# Patient Record
Sex: Female | Born: 1940 | Race: White | Hispanic: No | Marital: Married | Smoking: Never smoker
Health system: Southern US, Community
[De-identification: ages and names within clinical notes are randomized; demographics above are authoritative.]

## PROBLEM LIST (undated history)

## (undated) DIAGNOSIS — D3A098 Benign carcinoid tumors of other sites: Secondary | ICD-10-CM

## (undated) DIAGNOSIS — T7840XA Allergy, unspecified, initial encounter: Secondary | ICD-10-CM

## (undated) DIAGNOSIS — G43909 Migraine, unspecified, not intractable, without status migrainosus: Secondary | ICD-10-CM

## (undated) DIAGNOSIS — E785 Hyperlipidemia, unspecified: Secondary | ICD-10-CM

## (undated) DIAGNOSIS — I10 Essential (primary) hypertension: Secondary | ICD-10-CM

## (undated) DIAGNOSIS — M1990 Unspecified osteoarthritis, unspecified site: Secondary | ICD-10-CM

## (undated) HISTORY — PX: CHOLECYSTECTOMY: SHX55

## (undated) HISTORY — DX: Unspecified osteoarthritis, unspecified site: M19.90

## (undated) HISTORY — DX: Hyperlipidemia, unspecified: E78.5

## (undated) HISTORY — DX: Allergy, unspecified, initial encounter: T78.40XA

## (undated) HISTORY — DX: Migraine, unspecified, not intractable, without status migrainosus: G43.909

## (undated) HISTORY — DX: Essential (primary) hypertension: I10

## (undated) HISTORY — DX: Benign carcinoid tumors of other sites: D3A.098

## (undated) HISTORY — PX: TUBAL LIGATION: SHX77

---

## 1998-10-05 ENCOUNTER — Other Ambulatory Visit: Admission: RE | Admit: 1998-10-05 | Discharge: 1998-10-05 | Payer: Self-pay | Admitting: *Deleted

## 1999-09-14 ENCOUNTER — Other Ambulatory Visit: Admission: RE | Admit: 1999-09-14 | Discharge: 1999-09-14 | Payer: Self-pay | Admitting: Family Medicine

## 2000-04-04 ENCOUNTER — Encounter: Payer: Self-pay | Admitting: Family Medicine

## 2000-04-04 ENCOUNTER — Encounter: Admission: RE | Admit: 2000-04-04 | Discharge: 2000-04-04 | Payer: Self-pay | Admitting: Family Medicine

## 2000-12-24 ENCOUNTER — Other Ambulatory Visit: Admission: RE | Admit: 2000-12-24 | Discharge: 2000-12-24 | Payer: Self-pay | Admitting: Obstetrics and Gynecology

## 2001-08-09 ENCOUNTER — Ambulatory Visit (HOSPITAL_COMMUNITY): Admission: RE | Admit: 2001-08-09 | Discharge: 2001-08-09 | Payer: Self-pay | Admitting: Family Medicine

## 2001-08-09 ENCOUNTER — Encounter: Payer: Self-pay | Admitting: Family Medicine

## 2001-09-06 ENCOUNTER — Encounter: Payer: Self-pay | Admitting: Gastroenterology

## 2001-09-06 ENCOUNTER — Ambulatory Visit (HOSPITAL_COMMUNITY): Admission: RE | Admit: 2001-09-06 | Discharge: 2001-09-06 | Payer: Self-pay | Admitting: Gastroenterology

## 2001-10-21 ENCOUNTER — Ambulatory Visit (HOSPITAL_COMMUNITY): Admission: RE | Admit: 2001-10-21 | Discharge: 2001-10-21 | Payer: Self-pay | Admitting: Gastroenterology

## 2001-10-21 ENCOUNTER — Encounter (INDEPENDENT_AMBULATORY_CARE_PROVIDER_SITE_OTHER): Payer: Self-pay | Admitting: Specialist

## 2001-12-20 ENCOUNTER — Encounter: Payer: Self-pay | Admitting: Surgery

## 2002-01-06 ENCOUNTER — Encounter (INDEPENDENT_AMBULATORY_CARE_PROVIDER_SITE_OTHER): Payer: Self-pay

## 2002-01-06 ENCOUNTER — Observation Stay (HOSPITAL_COMMUNITY): Admission: RE | Admit: 2002-01-06 | Discharge: 2002-01-07 | Payer: Self-pay | Admitting: Surgery

## 2002-01-29 ENCOUNTER — Encounter: Payer: Self-pay | Admitting: Surgery

## 2002-01-29 ENCOUNTER — Ambulatory Visit (HOSPITAL_COMMUNITY): Admission: RE | Admit: 2002-01-29 | Discharge: 2002-01-29 | Payer: Self-pay | Admitting: Surgery

## 2002-02-04 ENCOUNTER — Encounter (INDEPENDENT_AMBULATORY_CARE_PROVIDER_SITE_OTHER): Payer: Self-pay | Admitting: Specialist

## 2002-02-04 ENCOUNTER — Inpatient Hospital Stay (HOSPITAL_COMMUNITY): Admission: RE | Admit: 2002-02-04 | Discharge: 2002-02-08 | Payer: Self-pay | Admitting: Surgery

## 2002-05-30 ENCOUNTER — Other Ambulatory Visit: Admission: RE | Admit: 2002-05-30 | Discharge: 2002-05-30 | Payer: Self-pay | Admitting: Obstetrics & Gynecology

## 2002-07-12 ENCOUNTER — Emergency Department (HOSPITAL_COMMUNITY): Admission: EM | Admit: 2002-07-12 | Discharge: 2002-07-12 | Payer: Self-pay | Admitting: Emergency Medicine

## 2002-09-03 ENCOUNTER — Encounter: Payer: Self-pay | Admitting: Surgery

## 2002-09-03 ENCOUNTER — Encounter: Admission: RE | Admit: 2002-09-03 | Discharge: 2002-09-03 | Payer: Self-pay | Admitting: Surgery

## 2003-10-06 ENCOUNTER — Ambulatory Visit (HOSPITAL_COMMUNITY): Admission: RE | Admit: 2003-10-06 | Discharge: 2003-10-06 | Payer: Self-pay | Admitting: Family Medicine

## 2003-10-12 ENCOUNTER — Ambulatory Visit (HOSPITAL_COMMUNITY): Admission: RE | Admit: 2003-10-12 | Discharge: 2003-10-12 | Payer: Self-pay | Admitting: Surgery

## 2003-10-13 ENCOUNTER — Emergency Department (HOSPITAL_COMMUNITY): Admission: EM | Admit: 2003-10-13 | Discharge: 2003-10-13 | Payer: Self-pay | Admitting: Emergency Medicine

## 2004-09-12 ENCOUNTER — Ambulatory Visit (HOSPITAL_COMMUNITY): Admission: RE | Admit: 2004-09-12 | Discharge: 2004-09-12 | Payer: Self-pay | Admitting: Neurology

## 2004-09-13 ENCOUNTER — Ambulatory Visit (HOSPITAL_COMMUNITY): Admission: RE | Admit: 2004-09-13 | Discharge: 2004-09-13 | Payer: Self-pay | Admitting: Neurology

## 2004-09-13 ENCOUNTER — Encounter (INDEPENDENT_AMBULATORY_CARE_PROVIDER_SITE_OTHER): Payer: Self-pay | Admitting: *Deleted

## 2004-09-17 ENCOUNTER — Emergency Department (HOSPITAL_COMMUNITY): Admission: EM | Admit: 2004-09-17 | Discharge: 2004-09-17 | Payer: Self-pay | Admitting: Emergency Medicine

## 2004-09-20 ENCOUNTER — Emergency Department (HOSPITAL_COMMUNITY): Admission: EM | Admit: 2004-09-20 | Discharge: 2004-09-20 | Payer: Self-pay | Admitting: Emergency Medicine

## 2004-09-21 ENCOUNTER — Ambulatory Visit: Payer: Self-pay | Admitting: Family Medicine

## 2004-09-22 ENCOUNTER — Ambulatory Visit: Payer: Self-pay | Admitting: Family Medicine

## 2004-10-21 ENCOUNTER — Ambulatory Visit: Payer: Self-pay | Admitting: Family Medicine

## 2004-10-26 ENCOUNTER — Ambulatory Visit: Payer: Self-pay | Admitting: Family Medicine

## 2004-11-11 ENCOUNTER — Emergency Department (HOSPITAL_COMMUNITY): Admission: EM | Admit: 2004-11-11 | Discharge: 2004-11-11 | Payer: Self-pay | Admitting: Emergency Medicine

## 2004-11-22 ENCOUNTER — Ambulatory Visit: Payer: Self-pay | Admitting: Family Medicine

## 2004-11-30 ENCOUNTER — Encounter: Admission: RE | Admit: 2004-11-30 | Discharge: 2004-11-30 | Payer: Self-pay | Admitting: Family Medicine

## 2004-12-23 ENCOUNTER — Ambulatory Visit: Payer: Self-pay | Admitting: Family Medicine

## 2005-01-23 ENCOUNTER — Ambulatory Visit: Payer: Self-pay | Admitting: Family Medicine

## 2005-02-21 ENCOUNTER — Ambulatory Visit: Payer: Self-pay | Admitting: Family Medicine

## 2005-03-10 ENCOUNTER — Ambulatory Visit: Payer: Self-pay | Admitting: Family Medicine

## 2005-03-27 ENCOUNTER — Ambulatory Visit: Payer: Self-pay | Admitting: Family Medicine

## 2005-04-28 ENCOUNTER — Ambulatory Visit: Payer: Self-pay | Admitting: Family Medicine

## 2005-05-03 ENCOUNTER — Ambulatory Visit: Payer: Self-pay | Admitting: Family Medicine

## 2005-06-05 ENCOUNTER — Ambulatory Visit: Payer: Self-pay | Admitting: Family Medicine

## 2005-06-19 ENCOUNTER — Ambulatory Visit: Payer: Self-pay | Admitting: Family Medicine

## 2005-07-06 ENCOUNTER — Ambulatory Visit: Payer: Self-pay | Admitting: Family Medicine

## 2005-08-07 ENCOUNTER — Ambulatory Visit: Payer: Self-pay | Admitting: Family Medicine

## 2005-09-08 ENCOUNTER — Ambulatory Visit: Payer: Self-pay | Admitting: Family Medicine

## 2005-10-09 ENCOUNTER — Ambulatory Visit: Payer: Self-pay | Admitting: Family Medicine

## 2005-11-06 LAB — HM COLONOSCOPY

## 2005-11-10 ENCOUNTER — Ambulatory Visit: Payer: Self-pay | Admitting: Family Medicine

## 2005-11-30 ENCOUNTER — Ambulatory Visit: Payer: Self-pay | Admitting: Family Medicine

## 2005-12-01 ENCOUNTER — Encounter: Admission: RE | Admit: 2005-12-01 | Discharge: 2005-12-01 | Payer: Self-pay | Admitting: Family Medicine

## 2005-12-11 ENCOUNTER — Ambulatory Visit: Payer: Self-pay | Admitting: Family Medicine

## 2006-01-04 ENCOUNTER — Ambulatory Visit: Payer: Self-pay | Admitting: Family Medicine

## 2006-01-08 ENCOUNTER — Ambulatory Visit: Payer: Self-pay | Admitting: Family Medicine

## 2006-01-29 ENCOUNTER — Ambulatory Visit: Payer: Self-pay | Admitting: Family Medicine

## 2006-02-12 ENCOUNTER — Ambulatory Visit: Payer: Self-pay | Admitting: Family Medicine

## 2006-03-23 ENCOUNTER — Ambulatory Visit: Payer: Self-pay | Admitting: Family Medicine

## 2006-04-05 ENCOUNTER — Ambulatory Visit: Payer: Self-pay | Admitting: Family Medicine

## 2006-04-23 ENCOUNTER — Ambulatory Visit: Payer: Self-pay | Admitting: Family Medicine

## 2006-05-25 ENCOUNTER — Ambulatory Visit: Payer: Self-pay | Admitting: Family Medicine

## 2006-06-25 ENCOUNTER — Ambulatory Visit: Payer: Self-pay | Admitting: Family Medicine

## 2006-06-28 ENCOUNTER — Ambulatory Visit: Payer: Self-pay | Admitting: Family Medicine

## 2006-07-16 ENCOUNTER — Ambulatory Visit: Payer: Self-pay | Admitting: Family Medicine

## 2006-07-17 ENCOUNTER — Ambulatory Visit (HOSPITAL_COMMUNITY): Admission: RE | Admit: 2006-07-17 | Discharge: 2006-07-17 | Payer: Self-pay | Admitting: Family Medicine

## 2006-07-30 ENCOUNTER — Ambulatory Visit: Payer: Self-pay | Admitting: Family Medicine

## 2006-08-27 ENCOUNTER — Ambulatory Visit: Payer: Self-pay | Admitting: Family Medicine

## 2006-09-03 ENCOUNTER — Ambulatory Visit: Payer: Self-pay | Admitting: Family Medicine

## 2006-10-08 ENCOUNTER — Ambulatory Visit: Payer: Self-pay | Admitting: Family Medicine

## 2006-11-06 LAB — CONVERTED CEMR LAB: Pap Smear: NORMAL

## 2006-11-30 ENCOUNTER — Encounter: Admission: RE | Admit: 2006-11-30 | Discharge: 2006-11-30 | Payer: Self-pay | Admitting: Family Medicine

## 2006-12-17 ENCOUNTER — Ambulatory Visit: Payer: Self-pay | Admitting: Family Medicine

## 2006-12-22 ENCOUNTER — Emergency Department (HOSPITAL_COMMUNITY): Admission: EM | Admit: 2006-12-22 | Discharge: 2006-12-22 | Payer: Self-pay | Admitting: Emergency Medicine

## 2007-01-22 ENCOUNTER — Ambulatory Visit: Payer: Self-pay | Admitting: Family Medicine

## 2007-02-22 ENCOUNTER — Ambulatory Visit: Payer: Self-pay | Admitting: Family Medicine

## 2007-03-25 ENCOUNTER — Ambulatory Visit: Payer: Self-pay | Admitting: Family Medicine

## 2007-03-28 ENCOUNTER — Encounter: Admission: RE | Admit: 2007-03-28 | Discharge: 2007-03-28 | Payer: Self-pay | Admitting: Nephrology

## 2007-04-05 ENCOUNTER — Emergency Department (HOSPITAL_COMMUNITY): Admission: EM | Admit: 2007-04-05 | Discharge: 2007-04-05 | Payer: Self-pay | Admitting: Emergency Medicine

## 2007-04-08 ENCOUNTER — Ambulatory Visit: Payer: Self-pay | Admitting: Physician Assistant

## 2007-04-19 ENCOUNTER — Emergency Department (HOSPITAL_COMMUNITY): Admission: EM | Admit: 2007-04-19 | Discharge: 2007-04-19 | Payer: Self-pay | Admitting: Emergency Medicine

## 2008-01-13 ENCOUNTER — Encounter: Admission: RE | Admit: 2008-01-13 | Discharge: 2008-01-13 | Payer: Self-pay | Admitting: Family Medicine

## 2008-03-31 ENCOUNTER — Emergency Department (HOSPITAL_COMMUNITY): Admission: EM | Admit: 2008-03-31 | Discharge: 2008-03-31 | Payer: Self-pay | Admitting: Emergency Medicine

## 2008-12-11 ENCOUNTER — Observation Stay (HOSPITAL_COMMUNITY): Admission: EM | Admit: 2008-12-11 | Discharge: 2008-12-12 | Payer: Self-pay | Admitting: Emergency Medicine

## 2009-02-02 ENCOUNTER — Encounter: Admission: RE | Admit: 2009-02-02 | Discharge: 2009-02-02 | Payer: Self-pay | Admitting: Family Medicine

## 2009-02-25 ENCOUNTER — Ambulatory Visit: Payer: Self-pay | Admitting: Family Medicine

## 2009-02-25 DIAGNOSIS — E119 Type 2 diabetes mellitus without complications: Secondary | ICD-10-CM | POA: Insufficient documentation

## 2009-02-25 DIAGNOSIS — I10 Essential (primary) hypertension: Secondary | ICD-10-CM | POA: Insufficient documentation

## 2009-02-25 DIAGNOSIS — J309 Allergic rhinitis, unspecified: Secondary | ICD-10-CM | POA: Insufficient documentation

## 2009-03-02 ENCOUNTER — Telehealth: Payer: Self-pay | Admitting: Family Medicine

## 2009-03-08 ENCOUNTER — Ambulatory Visit: Payer: Self-pay | Admitting: Family Medicine

## 2009-03-15 ENCOUNTER — Telehealth: Payer: Self-pay | Admitting: Family Medicine

## 2009-03-17 ENCOUNTER — Encounter: Payer: Self-pay | Admitting: Family Medicine

## 2009-03-19 ENCOUNTER — Telehealth: Payer: Self-pay | Admitting: Family Medicine

## 2009-03-25 ENCOUNTER — Ambulatory Visit: Payer: Self-pay | Admitting: Family Medicine

## 2009-03-25 DIAGNOSIS — Z862 Personal history of diseases of the blood and blood-forming organs and certain disorders involving the immune mechanism: Secondary | ICD-10-CM | POA: Insufficient documentation

## 2009-03-29 LAB — CONVERTED CEMR LAB
Basophils Absolute: 0 10*3/uL (ref 0.0–0.1)
Basophils Relative: 0.4 % (ref 0.0–3.0)
Eosinophils Absolute: 0.4 10*3/uL (ref 0.0–0.7)
Eosinophils Relative: 5.5 % — ABNORMAL HIGH (ref 0.0–5.0)
HCT: 35.5 % — ABNORMAL LOW (ref 36.0–46.0)
Hemoglobin: 12.1 g/dL (ref 12.0–15.0)
Hgb A1c MFr Bld: 7.1 % — ABNORMAL HIGH (ref 4.6–6.5)
Lymphocytes Relative: 39.4 % (ref 12.0–46.0)
Lymphs Abs: 2.8 10*3/uL (ref 0.7–4.0)
MCHC: 34.1 g/dL (ref 30.0–36.0)
MCV: 89.1 fL (ref 78.0–100.0)
Monocytes Absolute: 0.5 10*3/uL (ref 0.1–1.0)
Monocytes Relative: 7.7 % (ref 3.0–12.0)
Neutro Abs: 3.4 10*3/uL (ref 1.4–7.7)
Neutrophils Relative %: 47 % (ref 43.0–77.0)
Platelets: 199 10*3/uL (ref 150.0–400.0)
RBC: 3.98 M/uL (ref 3.87–5.11)
RDW: 13 % (ref 11.5–14.6)
Vitamin B-12: >1500 pg/mL — ABNORMAL HIGH (ref 211–911)
WBC: 7.1 10*3/uL (ref 4.5–10.5)

## 2009-04-01 ENCOUNTER — Telehealth: Payer: Self-pay | Admitting: Family Medicine

## 2009-04-06 ENCOUNTER — Telehealth: Payer: Self-pay | Admitting: Family Medicine

## 2009-04-23 ENCOUNTER — Ambulatory Visit: Payer: Self-pay | Admitting: Family Medicine

## 2009-07-30 ENCOUNTER — Ambulatory Visit: Payer: Self-pay | Admitting: Family Medicine

## 2009-07-30 LAB — CONVERTED CEMR LAB: Hgb A1c MFr Bld: 6.7 % — ABNORMAL HIGH (ref 4.6–6.5)

## 2009-09-27 ENCOUNTER — Encounter (INDEPENDENT_AMBULATORY_CARE_PROVIDER_SITE_OTHER): Payer: Self-pay | Admitting: *Deleted

## 2009-10-27 ENCOUNTER — Ambulatory Visit: Payer: Self-pay | Admitting: Family Medicine

## 2009-10-27 DIAGNOSIS — E785 Hyperlipidemia, unspecified: Secondary | ICD-10-CM | POA: Insufficient documentation

## 2009-10-27 DIAGNOSIS — Z8659 Personal history of other mental and behavioral disorders: Secondary | ICD-10-CM | POA: Insufficient documentation

## 2009-11-12 ENCOUNTER — Telehealth: Payer: Self-pay | Admitting: Internal Medicine

## 2009-11-26 ENCOUNTER — Ambulatory Visit: Payer: Self-pay | Admitting: Family Medicine

## 2010-02-11 ENCOUNTER — Encounter: Admission: RE | Admit: 2010-02-11 | Discharge: 2010-02-11 | Payer: Self-pay | Admitting: Family Medicine

## 2010-02-11 LAB — HM MAMMOGRAPHY

## 2010-03-18 ENCOUNTER — Telehealth: Payer: Self-pay | Admitting: Family Medicine

## 2010-03-25 ENCOUNTER — Ambulatory Visit: Payer: Self-pay | Admitting: Family Medicine

## 2010-03-28 LAB — CONVERTED CEMR LAB
ALT: 21 units/L (ref 0–35)
AST: 20 units/L (ref 0–37)
Albumin: 3.5 g/dL (ref 3.5–5.2)
Alkaline Phosphatase: 79 units/L (ref 39–117)
BUN: 20 mg/dL (ref 6–23)
Bilirubin, Direct: 0 mg/dL (ref 0.0–0.3)
CO2: 30 meq/L (ref 19–32)
Calcium: 8.8 mg/dL (ref 8.4–10.5)
Chloride: 104 meq/L (ref 96–112)
Cholesterol: 282 mg/dL — ABNORMAL HIGH (ref 0–200)
Creatinine, Ser: 0.9 mg/dL (ref 0.4–1.2)
Direct LDL: 197.9 mg/dL
GFR calc non Af Amer: 67.01 mL/min (ref >60)
Glucose, Bld: 176 mg/dL — ABNORMAL HIGH (ref 70–99)
HDL: 38.1 mg/dL — ABNORMAL LOW (ref >39.00)
Hgb A1c MFr Bld: 7.2 % — ABNORMAL HIGH (ref 4.6–6.5)
Potassium: 4.5 meq/L (ref 3.5–5.1)
Sodium: 141 meq/L (ref 135–145)
Total Bilirubin: 0.5 mg/dL (ref 0.3–1.2)
Total CHOL/HDL Ratio: 7
Total Protein: 6.5 g/dL (ref 6.0–8.3)
Triglycerides: 364 mg/dL — ABNORMAL HIGH (ref 0.0–149.0)
VLDL: 72.8 mg/dL — ABNORMAL HIGH (ref 0.0–40.0)
Vitamin B-12: >1500 pg/mL — ABNORMAL HIGH (ref 211–911)

## 2010-04-01 ENCOUNTER — Ambulatory Visit: Payer: Self-pay | Admitting: Family Medicine

## 2010-04-01 LAB — CONVERTED CEMR LAB
Cholesterol, target level: 200 mg/dL
HDL goal, serum: 40 mg/dL
LDL Goal: 100 mg/dL

## 2010-04-04 ENCOUNTER — Emergency Department (HOSPITAL_COMMUNITY): Admission: EM | Admit: 2010-04-04 | Discharge: 2010-04-04 | Payer: Self-pay | Admitting: Emergency Medicine

## 2010-05-06 ENCOUNTER — Ambulatory Visit: Payer: Self-pay | Admitting: Family Medicine

## 2010-05-06 LAB — HM DIABETES EYE EXAM

## 2010-06-06 ENCOUNTER — Ambulatory Visit: Payer: Self-pay | Admitting: Family Medicine

## 2010-06-08 LAB — CONVERTED CEMR LAB
ALT: 19 units/L (ref 0–35)
AST: 20 units/L (ref 0–37)
Albumin: 3.6 g/dL (ref 3.5–5.2)
Alkaline Phosphatase: 74 units/L (ref 39–117)
Bilirubin, Direct: 0.1 mg/dL (ref 0.0–0.3)
Cholesterol: 166 mg/dL (ref 0–200)
Direct LDL: 97.4 mg/dL
HDL: 38 mg/dL — ABNORMAL LOW (ref >39.00)
Hgb A1c MFr Bld: 7.2 % — ABNORMAL HIGH (ref 4.6–6.5)
Total Bilirubin: 0.7 mg/dL (ref 0.3–1.2)
Total CHOL/HDL Ratio: 4
Total Protein: 6.5 g/dL (ref 6.0–8.3)
Triglycerides: 212 mg/dL — ABNORMAL HIGH (ref 0.0–149.0)
VLDL: 42.4 mg/dL — ABNORMAL HIGH (ref 0.0–40.0)

## 2010-06-16 ENCOUNTER — Ambulatory Visit: Payer: Self-pay | Admitting: Family Medicine

## 2010-06-16 DIAGNOSIS — L600 Ingrowing nail: Secondary | ICD-10-CM | POA: Insufficient documentation

## 2010-09-09 ENCOUNTER — Ambulatory Visit: Payer: Self-pay | Admitting: Family Medicine

## 2010-09-09 LAB — HM DIABETES FOOT EXAM

## 2010-10-06 ENCOUNTER — Telehealth: Payer: Self-pay | Admitting: Family Medicine

## 2010-11-21 ENCOUNTER — Telehealth: Payer: Self-pay | Admitting: Family Medicine

## 2010-11-25 ENCOUNTER — Other Ambulatory Visit: Payer: Self-pay | Admitting: Family Medicine

## 2010-11-25 ENCOUNTER — Ambulatory Visit
Admission: RE | Admit: 2010-11-25 | Discharge: 2010-11-25 | Payer: Self-pay | Source: Home / Self Care | Attending: Family Medicine | Admitting: Family Medicine

## 2010-11-25 LAB — HEMOGLOBIN A1C: Hgb A1c MFr Bld: 6.8 % — ABNORMAL HIGH (ref 4.6–6.5)

## 2010-11-26 ENCOUNTER — Encounter: Payer: Self-pay | Admitting: Family Medicine

## 2010-12-08 NOTE — Assessment & Plan Note (Signed)
Summary: excise toenail/dm   Vital Signs:  Patient profile:   70 year old female Menstrual status:  postmenopausal Height:      64 inches Weight:      232 pounds BMI:     39.97 Temp:     98.1 degrees F oral Pulse rate:   78 / minute BP sitting:   150 / 80  (left arm) Cuff size:   regular  Vitals Entered By: Sherron Monday, CMA (AAMA) (June 16, 2010 10:57 AM) CC: excise left big toenail   History of Present Illness: Recurrent ingrown toenail. This was partially excised several months ago and she did well until recently. She has not had any redness or drainage. She's had prior history of ingrown toenail involving same toe several years ago. She would like to have reexcised this time. She has pain with shoes such as tennis shoes.  Type 2 diabetes which has been improved with recent modification of medication.  Preventive Screening-Counseling & Management  Alcohol-Tobacco     Smoking Status: never  Caffeine-Diet-Exercise     Does Patient Exercise: no  Current Medications (verified): 1)  Glipizide 2.5 Mg Xr24h-Tab (Glipizide) .... Once Daily 2)  Lisinopril 40 Mg Tabs (Lisinopril) .... One By Mouth Once Daily 3)  Metformin Hcl 500 Mg Tabs (Metformin Hcl) .... Two By Mouth Q Am and One By Mouth Q Pm 4)  Hydrochlorothiazide 25 Mg Tabs (Hydrochlorothiazide) .... 1/2 Tab Daily 5)  Felodipine 5 Mg Xr24h-Tab (Felodipine) .... Once Daily 6)  Lipitor 20 Mg Tabs (Atorvastatin Calcium) .... One By Mouth Once Daily 7)  Fish Oil 1200 Mg Caps (Omega-3 Fatty Acids) .... Once Daily 8)  Aspirin Adult Low Strength 81 Mg Tbec (Aspirin) .... Once Daily 9)  Multi For Her 50+  Tabs (Multiple Vitamins-Minerals) .... Once Daily 10)  Calcium 600 1500 Mg Tabs (Calcium Carbonate) .... Once Daily 11)  Vitamin B-12 1000 Mcg Subl (Cyanocobalamin) .... Once Daily 12)  Fexofenadine Hcl 180 Mg Tabs (Fexofenadine Hcl) .... As Needed 13)  Fluticasone Propionate 50 Mcg/act Susp (Fluticasone Propionate)  .... 2 Sprays Per Nostril As Needed 14)  Carvedilol 6.25 Mg Tabs (Carvedilol) .... One By Mouth Two Times A Day 15)  Zoloft 50 Mg Tabs (Sertraline Hcl) .... Once Daily 16)  Vitamin E 1000 Unit Caps (Vitamin E) .... Once Daily 17)  Diclofenac Sodium 75 Mg Tbec (Diclofenac Sodium) .... One By Mouth Two Times A Day As Needed Pain  Allergies (verified): 1)  ! 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 Lungs are clear to auscultation, no crackles or wheezes. Heart:  normal rate and regular rhythm.   Extremities:  right great toe reveals along the medial border she has ingrown nail but no granulation tissue, erythema ,or drainage. Mildly tender to palpation   Impression & Recommendations:  Problem # 1:  INGROWN TOENAIL (ICD-703.0) reviewed risks and benefits of partial nail excision following digital block and pt consented.  Digital block with 1%plain xylocaine.  Prepped toe with betadine and loosened nail with straight hemostats and excised with surgical scissors without difficulty.  No signif bleeding.  Topical ab applied.  Dressing applied.  Will rec refer to  podiatrist for consideration of nail matrex ablation if recurs again. Orders: Nail avulsion, partial or complete ([id number])  Complete Medication List: 1)  Glipizide 2.5 Mg Xr24h-tab (Glipizide) .... Once daily 2)  Lisinopril 40 Mg Tabs (Lisinopril) .... One by mouth once daily 3)  Metformin Hcl 500 Mg Tabs (Metformin hcl) .... Two by mouth q am and one by mouth q pm 4)  Hydrochlorothiazide 25 Mg Tabs (Hydrochlorothiazide)  .... 1/2 tab daily 5)  Felodipine 5 Mg Xr24h-tab (Felodipine) .... Once daily 6)  Lipitor 20 Mg Tabs (Atorvastatin calcium) .... One by mouth once daily 7)  Fish Oil 1200 Mg Caps (Omega-3 fatty acids) .... Once daily 8)  Aspirin Adult Low Strength 81 Mg Tbec (Aspirin) .... Once daily 9)  Multi For Her 50+ Tabs (Multiple vitamins-minerals) .... Once daily 10)  Calcium 600 1500 Mg Tabs (Calcium carbonate) .... Once daily 11)  Vitamin B-12 1000 Mcg Subl (Cyanocobalamin) .... Once daily 12)  Fexofenadine Hcl 180 Mg Tabs (Fexofenadine hcl) .... As needed 13)  Fluticasone Propionate 50 Mcg/act Susp (Fluticasone propionate) .... 2 sprays per nostril as needed 14)  Carvedilol 6.25 Mg Tabs (Carvedilol) .... One by mouth two times a day 15)  Zoloft 50 Mg Tabs (Sertraline hcl) .... Once daily 16)  Vitamin E 1000 Unit Caps (Vitamin e) .... Once daily 17)  Diclofenac Sodium 75 Mg Tbec (Diclofenac sodium) .... One by mouth two times a day as needed pain  Patient Instructions: 1)  Keep toe dry for the first 24 hours then clean with soap and water. 2)  Topical antibiotic for 3-4 days. 3)  Follow up promptly for any redness, swelling , or pus-like drainage.

## 2010-12-08 NOTE — Progress Notes (Signed)
Summary: Metformin Refill request  Phone Note From Pharmacy   Caller: [address]* Call For: Charls Custer  Summary of Call: [facility] requesting refill of Metformin 500mg , #180, one tab two times a day Rx sent electronically Initial call taken by: Nira Conn LPN,  May 27, 624THL [id number] AM      Prescriptions: METFORMIN HCL 500 MG TABS (METFORMIN HCL) two times a day  #180 x 3   Entered by:   Nira Conn LPN   Authorized by:   Carolann Littler MD   Signed by:   Nira Conn LPN on [id number]   Method used:   Electronically to        [facility] (retail)       [address]       [place], [place]  [address]       Ph: [phone]       Fax: [phone]   RxID:   [id number]

## 2010-12-08 NOTE — Progress Notes (Signed)
Summary: REFILL REQUEST Carvedilol X 1 year  Phone Note Refill Request Message from:  Patient  Refills Requested: Medication #1:  CARVEDILOL 6.25 MG TABS one by mouth two times a day   Notes: [place].    Initial call taken by: Duanne Moron,  October 06, 2010 9:56 AM    Prescriptions: CARVEDILOL 6.25 MG TABS (CARVEDILOL) one by mouth two times a day  #180 x 3   Entered by:   Nira Conn LPN   Authorized by:   Carolann Littler MD   Signed by:   Nira Conn LPN on [id number]   Method used:   Electronically to        [facility] (retail)       [address]       [place], [place]  [address]       Ph: [phone]       Fax: [phone]   RxID:   [id number]

## 2010-12-08 NOTE — Progress Notes (Signed)
Summary: REQUEST FOR ADDITIONAL LABWORK??  Phone Note Call from Patient   Caller: Patient  [phone] Summary of Call: Pt wants to know if she should have her cholesterol checked... has lab appt this Friday, 1/20 for HGBA1C....Can you advise?  Initial call taken by: Duanne Moron,  November 21, 2010 8:31 AM  Follow-up for Phone Call        just checked this in August.  Happy to add but see no real reason this soon Follow-up by: Carolann Littler MD,  November 21, 2010 1:25 PM  Additional Follow-up for Phone Call Additional follow up Details #1::        Pt informed via v/m.  Additional Follow-up by: Duanne Moron,  November 21, 2010 2:11 PM

## 2010-12-08 NOTE — Assessment & Plan Note (Signed)
Summary: 6 wk rov/njr   Vital Signs:  Patient profile:   70 year old female Menstrual status:  postmenopausal Weight:      232 pounds Temp:     98 degrees F oral BP sitting:   138 / 76  (right arm) Cuff size:   large  Vitals Entered By: Nira Conn LPN (July  1, 624THL [id number] AM) CC: 6 week follow-up BP med increase   History of Present Illness: Here for follow up several issues:  Sacroiliac pain.  Had x-rays at another facility and no fracture seen.  Pain is slowly improving.  Suboptimal diabetes control.  Increased metformin and no side effects.  CBGs not checked regularly at home.  No hypoglycemia.  A1C 7.2%.  Hypertension not controlled.  Increased lisinopril and BPs have improved since then.  No dizziness, orthostasis, palpitations, or chest pain.  Hyperlipidemia poorly controlled.  Changed back to Lipitor and taking with no adverse side effects.  Clinical Review Panels:  Diabetes Management   HgBA1C:  7.2 (03/25/2010)   Creatinine:  0.9 (03/25/2010)   Last Dilated Eye Exam:  normal (05/06/2010)   Last Foot Exam:  yes (05/06/2010)   Last Flu Vaccine:  Historical (08/06/2009)   Last Pneumovax:  Pneumovax (02/25/2009)   Current Medications (verified): 1)  Glipizide 2.5 Mg Xr24h-Tab (Glipizide) .... Once Daily 2)  Lisinopril 40 Mg Tabs (Lisinopril) .... One By Mouth Once Daily 3)  Metformin Hcl 500 Mg Tabs (Metformin Hcl) .... Two By Mouth Q Am and One By Mouth Q Pm 4)  Hydrochlorothiazide 25 Mg Tabs (Hydrochlorothiazide) .... 1/2 Tab Daily 5)  Felodipine 5 Mg Xr24h-Tab (Felodipine) .... Once Daily 6)  Lipitor 20 Mg Tabs (Atorvastatin Calcium) .... One By Mouth Once Daily 7)  Fish Oil 1200 Mg Caps (Omega-3 Fatty Acids) .... Once Daily 8)  Aspirin Adult Low Strength 81 Mg Tbec (Aspirin) .... Once Daily 9)  Multi For Her 50+  Tabs (Multiple Vitamins-Minerals) .... Once Daily 10)  Calcium 600 1500 Mg Tabs (Calcium Carbonate) .... Once Daily 11)  Vitamin B-12 1000 Mcg  Subl (Cyanocobalamin) .... Once Daily 12)  Fexofenadine Hcl 180 Mg Tabs (Fexofenadine Hcl) .... As Needed 13)  Fluticasone Propionate 50 Mcg/act Susp (Fluticasone Propionate) .... 2 Sprays Per Nostril As Needed 14)  Carvedilol 6.25 Mg Tabs (Carvedilol) .... One By Mouth Two Times A Day 15)  Zoloft 50 Mg Tabs (Sertraline Hcl) .... Once Daily 16)  Vitamin E 1000 Unit Caps (Vitamin E) .... Once Daily 17)  Diclofenac Sodium 75 Mg Tbec (Diclofenac Sodium) .... One By Mouth Two Times A Day As Needed Pain  Allergies: 1)  ! 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  Teeth in good repair. Neck:  No deformities, masses, or tenderness noted. Lungs:  Normal respiratory effort, chest expands symmetrically. Lungs are clear to auscultation, no crackles or wheezes. Heart:  normal rate and regular rhythm.   Extremities:  no edema.  Diabetes Management Exam:    Foot Exam (with socks and/or shoes not present):       Sensory-Pinprick/Light touch:          Left medial foot (L-4): normal  Left dorsal foot (L-5): normal          Left lateral foot (S-1): normal          Right medial foot (L-4): normal          Right dorsal foot (L-5): normal          Right lateral foot (S-1): normal       Sensory-Monofilament:          Left foot: normal          Right foot: normal       Inspection:          Left foot: normal          Right foot: normal       Nails:          Left foot: normal          Right foot: normal    Eye Exam:       Eye Exam done here today          Results:  normal   Impression & Recommendations:  Problem # 1:  HYPERTENSION (ICD-401.9) Assessment Improved  Her updated medication list for this problem includes:    Lisinopril 40 Mg Tabs (Lisinopril) ..... One by mouth once daily    Hydrochlorothiazide 25 Mg Tabs (Hydrochlorothiazide) .Marland Kitchen... 1/2 tab daily    Felodipine 5 Mg Xr24h-tab (Felodipine) ..... Once daily    Carvedilol 6.25 Mg Tabs (Carvedilol) ..... One by mouth two times a day  Problem # 2:  HYPERLIPIDEMIA (ICD-272.4) reassess in one month Her updated medication list for this problem includes:    Lipitor 20 Mg Tabs (Atorvastatin calcium) ..... One by mouth once daily  Problem # 3:  SACROILIAC STRAIN (ICD-846.9) Assessment: Improved  Problem # 4:  DIABETES MELLITUS, TYPE II (ICD-250.00) recheck A1C at follow up. Her updated medication list for this problem includes:    Glipizide 2.5 Mg Xr24h-tab (Glipizide) ..... Once daily    Lisinopril 40 Mg Tabs (Lisinopril) ..... One by mouth once daily    Metformin Hcl 500 Mg Tabs (Metformin hcl) .Marland Kitchen..Marland Kitchen Two by mouth q am and one by mouth q pm    Aspirin Adult Low Strength 81 Mg Tbec (Aspirin) ..... Once daily  Complete Medication List: 1)  Glipizide 2.5 Mg Xr24h-tab (Glipizide) .... Once daily 2)  Lisinopril 40 Mg Tabs (Lisinopril) .... One by mouth once daily 3)  Metformin Hcl 500 Mg Tabs (Metformin hcl) .... Two by mouth q am and one by mouth q pm 4)  Hydrochlorothiazide 25 Mg Tabs (Hydrochlorothiazide) .... 1/2 tab daily 5)  Felodipine 5 Mg Xr24h-tab (Felodipine) .... Once daily 6)  Lipitor 20 Mg Tabs (Atorvastatin calcium) .... One by mouth once daily 7)  Fish Oil 1200 Mg Caps (Omega-3 fatty acids) .... Once daily 8)  Aspirin Adult Low Strength 81 Mg Tbec (Aspirin) .... Once daily 9)  Multi For Her 50+ Tabs (Multiple vitamins-minerals) .... Once daily 10)  Calcium 600 1500 Mg Tabs (Calcium carbonate) .... Once daily 11)  Vitamin B-12 1000 Mcg Subl (Cyanocobalamin) .... Once  daily 12)  Fexofenadine Hcl 180 Mg Tabs (Fexofenadine hcl) .... As needed 13)  Fluticasone Propionate 50 Mcg/act Susp (Fluticasone propionate) .... 2 sprays per nostril as needed 14)  Carvedilol 6.25 Mg Tabs (Carvedilol) .... One by mouth two times a day 15)  Zoloft 50 Mg Tabs (Sertraline hcl) .... Once daily 16)  Vitamin E 1000 Unit Caps (Vitamin e) .... Once daily 17)  Diclofenac  Sodium 75 Mg Tbec (Diclofenac sodium) .... One by mouth two times a day as needed pain  Patient Instructions: 1)  Schedule the following labs in 1 month: 2)  lipid and hepatic panel  272.4 3)  A1C 250.02 4)  Please schedule an office follow-up appointment in 4 months .

## 2010-12-08 NOTE — Assessment & Plan Note (Signed)
Summary: 4 month follow up/cjr   Vital Signs:  Patient profile:   70 year old female Menstrual status:  postmenopausal Weight:      225 pounds Temp:     98.0 degrees F oral BP sitting:   150 / 86  (left arm) Cuff size:   large  Vitals Entered By: Nira Conn LPN (November  4, 624THL 10:04 AM)  CC: 4 month follow-up, Hypertension Management, Lipid Management Is Patient Diabetic? Yes Did you bring your meter with you today? No   History of Present Illness: Patient here for medical followup.   Hx hypertension, type 2 diabetes, hyperlipidemia, and depression. She has lost 7 pounds since last visit. Overall feels well. Blood pressure stable at 135/60 at home. Compliant with all medications.  Fasting blood sugars average around 140. No symptoms of hyperglycemia. No hypoglycemia. Eye exams in May.  Ingrown nail from last visit has healed well.  Diabetes Management History:      She has not been enrolled in the "Diabetic Education Program".  She states understanding of dietary principles and is following her diet appropriately.  No sensory loss is reported.  Self foot exams are being performed.  She is checking home blood sugars.  She says that she is not exercising regularly.        Hypoglycemic symptoms are not occurring.  No hyperglycemic symptoms are reported.        There are no symptoms to suggest diabetic complications.    Hypertension History:      She denies headache, chest pain, palpitations, dyspnea with exertion, orthopnea, peripheral edema, visual symptoms, neurologic problems, syncope, and side effects from treatment.        Positive major cardiovascular risk factors include female age 66 years old or older, diabetes, hyperlipidemia, and hypertension.  Negative major cardiovascular risk factors include negative family history for ischemic heart disease and non-tobacco-user status.        Further assessment for target organ damage reveals no history of ASHD, stroke/TIA, or  peripheral vascular disease.    Lipid Management History:      Positive NCEP/ATP III risk factors include female age 78 years old or older, diabetes, HDL cholesterol less than 40, and hypertension.  Negative NCEP/ATP III risk factors include no family history for ischemic heart disease, non-tobacco-user status, no ASHD (atherosclerotic heart disease), no prior stroke/TIA, no peripheral vascular disease, and no history of aortic aneurysm.      Allergies: 1)  ! 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 Hyperlipidemia  Past Surgical History: Last updated: 02/25/2009 Tubal ligation 1968 Cholecystectomy  2005  Family History: Last updated: 02/25/2009 Family History of Arthritis Family History High cholesterol Family History Hypertension Diabetes Stroke  Social History: Last updated: 02/25/2009 Occupation:  [employer], office assistant Married Never Smoked Alcohol use-no  Risk Factors: Exercise: no (06/16/2010)  Risk Factors: Smoking Status: never (06/16/2010) PMH-FH-SH reviewed for relevance  Physical Exam  General:  Well-developed,well-nourished,in no acute distress; alert,appropriate and cooperative throughout examination Mouth:  Oral mucosa and oropharynx without lesions or exudates.  Teeth in good repair. Neck:  No deformities, masses, or tenderness noted. Lungs:  Normal respiratory effort, chest expands symmetrically. Lungs are clear to auscultation, no crackles or wheezes. Heart:  Normal rate and regular rhythm. S1 and S2 normal without gallop, murmur, click, rub or other extra sounds. Extremities:  No clubbing, cyanosis, edema,  or deformity noted with normal full range of motion of all joints.   Neurologic:  alert & oriented X3 and cranial nerves II-XII intact.    Diabetes Management Exam:     Foot Exam (with socks and/or shoes not present):       Sensory-Pinprick/Light touch:          Left medial foot (L-4): normal          Left dorsal foot (L-5): normal          Left lateral foot (S-1): normal          Right medial foot (L-4): normal          Right dorsal foot (L-5): normal          Right lateral foot (S-1): normal       Sensory-Monofilament:          Left foot: normal          Right foot: normal       Inspection:          Left foot: normal          Right foot: normal       Nails:          Left foot: normal          Right foot: normal   Impression & Recommendations:  Problem # 1:  DIABETES MELLITUS, TYPE II (ICD-250.00) cont weight loss efforts. Her updated medication list for this problem includes:    Glipizide 2.5 Mg Xr24h-tab (Glipizide) ..... Once daily    Lisinopril 40 Mg Tabs (Lisinopril) ..... One by mouth once daily    Metformin Hcl 500 Mg Tabs (Metformin hcl) .Marland Kitchen..Marland Kitchen Two by mouth q am and one by mouth q pm    Aspirin Adult Low Strength 81 Mg Tbec (Aspirin) ..... Once daily  Problem # 2:  HYPERTENSION (ICD-401.9)  Her updated medication list for this problem includes:    Lisinopril 40 Mg Tabs (Lisinopril) ..... One by mouth once daily    Hydrochlorothiazide 25 Mg Tabs (Hydrochlorothiazide) .Marland Kitchen... 1/2 tab daily    Felodipine 5 Mg Xr24h-tab (Felodipine) ..... Once daily    Carvedilol 6.25 Mg Tabs (Carvedilol) ..... One by mouth two times a day  Problem # 3:  HYPERLIPIDEMIA (ICD-272.4)  Her updated medication list for this problem includes:    Lipitor 20 Mg Tabs (Atorvastatin calcium) ..... One by mouth once daily  Complete Medication List: 1)  Glipizide 2.5 Mg Xr24h-tab (Glipizide) .... Once daily 2)  Lisinopril 40 Mg Tabs (Lisinopril) .... One by mouth once daily 3)  Metformin Hcl 500 Mg Tabs (Metformin hcl) .... Two by mouth q am and one by mouth q pm 4)  Hydrochlorothiazide 25 Mg Tabs (Hydrochlorothiazide) .... 1/2 tab daily 5)  Felodipine 5 Mg  Xr24h-tab (Felodipine) .... Once daily 6)  Lipitor 20 Mg Tabs (Atorvastatin calcium) .... One by mouth once daily 7)  Fish Oil 1200 Mg Caps (Omega-3 fatty acids) .... Once daily 8)  Aspirin Adult Low Strength 81 Mg Tbec (Aspirin) .... Once daily 9)  Multi For Her 50+ Tabs (Multiple vitamins-minerals) .... Once daily 10)  Calcium 600 1500 Mg Tabs (Calcium carbonate) .... Once daily 11)  Vitamin B-12 1000 Mcg Subl (Cyanocobalamin) .... Once daily 12)  Fexofenadine Hcl 180 Mg Tabs (Fexofenadine hcl) .... As needed 13)  Fluticasone Propionate 50 Mcg/act Susp (Fluticasone propionate) .... 2 sprays per nostril as needed 14)  Carvedilol 6.25 Mg Tabs (Carvedilol) .... One by mouth  two times a day 15)  Zoloft 50 Mg Tabs (Sertraline hcl) .... Once daily 16)  Vitamin E 1000 Unit Caps (Vitamin e) .... Once daily 17)  Diclofenac Sodium 75 Mg Tbec (Diclofenac sodium) .... One by mouth two times a day as needed pain  Diabetes Management Assessment/Plan:      The following lipid goals have been established for the patient: Total cholesterol goal of 200; LDL cholesterol goal of 100; HDL cholesterol goal of 40; Triglyceride goal of 150.  Her blood pressure goal is < 130/80.    Hypertension Assessment/Plan:      The patient's hypertensive risk group is category C: Target organ damage and/or diabetes.  Today's blood pressure is 150/86.  Her blood pressure goal is < 130/80.  Lipid Assessment/Plan:      Based on NCEP/ATP III, the patient's risk factor category is "history of diabetes".  The patient's lipid goals are as follows: Total cholesterol goal is 200; LDL cholesterol goal is 100; HDL cholesterol goal is 40; Triglyceride goal is 150.    Patient Instructions: 1)  HgB A1C within the next 1-2 months  250.02 2)  Bring your BP cuff to next visit to check with ours. Prescriptions: CARVEDILOL 6.25 MG TABS (CARVEDILOL) one by mouth two times a day  #180 x 3   Entered and Authorized by:   Carolann Littler MD    Signed by:   Carolann Littler MD on 09/09/2010   Method used:   Electronically to        [facility] (retail)       [address]       [place], [place]  [address]       Ph: [phone]       Fax: [phone]   RxID:   [id number]    Orders Added: 1)  Est. Patient Level IV [id number]

## 2010-12-08 NOTE — Assessment & Plan Note (Signed)
Summary: follow from labs/ per Dr/cjr   Vital Signs:  Patient profile:   70 year old female Menstrual status:  postmenopausal Weight:      234 pounds Temp:     97.5 degrees F oral BP sitting:   150 / 82  (left arm) Cuff size:   large  Vitals Entered By: Nira Conn LPN (May 27, 624THL [id number] AM) CC: Follow-up on meds, Hypertension Management, Lipid Management   History of Present Illness: Patient here for followup multiple medical problems. Recently dealing with the stress of her sister who passed away on hospice several weeks ago.  Acute issue is left sacroiliac joint pain past 2 weeks. Remote history of coccyx fracture. No recent injury. Pain worse with sitting. Pain is sharp at times. No radiculopathy symptoms.  Took diclofenac from other family member that seemed to help.  Type 2 diabetes. Not monitoring at home recently. Recent A1c 7.2%. Eye exams in November. No symptoms of hyperglycemia. Compliant with medications.  Hyperlipidemia but not recently taking her statin regularly. Lipids very poorly controlled. No history of CAD.  Hypertension treated with multiple medications. She has been compliant with blood pressure medications for the most part.  Diabetes Management History:      She has not been enrolled in the "Diabetic Education Program".  She states understanding of dietary principles but she is not following the appropriate diet.  No sensory loss is reported.  Self foot exams are being performed.  She is checking home blood sugars.  She says that she is not exercising regularly.        Hypoglycemic symptoms are not occurring.  No hyperglycemic symptoms are reported.    Hypertension History:      She denies headache, chest pain, palpitations, dyspnea with exertion, orthopnea, PND, peripheral edema, visual symptoms, neurologic problems, syncope, and side effects from treatment.  She notes no problems with any antihypertensive medication side effects.        Positive major  cardiovascular risk factors include female age 74 years old or older, diabetes, hyperlipidemia, and hypertension.  Negative major cardiovascular risk factors include negative family history for ischemic heart disease and non-tobacco-user status.        Further assessment for target organ damage reveals no history of ASHD, stroke/TIA, or peripheral vascular disease.    Lipid Management History:      Positive NCEP/ATP III risk factors include female age 16 years old or older, diabetes, HDL cholesterol less than 40, and hypertension.  Negative NCEP/ATP III risk factors include no family history for ischemic heart disease, non-tobacco-user status, no ASHD (atherosclerotic heart disease), no prior stroke/TIA, no peripheral vascular disease, and no history of aortic aneurysm.      Preventive Screening-Counseling & Management  Caffeine-Diet-Exercise     Does Patient Exercise: no  Allergies: 1)  ! 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  Past Surgical History: Last updated: 02/25/2009 Tubal ligation 1968 Cholecystectomy  2005  Family History: Last updated: 02/25/2009 Family History of Arthritis Family History High cholesterol Family History Hypertension Diabetes Stroke  Social History: Last updated: 02/25/2009 Occupation:  [employer], office assistant Married Never Smoked Alcohol use-no  Risk Factors: Exercise: no (04/01/2010)  Risk Factors: Smoking Status: never (11/26/2009) PMH-FH-SH reviewed for relevance  Social History: Does Patient Exercise:  no  Review of Systems  The patient denies anorexia, fever, weight loss, chest pain,  syncope, dyspnea on exertion, peripheral edema, prolonged cough, abdominal pain, melena, hematochezia, incontinence, muscle weakness, and difficulty walking.    Physical  Exam  General:  Well-developed,well-nourished,in no acute distress; alert,appropriate and cooperative throughout examination Eyes:  No corneal or conjunctival inflammation noted. EOMI. Perrla. Funduscopic exam benign, without hemorrhages, exudates or papilledema. Vision grossly normal. Ears:  External ear exam shows no significant lesions or deformities.  Otoscopic examination reveals clear canals, tympanic membranes are intact bilaterally without bulging, retraction, inflammation or discharge. Hearing is grossly normal bilaterally. Mouth:  Oral mucosa and oropharynx without lesions or exudates.  Teeth in good repair. Neck:  No deformities, masses, or tenderness noted. Lungs:  Normal respiratory effort, chest expands symmetrically. Lungs are clear to auscultation, no crackles or wheezes. Heart:  normal rate, regular rhythm, and no murmur.   Msk:  Slightly tender L SI joint region. SLRs neg bilaterally. Extremities:  No clubbing, cyanosis, edema, or deformity noted with normal full range of motion of all joints.   Neurologic:  alert & oriented X3 and cranial nerves II-XII intact.   Psych:  normally interactive, good eye contact, and not anxious appearing.    Diabetes Management Exam:    Eye Exam:       Eye Exam done elsewhere          Date: 09/07/2009          Results: normal   Impression & Recommendations:  Problem # 1:  DIABETES MELLITUS, TYPE II (ICD-250.00) Assessment Deteriorated recent increased A1c. Titrate metformin and work on weight loss Her updated medication list for this problem includes:    Glipizide 2.5 Mg Xr24h-tab (Glipizide) ..... Once daily    Lisinopril 40 Mg Tabs (Lisinopril) ..... One by mouth once daily    Metformin Hcl 500 Mg Tabs (Metformin hcl) .Marland Kitchen..Marland Kitchen Two by mouth q am and one by mouth q pm    Aspirin Adult Low Strength 81 Mg Tbec (Aspirin) ..... Once daily  Problem # 2:  HYPERLIPIDEMIA (ICD-272.4) Assessment: Deteriorated  poorly controlled. Discontinue  lovastatin and start Lipitor Her updated medication list for this problem includes:    Lipitor 20 Mg Tabs (Atorvastatin calcium) ..... One by mouth once daily  Orders: Prescription Created Electronically [phone])  Problem # 3:  HYPERTENSION (ICD-401.9) Assessment: Deteriorated poorly controlled. Increase lisinopril to 40 mg daily Her updated medication list for this problem includes:    Lisinopril 40 Mg Tabs (Lisinopril) ..... One by mouth once daily    Hydrochlorothiazide 25 Mg Tabs (Hydrochlorothiazide) .Marland Kitchen... 1/2 tab daily    Felodipine 5 Mg Xr24h-tab (Felodipine) ..... Once daily    Carvedilol 6.25 Mg Tabs (Carvedilol) ..... One by mouth two times a day  Problem # 4:  SACROILIAC STRAIN (ICD-846.9) Assessment: New short-term use of diclofenac  Complete Medication List: 1)  Glipizide 2.5 Mg Xr24h-tab (Glipizide) .... Once daily 2)  Lisinopril 40 Mg Tabs (Lisinopril) .... One by mouth once daily 3)  Metformin Hcl 500 Mg Tabs (Metformin hcl) .... Two by mouth q am and one by mouth q pm 4)  Hydrochlorothiazide 25 Mg Tabs (Hydrochlorothiazide) .... 1/2 tab daily 5)  Felodipine 5 Mg Xr24h-tab (Felodipine) .... Once daily 6)  Lipitor 20 Mg Tabs (Atorvastatin calcium) .... One by mouth once daily 7)  Fish Oil 1200 Mg Caps (Omega-3 fatty acids) .... Once daily 8)  Aspirin Adult Low Strength 81 Mg Tbec (Aspirin) .... Once daily 9)  Multi For Her 50+ Tabs (Multiple vitamins-minerals) .... Once daily 10)  Calcium 600 1500  Mg Tabs (Calcium carbonate) .... Once daily 11)  Vitamin B-12 1000 Mcg Subl (Cyanocobalamin) .... Once daily 12)  Fexofenadine Hcl 180 Mg Tabs (Fexofenadine hcl) .... As needed 13)  Fluticasone Propionate 50 Mcg/act Susp (Fluticasone propionate) .... 2 sprays per nostril as needed 14)  Carvedilol 6.25 Mg Tabs (Carvedilol) .... One by mouth two times a day 15)  Zoloft 50 Mg Tabs (Sertraline hcl) .... Once daily 16)  Vitamin E 1000 Unit Caps (Vitamin e) .... Once daily 17)   Diclofenac Sodium 75 Mg Tbec (Diclofenac sodium) .... One by mouth two times a day as needed pain  Diabetes Management Assessment/Plan:      The following lipid goals have been established for the patient: Total cholesterol goal of 200; LDL cholesterol goal of 100; HDL cholesterol goal of 40; Triglyceride goal of 150.    Hypertension Assessment/Plan:      The patient's hypertensive risk group is category C: Target organ damage and/or diabetes.  Today's blood pressure is 150/82.    Lipid Assessment/Plan:      Based on NCEP/ATP III, the patient's risk factor category is "history of diabetes".  The patient's lipid goals are as follows: Total cholesterol goal is 200; LDL cholesterol goal is 100; HDL cholesterol goal is 40; Triglyceride goal is 150.    Patient Instructions: 1)  schedule followup in 6 weeks 2)  You need to lose weight. Consider a lower calorie diet and regular exercise.  3)  Check your blood sugars regularly. If your readings are usually above:  or below 70 you should contact our office.  4)  It is important that your diabetic A1c level is checked every 3 months.  5)  See your eye doctor yearly to check for diabetic eye damage. 6)  Check your feet each night  for sore areas, calluses or signs of infection.  Prescriptions: LIPITOR 20 MG TABS (ATORVASTATIN CALCIUM) one by mouth once daily  #30 x 6   Entered and Authorized by:   Carolann Littler MD   Signed by:   Carolann Littler MD on 04/01/2010   Method used:   Electronically to        [facility] (retail)       [address]       [place], [place]  [address]       Ph: [phone]       Fax: [phone]   RxID:   [id number] METFORMIN HCL 500 MG TABS (METFORMIN HCL) two by mouth q am and one by mouth q pm  #270 x 3   Entered and Authorized by:   Carolann Littler MD   Signed by:   Carolann Littler MD on 04/01/2010   Method used:   Electronically to        [facility] (retail)       [address]       [place], [place]  [address]       Ph: [phone]       Fax: [phone]   RxID:   [id number] LISINOPRIL 40 MG TABS (LISINOPRIL) one by mouth once daily  #90 x 3   Entered and Authorized by:   Carolann Littler MD   Signed by:   Carolann Littler MD on 04/01/2010   Method used:   Electronically to        [facility] (  retail)       [place], [place]  [address]       Ph: [phone]       Fax: [phone]   RxID:   [id number] DICLOFENAC SODIUM 75 MG TBEC (DICLOFENAC SODIUM) one by mouth two times a day as needed pain  #60 x 1   Entered and Authorized by:   Carolann Littler MD   Signed by:   Carolann Littler MD on 04/01/2010   Method used:   Electronically to        [facility] (retail)       [address]       [place],   [address]       Ph: [phone]       Fax: [phone]   RxID:   [id number]

## 2010-12-08 NOTE — Assessment & Plan Note (Signed)
Summary: TOENAIL EXCISION/NJR/PT RSC/CJR   Vital Signs:  Patient profile:   70 year old female Menstrual status:  postmenopausal Temp:     98.7 degrees F oral BP sitting:   170 / 90  (left arm) Cuff size:   large CC: Rt great toenail excision Is Patient Diabetic? Yes Did you bring your meter with you today? No   History of Present Illness: Ingrown toenail. This has been presentfor several months.  Painful with ambulation. No drainage. No granulation changes.  Would like to have excised as previously discussed. She does have type 2 diabetes.  Preventive Screening-Counseling & Management  Alcohol-Tobacco     Smoking Status: never  Allergies: 1)  ! 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 There is no granulation tissue and no real drainage. No erythema.  moderate tenderness.   Impression & Recommendations:  Problem # 1:  INGROWN NAIL (ICD-703.0) Discussed risk and benefits of digital block and partial excision of nail and patient consented.  Prepped toe with Betadine.digital block with 1% plain Xylocaine. Excised one third of nail without difficulty. Minimal bleeding. Antibiotic and dressing applied. Wound care instruction given. Orders: Nail avulsion, partial or complete ([id number])  Complete Medication List: 1)  Glipizide 2.5 Mg Xr24h-tab (Glipizide) .... Once daily 2)  Lisinopril 20 Mg Tabs (Lisinopril) .... Once daily 3)  Metformin Hcl 500 Mg  Tabs (Metformin hcl) .... Two times a day 4)  Hydrochlorothiazide 25 Mg Tabs (Hydrochlorothiazide) .... 1/2 tab daily 5)  Felodipine 5 Mg Xr24h-tab (Felodipine) .... Once daily 6)  Lovastatin 20 Mg Tabs (Lovastatin) .... Once daily 7)  Fish Oil 1200 Mg Caps (Omega-3 fatty acids) .... Once daily 8)  Aspirin Adult Low Strength 81 Mg Tbec (Aspirin) .... Once daily 9)  Multi For Her 50+ Tabs (Multiple vitamins-minerals) .... Once daily 10)  Calcium 600 1500 Mg Tabs (Calcium carbonate) .... Once daily 11)  Vitamin B-12 1000 Mcg Subl (Cyanocobalamin) .... Once daily 12)  Fexofenadine Hcl 180 Mg Tabs (Fexofenadine hcl) .... As needed 13)  Fluticasone Propionate 50 Mcg/act Susp (Fluticasone propionate) .... 2 sprays per nostril as needed 14)  Carvedilol 6.25 Mg Tabs (Carvedilol) .... One by mouth two times a day 15)  Zoloft 50 Mg Tabs (Sertraline hcl) .... Once daily  Patient Instructions: 1)  Keep foot elevated frequently today. 2)  Keep dry for the first 24 hours. 3)  By tomorrow  clean with soap and water and apply topical antibiotic for the next 3 or 4 days.   Immunization History:  Tetanus/Td Immunization History:    Tetanus/Td:  historical (11/06/2002)  Influenza Immunization History:    Influenza:  historical (08/06/2009)

## 2010-12-08 NOTE — Progress Notes (Signed)
Summary: Pt req to get labs done for b-12 lvl,cholesterol and a1c  Phone Note Call from Patient Call back at [facility] Phone [phone]   Caller: Patient Summary of Call: Pt called and would like to get b-12 lvl checked, A1C, and cholesterol lvl checked. Please advise. Pt wants this done next friday, 03/25/10.  Initial call taken by: Braulio Bosch,  Mar 18, 2010 2:00 PM  Follow-up for Phone Call        Recommend the following labs: A1C   250.00 Lipid and hepatic panel  272.4 B12 level  V 12.3 BMP  401.9  Pt needs to schedule office f/u after labs back.  Follow-up by: Carolann Littler MD,  Mar 18, 2010 5:49 PM  Additional Follow-up for Phone Call Additional follow up Details #1::        I called pts and sch her for 03/25/10 9:45 for the labs noted above and on 04/01/10 for follow up. Additional Follow-up by: Braulio Bosch,  Mar 21, 2010 9:04 AM

## 2010-12-08 NOTE — Assessment & Plan Note (Signed)
Summary: 3 month rov/njr  pt rsc/njr   Vital Signs:  Patient profile:   70 year old female Menstrual status:  postmenopausal Weight:      232 pounds Temp:     98 degrees F oral BP sitting:   158 / 90  (left arm) Cuff size:   large  Vitals Entered By: Nira Conn LPN (September 24, 624THL 8:58 AM)  Serial Vital Signs/Assessments:  Time      Position  BP       Pulse  Resp  Temp     By                     180/84                         Carolann Littler MD  CC: 3 month Follow-up   History of Present Illness: Followup hypertension and diabetes. Blood pressures been recently well controlled home readings. Previous intolerance to higher dose calcium channel blocker secondary to edema. Current meds reviewed she is compliant with all medications. No reported side effects. Added Coreg last visit and she is tolerated without side effect.  Blood sugars been fairly well-controlled. She has been more compliant with diet and walking more frequently. Denies any chest pains or dizziness. No hypoglycemic episodes. Get yearly eye exams.  Allergies: 1)  ! 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    Physical Exam  General:  Well-developed,well-nourished,in no acute distress; alert,appropriate and cooperative throughout examination Neck:  No deformities, masses, or tenderness noted. Lungs:  Normal respiratory effort, chest expands  symmetrically. Lungs are clear to auscultation, no crackles or wheezes. Heart:  Normal rate and regular rhythm. S1 and S2 normal without gallop, murmur, click, rub or other extra sounds. Extremities:  No clubbing, cyanosis, edema, or deformity noted with normal full range of motion of all joints.  no foot lesions no calluses. 2 dorsalis Salas pedis pulses bilaterally. Neurologic:  normal sensory function both feet. Skin:  Intact without suspicious lesions or rashes Cervical Nodes:  No lymphadenopathy noted Psych:  Cognition and judgment appear intact. Alert and cooperative with normal attention span and concentration. No apparent delusions, illusions, hallucinations   Impression & Recommendations:  Problem # 1:  HYPERTENSION (ICD-401.9) Assessment Deteriorated Poorly controlled. Titrate Coreg. Work on weight loss Her updated medication list for this problem includes:    Lisinopril 20 Mg Tabs (Lisinopril) ..... Once daily    Hydrochlorothiazide 25 Mg Tabs (Hydrochlorothiazide) ..... Once daily    Felodipine 5 Mg Xr24h-tab (Felodipine) ..... One by mouth once daily    Carvedilol 6.25 Mg Tabs (Carvedilol) ..... One by mouth two times a day  Problem # 2:  DIABETES MELLITUS, TYPE II (ICD-250.00)  reassess A1c today. Her updated medication list for this problem includes:    Glipizide 2.5 Mg Xr24h-tab (Glipizide) ..... Once daily    Lisinopril 20 Mg Tabs (Lisinopril) ..... Once daily    Metformin Hcl 500 Mg Tabs (Metformin hcl) .Marland Kitchen..Marland Kitchen Two times a day  Aspirin Adult Low Strength 81 Mg Tbec (Aspirin) ..... Once daily  Orders: TLB-A1C / Hgb A1C (Glycohemoglobin) (83036-A1C)  Complete Medication List: 1)  Glipizide 2.5 Mg Xr24h-tab (Glipizide) .... Once daily 2)  Lisinopril 20 Mg Tabs (Lisinopril) .... Once daily 3)  Metformin Hcl 500 Mg Tabs (Metformin hcl) .... Two times a day 4)  Hydrochlorothiazide 25 Mg Tabs (Hydrochlorothiazide) .... Once daily 5)  Felodipine 5 Mg Xr24h-tab  (Felodipine) .... One by mouth once daily 6)  Lovastatin 20 Mg Tabs (Lovastatin) .... Once daily 7)  Fish Oil 1200 Mg Caps (Omega-3 fatty acids) .... Once daily 8)  Aspirin Adult Low Strength 81 Mg Tbec (Aspirin) .... Once daily 9)  Multi For Her 50+ Tabs (Multiple vitamins-minerals) .... Once daily 10)  Calcium 600 1500 Mg Tabs (Calcium carbonate) .... Once daily 11)  Vitamin B-12 1000 Mcg Subl (Cyanocobalamin) .... Once daily 12)  Fexofenadine Hcl 180 Mg Tabs (Fexofenadine hcl) .... One by mouth once daily 13)  Fluticasone Propionate 50 Mcg/act Susp (Fluticasone propionate) .... 2 sprays per nostril as needed 14)  Carvedilol 6.25 Mg Tabs (Carvedilol) .... One by mouth two times a day 15)  Zoloft 50 Mg Tabs (Sertraline hcl) .... Once daily  Patient Instructions: 1)  Please schedule a follow-up appointment in 1 month.  2)  It is important that you exercise reguarly at least 20 minutes 5 times a week. If you develop chest pain, have severe difficulty breathing, or feel very tired, stop exercising immediately and seek medical attention.  3)  You need to lose weight. Consider a lower calorie diet and regular exercise.  4)  Check your blood sugars regularly. If your readings are usually above:  or below 70 you should contact our office.  5)  It is important that your diabetic A1c level is checked every 3 months.  6)  See your eye doctor yearly to check for diabetic eye damage. 7)  Check your feet each night  for sore areas, calluses or signs of infection.  Prescriptions: CARVEDILOL 6.25 MG TABS (CARVEDILOL) one by mouth two times a day  #60 x 11   Entered and Authorized by:   Carolann Littler MD   Signed by:   Carolann Littler MD on 07/30/2009   Method used:   Electronically to        [facility] (retail)       [address]       [place], [place]  [address]       Ph: [phone]       Fax: [phone]   RxID:   [id number]

## 2010-12-08 NOTE — Assessment & Plan Note (Signed)
Summary: recheck and a1c/njr   Vital Signs:  Patient profile:   70 year old female Menstrual status:  postmenopausal Weight:      236 pounds Temp:     98.1 degrees F oral BP sitting:   160 / 88  (left arm) Cuff size:   regular  Vitals Entered By: Nira Conn LPN (May 20, 624THL 624THL AM) CC: Pt fasating, re-check A1-C, Felodipine problems   History of Present Illness: Followup hypertension and type 2 diabetes. Blood pressures been poorly controlled. Intolerance of felodipine at 10 mg daily with edema. She is currently on felodipine 5 mg daily,  hctz 25 mg one half tablet daily and lisinopril 20 mg daily. Blood sugars have been suboptimally controlled.  Due for A1c today. No hypoglycemic symptoms. Last eye exam less than a year ago normal. Denies any recent chest pains, shortness of breath or any other difficulties. Sinus infection symptoms with clear since last visit  Allergies: 1)  ! Penicillin V Potassium (Penicillin V Potassium)  Past History:  Past Medical History:    Allergic rhinitis    Diabetes mellitus, type II    Hypertension    Migraines    polyps in colon    arthritis     (02/25/2009)  Social History:    Occupation:  Psychiatrist, office assistant    Married    Never Smoked    Alcohol use-no     (02/25/2009)  Review of Systems  The patient denies weight loss, weight gain, chest pain, dyspnea on exertion, prolonged cough, headaches, and abdominal pain.    Physical Exam  General:  Well-developed,well-nourished,in no acute distress; alert,appropriate and cooperative throughout examination Mouth:  Oral mucosa and oropharynx without lesions or exudates.  Teeth in good repair. Neck:  No deformities, masses, or tenderness noted. Lungs:  Normal respiratory effort, chest expands symmetrically. Lungs are clear to auscultation, no crackles or wheezes. Heart:  Normal rate and regular rhythm. S1 and S2 normal without gallop, murmur, click, rub or other extra  sounds. Pulses:  R and L carotid,radial,femoral,dorsalis pedis and posterior tibial pulses are full and equal bilaterally Extremities:  no edema Neurologic:  normal symmetric function in the feet   Impression & Recommendations:  Problem # 1:  DIABETES MELLITUS, TYPE II (ICD-250.00)  Reassess A1c today. Titrate metformin if still poorly controlled. Work on weight loss. Her updated medication list for this problem includes:    Glipizide 5 Mg Tabs (Glipizide) ..... Once daily    Lisinopril 20 Mg Tabs (Lisinopril) ..... Once daily    Metformin Hcl 500 Mg Tabs (Metformin hcl) .Marland Kitchen..Marland Kitchen Two times a day    Aspirin Adult Low Strength 81 Mg Tbec (Aspirin) ..... Once daily  Orders: TLB-A1C / Hgb A1C (Glycohemoglobin) (83036-A1C)  Problem # 2:  HYPERTENSION (ICD-401.9) Assessment: Deteriorated Poor control. Intolerance of higher dose felodipine. Add Coreg 3.125 mg b.i.d. and reassess blood pressure in one month.  Work on weight loss. Her updated medication list for this problem includes:    Lisinopril 20 Mg Tabs (Lisinopril) ..... Once daily    Hydrochlorothiazide 25 Mg Tabs (Hydrochlorothiazide) ..... Once daily    Felodipine 5 Mg Xr24h-tab (Felodipine) ..... One by mouth once daily    Coreg 3.125 Mg Tabs (Carvedilol) ..... One by mouth two times a day  Problem # 3:  ANEMIA, HX OF (ICD-V12.3)  History of anemia with B12 deficiency with patient currently on oral replacement. Reassess CBC and B12 levels today.  Orders: TLB-CBC Platelet - w/Differential (85025-CBCD) TLB-B12,  Serum-Total ONLY [phone])  Complete Medication List: 1)  Glipizide 5 Mg Tabs (Glipizide) .... Once daily 2)  Lisinopril 20 Mg Tabs (Lisinopril) .... Once daily 3)  Metformin Hcl 500 Mg Tabs (Metformin hcl) .... Two times a day 4)  Hydrochlorothiazide 25 Mg Tabs (Hydrochlorothiazide) .... Once daily 5)  Felodipine 5 Mg Xr24h-tab (Felodipine) .... One by mouth once daily 6)  Lovastatin 20 Mg Tabs (Lovastatin) .... Once  daily 7)  Fish Oil 1200 Mg Caps (Omega-3 fatty acids) .... Once daily 8)  Aspirin Adult Low Strength 81 Mg Tbec (Aspirin) .... Once daily 9)  Multi For Her 50+ Tabs (Multiple vitamins-minerals) .... Once daily 10)  Calcium 600 1500 Mg Tabs (Calcium carbonate) .... Once daily 11)  Vitamin B-12 1000 Mcg Subl (Cyanocobalamin) .... Once daily 12)  Ceftin 250 Mg Tabs (Cefuroxime axetil) .... One tab bid 13)  Fexofenadine Hcl 180 Mg Tabs (Fexofenadine hcl) .... One by mouth once daily 14)  Fluticasone Propionate 50 Mcg/act Susp (Fluticasone propionate) .... 2 sprays per nostril once daily 15)  Coreg 3.125 Mg Tabs (Carvedilol) .... One by mouth two times a day  Patient Instructions: 1)  Please schedule a follow-up appointment in 1 month.  Prescriptions: FELODIPINE 5 MG XR24H-TAB (FELODIPINE) one by mouth once daily  #30 x 5   Entered and Authorized by:   Carolann Littler MD   Signed by:   Carolann Littler MD on 03/25/2009   Method used:   Electronically to        [facility] (retail)       [address]       [place], [place]  [address]       Ph: [phone]       Fax: [phone]   RxID:   [id number] COREG 3.125 MG TABS (CARVEDILOL) one by mouth two times a day  #60 x 5   Entered and Authorized by:   Carolann Littler MD   Signed by:   Carolann Littler MD on 03/25/2009   Method used:   Electronically to        [facility] (retail)       [address]       [place], [place]  [address]       Ph: [phone]       Fax: [phone]   RxID:   [id number]

## 2010-12-08 NOTE — Progress Notes (Signed)
Summary: sinus  Phone Note Call from Patient   Caller: Patient Call For: Carolann Littler MD Summary of Call: Pt is complaining of URI with post nasal drainage, (yellow).  Productive cough.  No fever.  Is requesting an antibiotic and cough RX. Vladimir Faster [facility] Initial call taken by: Deanna Artis CMA,  November 12, 2009 8:26 AM    New/Updated Medications: HYDROMET 5-1.5 MG/5ML SYRP (HYDROCODONE-HOMATROPINE) one teaspoon q 6 hrs as needed cough Prescriptions: HYDROMET 5-1.5 MG/5ML SYRP (HYDROCODONE-HOMATROPINE) one teaspoon q 6 hrs as needed cough  #6 oz. x 0   Entered by:   Deanna Artis CMA   Authorized by:   Marletta Lor  MD   Signed by:   Deanna Artis CMA on 11/12/2009   Method used:   Telephoned to ...       [facility]  [address] (retail)       [place], [place]  [address]       Ph: [phone]       Fax: [phone]   RxID:   [id number]  Generic Hydromet, 6 ounces  1 teaspoon every 6 hours as needed for cough Per Dr. Raliegh Ip  Pt. notified.

## 2010-12-09 ENCOUNTER — Telehealth: Payer: Self-pay | Admitting: Family Medicine

## 2010-12-09 DIAGNOSIS — F32A Depression, unspecified: Secondary | ICD-10-CM

## 2010-12-09 DIAGNOSIS — F329 Major depressive disorder, single episode, unspecified: Secondary | ICD-10-CM

## 2010-12-09 NOTE — Telephone Encounter (Signed)
Pt went to [facility] on [address] and was told that no refill had been rcvd for pts Sertraline. Pls call or resend asap today and notify pt when this has been done.

## 2010-12-12 MED ORDER — SERTRALINE HCL 50 MG PO TABS: 50.0000 mg | ORAL_TABLET | Freq: Every day | ORAL | Status: DC

## 2011-01-03 ENCOUNTER — Telehealth: Payer: Self-pay | Admitting: Family Medicine

## 2011-01-03 DIAGNOSIS — E785 Hyperlipidemia, unspecified: Secondary | ICD-10-CM

## 2011-01-03 NOTE — Telephone Encounter (Signed)
Please advise 

## 2011-01-03 NOTE — Telephone Encounter (Signed)
Pt called and said that the Atorvastatin 20mg  is causing pt to have joint pain in legs. Pt is req that Lovastatin 20mg  be called in to [facility] in [place].

## 2011-01-04 MED ORDER — LOVASTATIN 20 MG PO TABS: 20.0000 mg | ORAL_TABLET | Freq: Every day | ORAL | Status: DC

## 2011-01-04 NOTE — Telephone Encounter (Signed)
We can try change to Lovastatin 20 mg po qd and will need follow up for lipids and hepatic in 2 months.

## 2011-01-04 NOTE — Telephone Encounter (Signed)
Rx sent, pt informed. 

## 2011-01-16 ENCOUNTER — Telehealth: Payer: Self-pay | Admitting: Family Medicine

## 2011-01-16 NOTE — Telephone Encounter (Signed)
Pt would like a referral to dermatologist for right great ingrown toenail. Pt stated she had this removed twice by doc.

## 2011-01-16 NOTE — Telephone Encounter (Signed)
Can refer but I would rec refer to podiatrist.  May need nail matrix ablation.  Unless she has preference, I would recommend she consider [facility], Dr Ulyess Mort al.

## 2011-01-17 NOTE — Telephone Encounter (Signed)
Detailed message left on pt personally identified VM

## 2011-02-06 ENCOUNTER — Other Ambulatory Visit: Payer: Self-pay | Admitting: Family Medicine

## 2011-02-06 DIAGNOSIS — Z1231 Encounter for screening mammogram for malignant neoplasm of breast: Secondary | ICD-10-CM

## 2011-02-16 ENCOUNTER — Ambulatory Visit: Payer: Self-pay

## 2011-02-21 LAB — CBC
HCT: 33.1 % — ABNORMAL LOW (ref 36.0–46.0)
HCT: 36.8 % (ref 36.0–46.0)
HCT: 37.9 % (ref 36.0–46.0)
Hemoglobin: 11.3 g/dL — ABNORMAL LOW (ref 12.0–15.0)
Hemoglobin: 12.5 g/dL (ref 12.0–15.0)
Hemoglobin: 12.9 g/dL (ref 12.0–15.0)
MCHC: 33.9 g/dL (ref 30.0–36.0)
MCHC: 34 g/dL (ref 30.0–36.0)
MCHC: 34 g/dL (ref 30.0–36.0)
MCV: 88.7 fL (ref 78.0–100.0)
MCV: 88.8 fL (ref 78.0–100.0)
MCV: 89.3 fL (ref 78.0–100.0)
Platelets: 179 10*3/uL (ref 150–400)
Platelets: 192 10*3/uL (ref 150–400)
Platelets: 197 10*3/uL (ref 150–400)
RBC: 3.73 MIL/uL — ABNORMAL LOW (ref 3.87–5.11)
RBC: 4.14 MIL/uL (ref 3.87–5.11)
RBC: 4.25 MIL/uL (ref 3.87–5.11)
RDW: 13.7 % (ref 11.5–15.5)
RDW: 14 % (ref 11.5–15.5)
RDW: 14.1 % (ref 11.5–15.5)
WBC: 13.4 10*3/uL — ABNORMAL HIGH (ref 4.0–10.5)
WBC: 16.5 10*3/uL — ABNORMAL HIGH (ref 4.0–10.5)
WBC: 5.7 10*3/uL (ref 4.0–10.5)

## 2011-02-21 LAB — URINALYSIS, ROUTINE W REFLEX MICROSCOPIC
Bilirubin Urine: NEGATIVE
Glucose, UA: NEGATIVE mg/dL
Hgb urine dipstick: NEGATIVE
Nitrite: NEGATIVE
Protein, ur: NEGATIVE mg/dL
Specific Gravity, Urine: 1.015 (ref 1.005–1.030)
Urobilinogen, UA: 0.2 mg/dL (ref 0.0–1.0)
pH: 6.5 (ref 5.0–8.0)

## 2011-02-21 LAB — GLUCOSE, CAPILLARY
Glucose-Capillary: 119 mg/dL — ABNORMAL HIGH (ref 70–99)
Glucose-Capillary: 144 mg/dL — ABNORMAL HIGH (ref 70–99)
Glucose-Capillary: 148 mg/dL — ABNORMAL HIGH (ref 70–99)
Glucose-Capillary: 160 mg/dL — ABNORMAL HIGH (ref 70–99)
Glucose-Capillary: 256 mg/dL — ABNORMAL HIGH (ref 70–99)
Glucose-Capillary: 80 mg/dL (ref 70–99)

## 2011-02-21 LAB — DIFFERENTIAL
Basophils Absolute: 0 10*3/uL (ref 0.0–0.1)
Basophils Absolute: 0 10*3/uL (ref 0.0–0.1)
Basophils Absolute: 0 10*3/uL (ref 0.0–0.1)
Basophils Relative: 0 % (ref 0–1)
Basophils Relative: 0 % (ref 0–1)
Basophils Relative: 0 % (ref 0–1)
Eosinophils Absolute: 0 10*3/uL (ref 0.0–0.7)
Eosinophils Absolute: 0.2 10*3/uL (ref 0.0–0.7)
Eosinophils Absolute: 0.2 10*3/uL (ref 0.0–0.7)
Eosinophils Relative: 0 % (ref 0–5)
Eosinophils Relative: 1 % (ref 0–5)
Eosinophils Relative: 3 % (ref 0–5)
Lymphocytes Relative: 3 % — ABNORMAL LOW (ref 12–46)
Lymphocytes Relative: 4 % — ABNORMAL LOW (ref 12–46)
Lymphocytes Relative: 41 % (ref 12–46)
Lymphs Abs: 0.4 10*3/uL — ABNORMAL LOW (ref 0.7–4.0)
Lymphs Abs: 0.7 10*3/uL (ref 0.7–4.0)
Lymphs Abs: 2.4 10*3/uL (ref 0.7–4.0)
Monocytes Absolute: 0.5 10*3/uL (ref 0.1–1.0)
Monocytes Absolute: 0.6 10*3/uL (ref 0.1–1.0)
Monocytes Absolute: 0.6 10*3/uL (ref 0.1–1.0)
Monocytes Relative: 10 % (ref 3–12)
Monocytes Relative: 3 % (ref 3–12)
Monocytes Relative: 4 % (ref 3–12)
Neutro Abs: 12.5 10*3/uL — ABNORMAL HIGH (ref 1.7–7.7)
Neutro Abs: 14.9 10*3/uL — ABNORMAL HIGH (ref 1.7–7.7)
Neutro Abs: 2.6 10*3/uL (ref 1.7–7.7)
Neutrophils Relative %: 45 % (ref 43–77)
Neutrophils Relative %: 90 % — ABNORMAL HIGH (ref 43–77)
Neutrophils Relative %: 93 % — ABNORMAL HIGH (ref 43–77)

## 2011-02-21 LAB — CULTURE, BLOOD (ROUTINE X 2)
Culture: NO GROWTH
Culture: NO GROWTH
Report Status: 2102010
Report Status: 2102010

## 2011-02-21 LAB — BASIC METABOLIC PANEL
BUN: 13 mg/dL (ref 6–23)
BUN: 20 mg/dL (ref 6–23)
BUN: 20 mg/dL (ref 6–23)
CO2: 23 mEq/L (ref 19–32)
CO2: 24 mEq/L (ref 19–32)
CO2: 27 mEq/L (ref 19–32)
Calcium: 7.8 mg/dL — ABNORMAL LOW (ref 8.4–10.5)
Calcium: 8.1 mg/dL — ABNORMAL LOW (ref 8.4–10.5)
Calcium: 8.3 mg/dL — ABNORMAL LOW (ref 8.4–10.5)
Chloride: 100 mEq/L (ref 96–112)
Chloride: 103 mEq/L (ref 96–112)
Chloride: 105 mEq/L (ref 96–112)
Creatinine, Ser: 0.83 mg/dL (ref 0.4–1.2)
Creatinine, Ser: 0.9 mg/dL (ref 0.4–1.2)
Creatinine, Ser: 0.91 mg/dL (ref 0.4–1.2)
GFR calc Af Amer: >60 mL/min (ref >60)
GFR calc Af Amer: >60 mL/min (ref >60)
GFR calc Af Amer: >60 mL/min (ref >60)
GFR calc non Af Amer: >60 mL/min (ref >60)
GFR calc non Af Amer: >60 mL/min (ref >60)
GFR calc non Af Amer: >60 mL/min (ref >60)
Glucose, Bld: 159 mg/dL — ABNORMAL HIGH (ref 70–99)
Glucose, Bld: 228 mg/dL — ABNORMAL HIGH (ref 70–99)
Glucose, Bld: 256 mg/dL — ABNORMAL HIGH (ref 70–99)
Potassium: 3.3 mEq/L — ABNORMAL LOW (ref 3.5–5.1)
Potassium: 4 mEq/L (ref 3.5–5.1)
Potassium: 4.2 mEq/L (ref 3.5–5.1)
Sodium: 135 mEq/L (ref 135–145)
Sodium: 135 mEq/L (ref 135–145)
Sodium: 139 mEq/L (ref 135–145)

## 2011-02-21 LAB — URINE CULTURE
Colony Count: NO GROWTH
Culture: NO GROWTH

## 2011-02-21 LAB — STOOL CULTURE

## 2011-02-21 LAB — PHOSPHORUS: Phosphorus: 2.6 mg/dL (ref 2.3–4.6)

## 2011-02-21 LAB — MAGNESIUM: Magnesium: 1.5 mg/dL (ref 1.5–2.5)

## 2011-02-24 ENCOUNTER — Ambulatory Visit: Payer: Self-pay | Admitting: Family Medicine

## 2011-02-28 ENCOUNTER — Ambulatory Visit
Admission: RE | Admit: 2011-02-28 | Discharge: 2011-02-28 | Disposition: A | Discharge status: Home / Self Care | Payer: MEDICARE | Source: Ambulatory Visit | Attending: Family Medicine | Admitting: Family Medicine

## 2011-02-28 DIAGNOSIS — Z1231 Encounter for screening mammogram for malignant neoplasm of breast: Secondary | ICD-10-CM

## 2011-03-01 ENCOUNTER — Encounter: Payer: Self-pay | Admitting: Family Medicine

## 2011-03-02 ENCOUNTER — Encounter: Payer: Self-pay | Admitting: Family Medicine

## 2011-03-02 ENCOUNTER — Ambulatory Visit (INDEPENDENT_AMBULATORY_CARE_PROVIDER_SITE_OTHER): Payer: MEDICARE | Admitting: Family Medicine

## 2011-03-02 DIAGNOSIS — E785 Hyperlipidemia, unspecified: Secondary | ICD-10-CM

## 2011-03-02 DIAGNOSIS — E119 Type 2 diabetes mellitus without complications: Secondary | ICD-10-CM

## 2011-03-02 DIAGNOSIS — I10 Essential (primary) hypertension: Secondary | ICD-10-CM

## 2011-03-02 LAB — HM DIABETES FOOT EXAM: HM Diabetic Foot Exam: NORMAL

## 2011-03-02 LAB — BASIC METABOLIC PANEL
BUN: 20 mg/dL (ref 6–23)
CO2: 31 mEq/L (ref 19–32)
Calcium: 9.2 mg/dL (ref 8.4–10.5)
Chloride: 102 mEq/L (ref 96–112)
Creatinine, Ser: 1 mg/dL (ref 0.4–1.2)
GFR: 58.42 mL/min — ABNORMAL LOW (ref >60.00)
Glucose, Bld: 114 mg/dL — ABNORMAL HIGH (ref 70–99)
Potassium: 4.4 mEq/L (ref 3.5–5.1)
Sodium: 141 mEq/L (ref 135–145)

## 2011-03-02 LAB — HEPATIC FUNCTION PANEL
ALT: 19 U/L (ref 0–35)
AST: 19 U/L (ref 0–37)
Albumin: 3.6 g/dL (ref 3.5–5.2)
Alkaline Phosphatase: 84 U/L (ref 39–117)
Bilirubin, Direct: 0 mg/dL (ref 0.0–0.3)
Total Bilirubin: 0.4 mg/dL (ref 0.3–1.2)
Total Protein: 6.9 g/dL (ref 6.0–8.3)

## 2011-03-02 LAB — LIPID PANEL
Cholesterol: 231 mg/dL — ABNORMAL HIGH (ref 0–200)
HDL: 41.3 mg/dL (ref >39.00)
Total CHOL/HDL Ratio: 6
Triglycerides: 308 mg/dL — ABNORMAL HIGH (ref 0.0–149.0)
VLDL: 61.6 mg/dL — ABNORMAL HIGH (ref 0.0–40.0)

## 2011-03-02 LAB — LDL CHOLESTEROL, DIRECT: Direct LDL: 145.1 mg/dL

## 2011-03-02 LAB — HEMOGLOBIN A1C: Hgb A1c MFr Bld: 7.1 % — ABNORMAL HIGH (ref 4.6–6.5)

## 2011-03-02 NOTE — Progress Notes (Signed)
  Subjective:    Patient ID: Brenda Patterson, female    DOB: February 24, 1941, 70 y.o.   MRN: [id number]  HPI Patient seen for medical followup. She has chronic problems including hypertension, hyperlipidemia, and type 2 diabetes. Recurrent ingrown toenail. Recently saw a podiatrist and had nail ablation and that went very well.  Diabetes well controlled by last A1c. No symptoms of hyper or hypoglycemia. Not monitoring blood sugars regularly at home.  Blood pressure has been somewhat challenging to control. She suspects whitecoat syndrome. Compliant with all medications. No recent dizziness.  Lipids treated with lovastatin 20 mg daily. No myalgias.   Review of Systems  Constitutional: Negative for fever, chills and fatigue.  Respiratory: Negative for cough and shortness of breath.   Cardiovascular: Negative for chest pain, palpitations and leg swelling.  Gastrointestinal: Negative for abdominal pain.  Genitourinary: Negative for dysuria.  Neurological: Negative for weakness.  Psychiatric/Behavioral: Negative for dysphoric mood.       Objective:   Physical Exam  Constitutional: She is oriented to person, place, and time. She appears well-developed and well-nourished. No distress.  HENT:  Right Ear: External ear normal.  Left Ear: External ear normal.  Mouth/Throat: Oropharynx is clear and moist. No oropharyngeal exudate.  Neck: No thyromegaly present.  Cardiovascular: Normal rate, regular rhythm and normal heart sounds.  Exam reveals no gallop.   No murmur heard. Pulmonary/Chest: Effort normal and breath sounds normal. No respiratory distress. She has no wheezes. She has no rales.  Musculoskeletal: She exhibits no edema.       No foot lesions. No calluses  Lymphadenopathy:    She has no cervical adenopathy.  Neurological: She is alert and oriented to person, place, and time. No cranial nerve deficit.       Normal sensory function of both feet  Skin: No rash noted.  Psychiatric: She  has a normal mood and affect. Her behavior is normal.          Assessment & Plan:  #1 type 2 diabetes. Reassess A1c. Work on weight loss. Reminder for yearly eye exam. No check of urine microalbumin since she is on high-dose ACE inhibitor #2 hypertension. Probable white coat syndrome. Patient has new home blood pressure monitor and is encouraged to follow closely. By home blood pressure consistently around 130/80 #3 hyperlipidemia. Recheck lipids and hepatic panel today

## 2011-03-03 NOTE — Progress Notes (Signed)
Quick Note:  Called pt and pt is aware of lab results ______

## 2011-03-21 NOTE — H&P (Signed)
NAME:  Brenda Patterson, Brenda Patterson              ACCOUNT NO.:  [id number]   MEDICAL RECORD NO.:  [id number]          PATIENT TYPE:  OBV   LOCATION:  A326                          FACILITY:  [facility]   PHYSICIAN:  Salem Caster, DO    DATE OF BIRTH:  28-Dec-1940   DATE OF ADMISSION:  12/11/2008  DATE OF DISCHARGE:  LH                              HISTORY & PHYSICAL   PRIORITY ADMISSION HISTORY AND PHYSICAL   PRIMARY CARE PHYSICIAN:  Carolann Littler, MD.   CHIEF COMPLAINT:  Nausea and vomiting.   HISTORY OF PRESENT ILLNESS:  This is a 70 year old Caucasian female, who  presents with nausea and vomiting.  Apparently patient began vomiting  around 5 p.m. tonight on her way home from work.  The patient has had  multiple episodes of vomiting and nausea since that time.  The patient  states that she had eaten a salad with chicken at lunch that had a funny  taste.  The patient had gone home and began to have nausea and vomiting  ever since.  The patient has not had any fever, chills, chest pain,  shortness of breath, but is complaining of some abdominal cramping at  this time.  EMS was called, and the patient was given Phenergan on route  without relief.  The patient denies any previous severe episodes and  states that she was feeling well prior to today's episodes of nausea and  vomiting.   PAST MEDICAL HISTORY:  Includes:  1. Hypertension.  2. Diabetes.  3. Arthritis.   PAST SURGICAL HISTORY:  1. Carpal tunnel release.  2. Cholecystectomy.  3. Knee surgery.  4. Tubal ligation.   SOCIAL HISTORY:  No history of smoking, alcohol, or illicit drug use.   PAST FAMILY HISTORY:  Unremarkable.   ALLERGIES:  PENICILLIN.   HOME MEDICATIONS:  1. HCTZ 25 mg once a day.  2. Glipizide 2.5 mg daily.  3. Metformin 500 mg twice a day.  4. Lisinopril 25 mg daily.  5. Zoloft 50 mg once a day.  6. Felodipine 5 mg daily.  7. Celebrex 200 mg daily.  8. Lovastatin 20 mg daily.   REVIEW OF SYSTEMS:  Unable  to assess as the patient is currently  vomiting.   PHYSICAL EXAMINATION:  VITAL SIGNS:  Blood pressure is 156/69, pulse 99,  respirations 20, saturation 98% on room air.  CONSTITUTIONAL:  She is well-developed, well-nourished, well-hydrated in  no acute distress.  HEENT:  Head is atraumatic and normocephalic.  Eyes are PERRLA, EOMI.  NECK:  Soft, supple, nontender, and nondistended.  CARDIOVASCULAR:  Regular rate and rhythm, no murmurs, rubs, or gallops.  LUNGS:  Clear to auscultation bilaterally, no rales, rhonchi, or  wheezing.  ABDOMEN:  Soft, nontender, and nondistended, hyperactive bowel sounds,  no rigidity or guarding.  EXTREMITIES:  No clubbing, cyanosis, or edema.  NEUROLOGIC:  Cranial nerves II-XII are grossly intact.  The patient is  alert and oriented x3.  SKIN:  Normal and no petechiae or rashes are noted.  PSYCHIATRIC:  No major mood changes are noted.   LABORATORY DATA:  Urinalysis:  Trace  of ketones, no nitrites or  leukocyte-esterase.  Sodium 135, potassium 4.0, chloride is 103, CO2 is  23, glucose 228, BUN 20, creatinine 0.83, calcium is 8.3.  White count  is 16.5, hemoglobin 12.9, hematocrit 37.9, platelet count 192,  neutrophils of 90, lymphocytes of 4.   ASSESSMENT:  This is a 70 year old Caucasian female, who presents with  acute onset of nausea and uncontrolled vomiting.  The patient presents  to the emergency room with the above-complaints, has received multiple  doses of Zofran and Phenergan without relief.   PLAN:  1. Patient admitted to the service of [facility] to a general medical      bed.  2. For her nausea and vomiting, the patient will be given IV      antiemetics.  We will try Zofran, Phenergan, and Reglan if needed      to control and have results.  We will place the patient on IV      hydration and continue to IV hydrate her at this time.  Patient      will be placed on a clear liquid diet and will advance as      tolerated.  3. For her acute  diarrhea, we will get stool cultures at this time.      We will hold on any antidiarrheals unless her diarrhea is      uncontrollable.  Depending on her stool cultures, may need to be      placed on antibiotics.  4. For her leukocytosis, unknown source at this time, we will get      urine and blood cultures as well as stool cultures.  Pending the      results of her next white count, may empirically place the patient      on antibiotics.  5. For her history of diabetes, the patient will be placed on her home      medications.  She will be placed on a      sliding scale with night coverage.  Accu-Cheks will be taken a.c.      and h.s.  6. For her history of hypertension, the patient will be placed on her      all her home medications.  7. Lastly, the patient will be on DVT as well as GI prophylaxis.      Salem Caster, DO  Electronically Signed     SM/MEDQ  D:  12/11/2008  T:  12/11/2008  Job:  [id number]   cc:   Carolann Littler, M.D.

## 2011-03-21 NOTE — Discharge Summary (Signed)
NAME:  Brenda Patterson, Brenda Patterson              ACCOUNT NO.:  [id number]   MEDICAL RECORD NO.:  [id number]          PATIENT TYPE:  OBV   LOCATION:  A326                          FACILITY:  [facility]   PHYSICIAN:  Anselmo Pickler, DO    DATE OF BIRTH:  January 18, 1941   DATE OF ADMISSION:  12/11/2008  DATE OF DISCHARGE:  02/06/2010LH                               DISCHARGE SUMMARY   ADMISSION DIAGNOSES:  1. Uncontrolled nausea and vomiting.  2. Diarrhea.  3. Leukocytosis.  4. History of diabetes.  5. History of hypertension.   DISCHARGE DIAGNOSES:  1. Nausea and vomiting, resolved.  2. Diarrhea, resolving.  3. Leukocytosis, resolved.  4. History of diabetes.  5. History of hypertension.   There were no consults that were made.   HOSPITAL COURSE:  She was admitted.  Her H and P was done by Dr.  Sherryle Lis.  Her primary care physician is Dr. Elease Hashimoto.  The patient is  a 70 year old Caucasian female who presented with the above complaints  of nausea and vomiting.  She apparently had a brought-in meal by a drug  rep earlier that day and then by the time she left work she started to  have nausea and started to vomit and have diarrhea.  She stated that she  had had rice, chicken, and salad.  She was admitted to the service of  [facility].  She was given IV hydration.  Stool cultures were taken.  Within 24 hours she actually improved and stated that she felt like she  could go home.  It was agreed, with the patient stating an understanding  that we would send her home on her regular medications which consist of  HCTZ 25 mg once a day, glipizide 2.5 mg daily, metformin 500 mg twice a  day, lisinopril 25 mg daily, Zoloft 50 mg once a day, felodipine 5 mg  daily, Celebrex 200 mg daily and lovastatin 20 mg daily.  Also, her new  medication would include Phenergan 12.5.   Her discharge instructions are to follow up with Dr. Elease Hashimoto in about  2-3 days.  She is to continue on a bland diet for next 3 days.  Also  to  encourage fluids, sipping very small amounts at any given time and if  she has diarrhea at this point in time, she can take some Imodium if  needed.  She is to return if symptoms worsen.  The patient stated  understanding.  Her condition is stable and her disposition is to home.      Anselmo Pickler, DO  Electronically Signed     CB/MEDQ  D:  12/12/2008  T:  12/12/2008  Job:  [id number]

## 2011-03-24 NOTE — Op Note (Signed)
[facility]  Patient:    Brenda Patterson, Brenda Patterson Visit Number: [id number] MRN: [id number]          Service Type: DSU Location: DAY Attending Physician:  Harl Bowie Dictated by:   Marco Collie Ninfa Linden, M.D. Proc. Date: 01/06/02 Admit Date:  01/06/2002   CC:         Nelwyn Salisbury, M.D.   Operative Report  PREOPERATIVE DIAGNOSIS:  Abdominal pain of uncertain etiology.  POSTOPERATIVE DIAGNOSIS:  Abdominal pain of uncertain etiology.  PROCEDURE:  Diagnostic laparoscopy with laparoscopic cholecystectomy.  SURGEON:  Douglas A. Ninfa Linden, M.D.  ASSISTANT:  Sammuel Hines. Daiva Nakayama, M.D.  ANESTHESIA:  General endotracheal and 0.25% Marcaine plain.  ESTIMATED BLOOD LOSS:  Minimal.  FINDINGS:  The patient was found to have a chronically scarred-appearing gallbladder.  PROCEDURE IN DETAIL:  The patient was brought to the operating room and identified as Brenda Patterson.  She was placed supine on operating room table, and general anesthesia was induced.  Her abdomen was then prepped and draped in the usual sterile fashion.  Using a #15 blade, a small transverse incision was made below the umbilicus.  The incision was carried down to the fascia with a scalpel.  A hemostat was then used to pass into the peritoneal cavity. A 0 Vicryl pursestring suture was then placed around the fascial opening.  The Hasson port was placed into the opening, and insufflation of the abdomen was begun.  Next, an 11 mm port was placed in the patients epigastrium, and two 5 mm ports were placed in the patients right flank all under direct vision. The laparoscope was then used to view the abdomen.  No pathology was identified other than the gallbladder appeared chronically scarred.  The gallbladder was then grasped and retracted above the liver bed.  Dissection was then carried out at the base of the gallbladder.  The cystic duct was dissected out and clipped three times proximally  and once distally and transected with the scissors.  The cystic artery was then dissected out and clipped twice proximally and once distally and transected as well.  The gallbladder was then dissected free from the liver bed with the electrocautery.  Hemostasis appeared to be achieved in the liver bed.  Once the gallbladder was removed, it was placed in an Endosac and removed through the umbilicus.  The 0 Vicryl at the umbilicus was then tied down, closing the fascial defect.  The abdomen was again examined, and no further pathology was identified.  Again, hemostasis appeared to be achieved in the liver bed.  The abdomen was then copiously irrigated with normal saline.  All ports were then removed under direct vision, and the abdomen was deflated.  All skin incisions were then anesthetized with 0.25% Marcaine and then closed with 4-0 Monocryl subcuticular sutures.  Steri-Strips, gauze, and tape were then applied.  The patient tolerated the procedure well.  All sponge, needle, and instrument counts were correct at the end of the procedure.  The patient was then extubated in the operating room and taken in stable condition to the recovery room. Dictated by:   Marco Collie Ninfa Linden, M.D. Attending Physician:  Harl Bowie DD:  01/06/02 TD:  01/06/02 Job: [id number]

## 2011-03-24 NOTE — Procedures (Signed)
[facility]  Patient:    [name], Brenda Patterson Visit Number: [id number] MRN: [id number]          Service Type: END Location: ENDO Attending Physician:  Juanita Craver Dictated by:   Nelwyn Salisbury, M.D. Proc. Date: 10/21/01 Admit Date:  10/21/2001   CC:         Silvestre Moment, M.D.  Margarita Rana, M.D.   Procedure Report  DATE OF BIRTH:  [birth year]  REFERRING PHYSICIAN:  Silvestre Moment, M.D.  PROCEDURE PERFORMED:  Colonoscopy with snare polypectomy x 3.  ENDOSCOPIST:  Nelwyn Salisbury, M.D.  INSTRUMENT USED:  Olympus video colonoscope.  INDICATIONS FOR PROCEDURE:  The patient is a  70 year old white female with a history of rectal bleeding, rule out colonic polyps, masses, hemorrhoids, etc.  PREPROCEDURE PREPARATION:  Informed consent was procured from the patient. The patient was fasted for eight hours prior to the procedure and prepped with a bottle of magnesium citrate and a gallon of NuLytely the night prior to the procedure.  PREPROCEDURE PHYSICAL:  The patient had stable vital signs.  Neck supple. Chest clear to auscultation.  S1, S2 regular.  Abdomen soft with normal abdominal bowel sounds.  DESCRIPTION OF PROCEDURE:  The patient was placed in the left lateral decubitus position and sedated with an additional 10 mg of Demerol and 1 mg of Versed intravenously.  Once the patient was adequately sedated and maintained on low-flow oxygen and continuous cardiac monitoring, the Olympus video colonoscope was advanced from the rectum to the cecum without difficulty. Three small sessile polyps were snared from 50 cm.  There was a very small polyp that was coagulated at this time.  Two larger polyps were retrieved. There were bleeding internal hemorrhoids appreciated on retroflexion in the rectum.  There was no evidence of diverticulosis.  The rest of the colonic mucosa up to the cecum appeared normal.  IMPRESSION: 1. Three colonic polyps  at 50 cm.  Two snared, one ablated. 2. Bleeding internal hemorrhoids. 3. No evidence of large masses or diverticulosis.  RECOMMENDATIONS: 1. Avoid all nonsteroidals for now. 2. Await pathology results. 3. Anusol HC 2.5% suppositories are being called to the patients pharmacy.    #30 have been prescribed with one refill. 5. Sitz baths. 6. Outpatient follow-up in the next two weeks.Dictated by:   Nelwyn Salisbury, M.D. Attending Physician:  Juanita Craver DD:  10/21/01 TD:  10/22/01 Job: [id number]

## 2011-03-24 NOTE — Op Note (Signed)
[place]. [facility]  Patient:    Brenda Patterson, Brenda Patterson Visit Number: [id number] MRN: [id number]          Service Type: SUR Location: 4W [id number] Attending Physician:  Harl Bowie Dictated by:   Coralie Keens, M.D. Proc. Date: 02/04/02 Admit Date:  01/06/2002 Discharge Date: 01/07/2002                             Operative Report  PREOPERATIVE DIAGNOSIS:  Carcinoid tumor the gallbladder.  POSTOPERATIVE DIAGNOSIS:  Carcinoid tumor the gallbladder.  PROCEDURES: 1. Exploratory laparotomy. 2. Resection of cystic duct stump. 3. Paraduodenal lymph node biopsy.  SURGEON:  Coralie Keens, M.D.  ASSISTANT:  Shellia Carwin, M.D.  ANESTHESIA:  General endotracheal anesthesia.  ESTIMATED BLOOD LOSS:  Minimal.  INDICATIONS:  Brenda Patterson is a 70 year old female who underwent a laparoscopic cholecystectomy.  A surprise finding on the pathology indicated a 6 mm carcinoid tumor in the cystic duct that was near the surgical clips, so margins could not be assessed.  Secondary to this and discussion with the hospital cancer conference, the decision has been made to proceed with cystic duct resection.  FINDINGS:  The patient was found on frozen section to have no residual tumor in the cystic duct stump.  DESCRIPTION OF PROCEDURE:  The patient was brought to the operating room and identified as Brenda Patterson.  She was placed on the operating table and general anesthesia was induced.  Her abdomen was then prepped and draped in the usual sterile fashion.  Using a #10 blade, a right subcostal incision was created.  The incision was carried down through the rectus muscle with the electrocautery.  The peritoneum was then opened the entire length of the incision.  Upon entering the abdomen, the patient was found to have adhesions of colon to the undersurface of the liver.  These were taken down with Metzenbaum scissors.  Dissection was then carried out  in the gallbladder fossa.  The common duct was identified.  Extensive scarring from the previous surgery was identified and this was slowly dissected out with the aid of Metzenbaum scissors and right angle clamps.  The cystic stump was then finally identified.  Its base was tied off with a 2-0 silk tie and a 3-0 silk suture ligature and then transected.  The residual cystic duct just distal to this along with surrounding scar tissue was then completely excised and sent to pathology for identification.  The common duct and right and left hepatic ducts were easily identified during the dissection.  Several small bridging vessels were tied off with silk ties and sutures.  Frozen section on the cystic duct specimen revealed no residual tumor.  A large paraduodenal lymph node was identified and dissected out circumferentially with electrocautery and excised and sent to pathology as well.  The base was tied off with several silk suture ligatures as well.  The abdomen was then copiously irrigated with normal saline.  No other pathology appeared to be identified.  A separate skin incision was made and a 79 [place] round Blake drain was placed into the gallbladder fossa near the cystic duct resection.  The drain was tied in with a 3-0 nylon suture.  Next, the posterior fascia was closed with a running 1-0 PDS suture.  The anterior fascia was likewise closed with a running 1-0 PDS suture.  The skin was then irrigate and closed with skin staples.  The  patient tolerated the procedure well.  All sponge, needle, and instrument counts were correct at the end of the procedure.  The patient was then extubated in the operating room and taken in stable condition to the recovery room. Dictated by:   Coralie Keens, M.D. Attending Physician:  Harl Bowie DD:  02/04/02 TD:  02/04/02 Job: [id number]

## 2011-03-24 NOTE — Discharge Summary (Signed)
. [facility]  Patient:    Brenda Patterson, Brenda Patterson Visit Number: [id number] MRN: [id number]          Service Type: SUR Location: [id number] Attending Physician:  Harl Bowie Dictated by:   Coralie Keens, M.D. Admit Date:  02/04/2002 Discharge Date: 02/08/2002                             Discharge Summary  HISTORY OF PRESENT ILLNESS: The patient is a 70 year old female who had undergone a laparoscopic cholecystectomy several weeks prior to this admission. She was found to have surprisingly to have a carcinoid tumor in the cystic duct that margins could not be evaluated. After a long discussion with the tumor board, the decision was made to proceed with exploratory laparotomy and cystic stump resection.  HOSPITAL COURSE: The patient was admitted on February 07, 2002, and taken to the operating room where she underwent exploratory laparotomy with resection of her cystic stump and a paraduodenal lymph node biopsy. She tolerated the procedure well and was taken in stable condition to the floor with a single Blake drain in place. Frozen section revealed no evidence of residual tumor in the cystic stump.  On postoperative day #1 the patient was doing quite well. Her Jackson-Pratt drain was draining serosanguinous fluid. Her white blood cell count at that time was 7.6 with a hemoglobin of 11.5 and a bilirubin of 0.7. She was started on Glucotrol at this point to control her diabetes.  By postoperative day #2 she was doing quite well except for a temperature which was felt to be secondary to atelectasis. Pulmonary toilet was instituted. At this point her Foley catheter was removed and her diet was advanced.  By postoperative day #3 she did complain of some mild bloating and there was some mild increase of her drain output. Her abdomen was otherwise benign. At this point a Dulcolax suppository was given. The patient began moving her bowels  well.  By February 08, 2002, she was tolerating a regular diet and ambulating quite well and her incision was healing well. The decision was made to remove her drain and discharge the patient to home.  DISCHARGE DIAGNOSIS: Carcinoid tumor of the gallbladder, status post exploratory laparotomy with resection of the cystic stump and paraduodenal lymph node biopsy.  DISCHARGE INSTRUCTIONS: Discharge diet regular. Discharge activity, she is to do no heavy lifting. She may shower.  DISCHARGE MEDICATIONS: She is to resume her home medications.  She will take Tylox for pain.  FOLLOW UP: She will follow up in my office in one week post discharge. Dictated by:   Coralie Keens, M.D. Attending Physician:  Harl Bowie DD:  03/21/02 TD:  03/24/02 Job: [id number]

## 2011-03-24 NOTE — Procedures (Signed)
[place]. [facility]  Patient:    Brenda Patterson, Brenda Patterson Visit Number: [id number] MRN: [id number]          Service Type: END Location: ENDO Attending Physician:  Juanita Craver Dictated by:   Nelwyn Salisbury, M.D. Proc. Date: 10/21/01 Admit Date:  10/21/2001   CC:         Silvestre Moment, M.D.  Margarita Rana, M.D.   Procedure Report  DATE OF BIRTH:  [birth year]  REFERRING PHYSICIAN:  Silvestre Moment, M.D.  PROCEDURE PERFORMED:  Esophagogastroduodenoscopy.  ENDOSCOPIST:  Nelwyn Salisbury, M.D.  INSTRUMENT USED:  Olympus video panendoscope.  INDICATIONS FOR PROCEDURE:  The patient is a 70 year old white female with a history of epigastric pain and severe reflux, rule out peptic ulcer disease, esophagitis, gastritis, etc.  PREPROCEDURE PREPARATION:  Informed consent was procured from the patient. The patient was fasted for eight hours prior to the procedure.  PREPROCEDURE PHYSICAL:  The patient had stable vital signs.  Neck supple. Chest clear to auscultation.  S1, S2 regular.  Abdomen soft with normal abdominal bowel sounds.  DESCRIPTION OF PROCEDURE:  The patient was placed in left lateral decubitus position and sedated with 50 mg of Demerol and 5 mg of Versed intravenously. Once the patient was adequately sedated and maintained on low-flow oxygen and continuous cardiac monitoring, the Olympus video panendoscope was advanced through the mouthpiece, over the tongue, into the esophagus under direct vision.  The entire esophagus appeared normal without evidence of ring, stricture, masses, lesions or esophagitis or Barretts mucosa.  The scope was then advanced to the stomach.  There was mild antral erythema consistent with antral gastritis.  The rest of the gastric mucosa appeared healthy and without lesions.  The proximal small bowel appeared normal as well.  IMPRESSION:  Mild antral gastritis, otherwise  normal esophagogastroduodenoscopy.  RECOMMENDATION: 1. Continue proton pump inhibitors and antireflux measures. 2. Avoid all nonsteroidals. 3. Proceed with colonoscopy at this time. Dictated by:   Nelwyn Salisbury, M.D. Attending Physician:  Juanita Craver DD:  10/21/01 TD:  10/22/01 Job: [id number]

## 2011-03-30 ENCOUNTER — Other Ambulatory Visit: Payer: Self-pay | Admitting: *Deleted

## 2011-03-30 DIAGNOSIS — E785 Hyperlipidemia, unspecified: Secondary | ICD-10-CM

## 2011-03-30 MED ORDER — LOVASTATIN 20 MG PO TABS: 20.0000 mg | ORAL_TABLET | Freq: Every day | ORAL | Status: DC

## 2011-04-17 ENCOUNTER — Other Ambulatory Visit: Payer: Self-pay | Admitting: Family Medicine

## 2011-04-19 ENCOUNTER — Telehealth: Payer: Self-pay | Admitting: *Deleted

## 2011-04-19 MED ORDER — MELOXICAM 15 MG PO TABS: 15.0000 mg | ORAL_TABLET | Freq: Every day | ORAL | Status: DC

## 2011-04-19 NOTE — Telephone Encounter (Signed)
Pt aware of this. Rx sent to [facility].

## 2011-04-19 NOTE — Telephone Encounter (Signed)
Pt called saying that arthritis is acting up. She would like something in a generic that is similar to celebrex. Celebrex cost too much for her. [facility] [place].

## 2011-04-19 NOTE — Telephone Encounter (Signed)
There is no generic equivalent to Celebrex.  Closest would be meloxicam 15 mg po qd #30 with 2 refills.

## 2011-05-13 ENCOUNTER — Other Ambulatory Visit: Payer: Self-pay | Admitting: Family Medicine

## 2011-05-19 ENCOUNTER — Other Ambulatory Visit: Payer: Self-pay | Admitting: Family Medicine

## 2011-06-14 ENCOUNTER — Emergency Department (HOSPITAL_COMMUNITY)
Admission: EM | Admit: 2011-06-14 | Discharge: 2011-06-14 | Disposition: A | Discharge status: Home / Self Care | Payer: Medicare Other | Attending: Emergency Medicine | Admitting: Emergency Medicine

## 2011-06-14 ENCOUNTER — Encounter (HOSPITAL_COMMUNITY): Payer: Self-pay

## 2011-06-14 ENCOUNTER — Emergency Department (HOSPITAL_COMMUNITY): Payer: Medicare Other

## 2011-06-14 DIAGNOSIS — W010XXA Fall on same level from slipping, tripping and stumbling without subsequent striking against object, initial encounter: Secondary | ICD-10-CM | POA: Insufficient documentation

## 2011-06-14 DIAGNOSIS — S20219A Contusion of unspecified front wall of thorax, initial encounter: Secondary | ICD-10-CM

## 2011-06-14 DIAGNOSIS — Z7982 Long term (current) use of aspirin: Secondary | ICD-10-CM | POA: Insufficient documentation

## 2011-06-14 DIAGNOSIS — E785 Hyperlipidemia, unspecified: Secondary | ICD-10-CM | POA: Insufficient documentation

## 2011-06-14 DIAGNOSIS — I10 Essential (primary) hypertension: Secondary | ICD-10-CM | POA: Insufficient documentation

## 2011-06-14 DIAGNOSIS — Z79899 Other long term (current) drug therapy: Secondary | ICD-10-CM | POA: Insufficient documentation

## 2011-06-14 DIAGNOSIS — E119 Type 2 diabetes mellitus without complications: Secondary | ICD-10-CM | POA: Insufficient documentation

## 2011-06-14 MED ORDER — HYDROCODONE-ACETAMINOPHEN 5-500 MG PO TABS: 1.0000 | ORAL_TABLET | ORAL | Status: AC | PRN

## 2011-06-14 NOTE — ED Notes (Signed)
Pt complain of pain in ribs. States she fell Saturday

## 2011-06-14 NOTE — ED Provider Notes (Signed)
History     CSN: [id number] Arrival date & time: 06/14/2011  2:29 PM  Chief Complaint  Patient presents with  . Rib Injury   HPI Comments: Patient c/o bilateral rib pain after she tripped fell 4 days ago and landed forward.  States the pain is worse on the right anterior ribs worse than left.  She denies shortness of breath, numbness, weakness or abd pain.  She also denies LOC, neck pain or vomiting.  PAin is worsen with movement, coughing and position change.    Patient is a 70 y.o. female presenting with chest pain. The history is provided by the patient.  Chest Pain The chest pain began 3 - 5 days ago. Chest pain occurs constantly. The chest pain is unchanged. The pain is associated with breathing (movement). The severity of the pain is mild. The quality of the pain is described as aching and sharp. The pain does not radiate. Chest pain is worsened by certain positions and deep breathing. Pertinent negatives for primary symptoms include no fever, no syncope, no shortness of breath, no cough, no wheezing, no palpitations, no abdominal pain, no nausea, no vomiting and no dizziness.  Pertinent negatives for associated symptoms include no lower extremity edema, no numbness, no orthopnea and no weakness. She tried nothing for the symptoms.     Past Medical History  Diagnosis Date  . Allergy   . Diabetes mellitus   . Hypertension   . Hyperlipidemia   . Arthritis   . Migraines     Past Surgical History  Procedure Date  . Tubal ligation   . Cholecystectomy     Family History  Problem Relation Age of Onset  . Arthritis      fhx  . Hyperlipidemia      fhx  . Hypertension      fhx  . Diabetes      fhx  . Stroke      fhx    History  Substance Use Topics  . Smoking status: Never Smoker   . Smokeless tobacco: Not on file  . Alcohol Use: No    OB History    Grav Para Term Preterm Abortions TAB SAB Ect Mult Living                  Review of Systems  Constitutional:  Negative for fever and appetite change.  HENT: Negative for facial swelling, neck pain and neck stiffness.   Respiratory: Negative for apnea, cough, chest tightness, shortness of breath and wheezing.   Cardiovascular: Positive for chest pain. Negative for palpitations, orthopnea, leg swelling and syncope.  Gastrointestinal: Negative.  Negative for nausea, vomiting and abdominal pain.  Genitourinary: Negative for dysuria, hematuria and flank pain.  Musculoskeletal: Negative for back pain and gait problem.  Skin: Negative.   Neurological: Negative for dizziness, weakness, light-headedness, numbness and headaches.  Hematological: Does not bruise/bleed easily.    Physical Exam  BP 175/70  Pulse 77  Temp(Src) 98.1 F (36.7 C) (Oral)  Resp 20  Ht 5\' 5"  (1.651 m)  Wt 227 lb (102.967 kg)  BMI 37.77 kg/m2  SpO2 100%  Physical Exam  Nursing note and vitals reviewed. Constitutional: She is oriented to person, place, and time. She appears well-developed and well-nourished. No distress.  HENT:  Head: Normocephalic and atraumatic.  Right Ear: External ear normal.  Left Ear: External ear normal.  Mouth/Throat: Oropharynx is clear and moist.  Eyes: EOM are normal.  Neck: Normal range of motion. Neck supple.  No thyromegaly present.  Cardiovascular: Normal rate, regular rhythm and normal heart sounds.   Pulmonary/Chest: Effort normal and breath sounds normal. No respiratory distress. She has no rales. Chest wall is not dull to percussion. She exhibits tenderness. She exhibits no laceration, no crepitus, no edema, no deformity, no swelling and no retraction.    Abdominal: Soft. She exhibits no distension and no mass. There is no tenderness. There is no rebound and no guarding.  Musculoskeletal: She exhibits tenderness. She exhibits no edema.  Lymphadenopathy:    She has no cervical adenopathy.  Neurological: She is alert and oriented to person, place, and time. She has normal reflexes. No  cranial nerve deficit. She exhibits normal muscle tone. Coordination normal.  Skin: Skin is warm and dry.    ED Course  Procedures  MDM  1600  I have reviewed the x-ray results with the patient.  ttp of the anterior lower ribs. Right>left. No crepitus or deformity.  Possible occult rib fx.  Abd is soft, NT.  No guarding or rebound, no tachycardia, tachypnea or hypoxia.      Dg Chest 2 View  06/14/2011  *RADIOLOGY REPORT*  Clinical Data: Fall.  Chest pain.  CHEST - 2 VIEW  Comparison: 12/22/2006.  Findings: Cardiopericardial silhouette is within normal limits for projection.  Trachea midline.  Mild bilateral basilar atelectasis. Postoperative changes of right rotator cuff repair.  Left AC joint osteoarthritis.  There is no airspace disease.  No effusion. Thoracolumbar spondylosis.  IMPRESSION: No active cardiopulmonary disease.  Original Report Authenticated By: Dereck Ligas, M.D.       Tammy L. Compo, [place] 06/14/11 1654  I performed a history and physical examination of SULY SCHRICKER and discussed her management with Dr. Randal Buba.  I agree with the history, physical, assessment, and plan of care, with the following exceptions: None  I was present for the following procedures: None Time Spent in Critical Care of the patient: None Time spent in discussions with the patient and family:5 minutes  Toshi Ishii-RASCH,Onedia Vargus K   Sibbie Flammia K Milarose Savich-Rasch, MD 06/14/11 [phone]

## 2011-07-07 ENCOUNTER — Emergency Department (HOSPITAL_COMMUNITY): Payer: Medicare Other

## 2011-07-07 ENCOUNTER — Encounter (HOSPITAL_COMMUNITY): Payer: Self-pay | Admitting: *Deleted

## 2011-07-07 ENCOUNTER — Emergency Department (HOSPITAL_COMMUNITY)
Admission: EM | Admit: 2011-07-07 | Discharge: 2011-07-08 | Disposition: A | Discharge status: Home / Self Care | Payer: Medicare Other | Attending: Emergency Medicine | Admitting: Emergency Medicine

## 2011-07-07 DIAGNOSIS — R109 Unspecified abdominal pain: Secondary | ICD-10-CM | POA: Insufficient documentation

## 2011-07-07 DIAGNOSIS — R112 Nausea with vomiting, unspecified: Secondary | ICD-10-CM | POA: Insufficient documentation

## 2011-07-07 DIAGNOSIS — Z7982 Long term (current) use of aspirin: Secondary | ICD-10-CM | POA: Insufficient documentation

## 2011-07-07 DIAGNOSIS — Z79899 Other long term (current) drug therapy: Secondary | ICD-10-CM | POA: Insufficient documentation

## 2011-07-07 DIAGNOSIS — M129 Arthropathy, unspecified: Secondary | ICD-10-CM | POA: Insufficient documentation

## 2011-07-07 DIAGNOSIS — E1169 Type 2 diabetes mellitus with other specified complication: Secondary | ICD-10-CM | POA: Insufficient documentation

## 2011-07-07 DIAGNOSIS — I10 Essential (primary) hypertension: Secondary | ICD-10-CM | POA: Insufficient documentation

## 2011-07-07 DIAGNOSIS — E785 Hyperlipidemia, unspecified: Secondary | ICD-10-CM | POA: Insufficient documentation

## 2011-07-07 LAB — COMPREHENSIVE METABOLIC PANEL
ALT: 20 U/L (ref 0–35)
AST: 20 U/L (ref 0–37)
Albumin: 3.5 g/dL (ref 3.5–5.2)
Alkaline Phosphatase: 103 U/L (ref 39–117)
BUN: 20 mg/dL (ref 6–23)
CO2: 26 mEq/L (ref 19–32)
Calcium: 8.8 mg/dL (ref 8.4–10.5)
Chloride: 104 mEq/L (ref 96–112)
Creatinine, Ser: 0.86 mg/dL (ref 0.50–1.10)
GFR calc Af Amer: >60 mL/min (ref >60)
GFR calc non Af Amer: >60 mL/min (ref >60)
Glucose, Bld: 172 mg/dL — ABNORMAL HIGH (ref 70–99)
Potassium: 3.9 mEq/L (ref 3.5–5.1)
Sodium: 141 mEq/L (ref 135–145)
Total Bilirubin: 0.3 mg/dL (ref 0.3–1.2)
Total Protein: 6.8 g/dL (ref 6.0–8.3)

## 2011-07-07 LAB — URINALYSIS, ROUTINE W REFLEX MICROSCOPIC
Bilirubin Urine: NEGATIVE
Glucose, UA: NEGATIVE mg/dL
Hgb urine dipstick: NEGATIVE
Ketones, ur: NEGATIVE mg/dL
Leukocytes, UA: NEGATIVE
Nitrite: NEGATIVE
Protein, ur: NEGATIVE mg/dL
Specific Gravity, Urine: 1.02 (ref 1.005–1.030)
Urobilinogen, UA: 0.2 mg/dL (ref 0.0–1.0)
pH: 6 (ref 5.0–8.0)

## 2011-07-07 LAB — GLUCOSE, CAPILLARY: Glucose-Capillary: 206 mg/dL — ABNORMAL HIGH (ref 70–99)

## 2011-07-07 LAB — LACTIC ACID, PLASMA: Lactic Acid, Venous: 1.2 mmol/L (ref 0.5–2.2)

## 2011-07-07 LAB — CBC
HCT: 34.7 % — ABNORMAL LOW (ref 36.0–46.0)
Hemoglobin: 11.6 g/dL — ABNORMAL LOW (ref 12.0–15.0)
MCH: 28.4 pg (ref 26.0–34.0)
MCHC: 33.4 g/dL (ref 30.0–36.0)
MCV: 84.8 fL (ref 78.0–100.0)
Platelets: 200 10*3/uL (ref 150–400)
RBC: 4.09 MIL/uL (ref 3.87–5.11)
RDW: 13.1 % (ref 11.5–15.5)
WBC: 9.1 10*3/uL (ref 4.0–10.5)

## 2011-07-07 LAB — LIPASE, BLOOD: Lipase: 346 U/L — ABNORMAL HIGH (ref 11–59)

## 2011-07-07 MED ORDER — ONDANSETRON HCL 4 MG/2ML IJ SOLN
4.0000 mg | Freq: Once | INTRAMUSCULAR | Status: AC
  Administered 2011-07-07: 4 mg via INTRAVENOUS
  Filled 2011-07-07: qty 2

## 2011-07-07 MED ORDER — ONDANSETRON HCL 4 MG PO TABS: 4.0000 mg | ORAL_TABLET | Freq: Four times a day (QID) | ORAL | Status: AC

## 2011-07-07 MED ORDER — IOHEXOL 300 MG/ML  SOLN
100.0000 mL | Freq: Once | INTRAMUSCULAR | Status: AC | PRN
  Administered 2011-07-07: 100 mL via INTRAVENOUS

## 2011-07-07 MED ORDER — SODIUM CHLORIDE 0.9 % IV SOLN: Freq: Once | INTRAVENOUS | Status: DC

## 2011-07-07 MED ORDER — HYDROCODONE-ACETAMINOPHEN 5-325 MG PO TABS: 2.0000 | ORAL_TABLET | ORAL | Status: AC | PRN

## 2011-07-07 MED ORDER — SODIUM CHLORIDE 0.9 % IV BOLUS (SEPSIS)
1000.0000 mL | Freq: Once | INTRAVENOUS | Status: AC
  Administered 2011-07-07: 1000 mL via INTRAVENOUS

## 2011-07-07 NOTE — ED Provider Notes (Signed)
Scribed for Ezequiel Essex, MD, the patient was seen in room APA11/APA11. This chart was scribed by [employer]. The patient's care started at 20:11  CSN: [id number] Arrival date & time: 07/07/2011  8:08 PM  Chief Complaint  Patient presents with  . Hypoglycemia   HPI SARRIAH LIME is a 70 y.o. female BIBA with a history of diabetes mellitus and HTN, who presents to the Emergency Department complaining of hypoglycemia and nausea onset  5pm 07/07/2011.  Associated symptoms include nausea, vomiting, diarrhea and "feeling clammy". Patient states she tried "eating a lot to get her blood sugar up after it fell to 53. Patient reports sudden abdominal pain after excessive eating. Patient denies chest pain, headaches or visual disturbance. Patient has a surgical history of tubal ligation and cholecystectomy. HPI ELEMENTS:  Onset: 5pm 07/07/2011 Duration: 4 hours  Timing: constant Modifying factors: not alleviated by food intake Context:  as above  Associated symptoms: as above    Past Medical History  Diagnosis Date  . Allergy   . Diabetes mellitus   . Hypertension   . Hyperlipidemia   . Arthritis   . Migraines    MEDICATIONS:  Previous Medications   ASPIRIN 81 MG TABLET    Take 81 mg by mouth daily.     ASPIRIN EC 81 MG TABLET    Take 81 mg by mouth daily.     CALCIUM CARBONATE (CALCIUM 600 PO)    Take 600-900 mg by mouth daily. Take one tablet by mouth every morning and take one and one-half tablet by mouth at bedtime    CALCIUM CARBONATE (CALCIUM 600) 1500 MG TABS    Take by mouth daily.    CARVEDILOL (COREG) 6.25 MG TABLET    Take 6.25 mg by mouth 2 (two) times daily with a meal.     CHOLECALCIFEROL (VITAMIN D) 1000 UNITS TABLET    Take 1,000 Units by mouth daily.     CYANOCOBALAMIN (VITAMIN B-12) 1000 MCG SUBL    Place 1,000 mcg under the tongue daily.     FELODIPINE (PLENDIL) 5 MG 24 HR TABLET    Take 5 mg by mouth daily.     FEXOFENADINE (ALLEGRA) 180 MG TABLET    Take 180  mg by mouth daily.     FISH OIL-OMEGA-3 FATTY ACIDS 1000 MG CAPSULE    Take 1 g by mouth daily.    GLIPIZIDE (GLUCOTROL) 2.5 MG 24 HR TABLET    TAKE ONE TABLET BY MOUTH EVERY DAY   HYDROCHLOROTHIAZIDE 25 MG TABLET    Take 12.5 mg by mouth daily.    LISINOPRIL (PRINIVIL,ZESTRIL) 40 MG TABLET    TAKE ONE TABLET BY MOUTH EVERY DAY   LOVASTATIN (MEVACOR) 20 MG TABLET    Take 20 mg by mouth at bedtime.     MELOXICAM (MOBIC) 15 MG TABLET    Take 15 mg by mouth daily as needed.     METFORMIN (GLUCOPHAGE) 500 MG TABLET    TAKE TWO TABLETS BY MOUTH IN THE MORNING AND ONE IN THE EVENING   MULTIPLE VITAMIN (MULTIVITAMIN) TABLET    Take 1 tablet by mouth daily.     MULTIPLE VITAMINS-IRON (STRESS FORMULA/IRON) TABS    Take 1 tablet by mouth daily.     SERTRALINE (ZOLOFT) 50 MG TABLET    Take 25 mg by mouth daily.     VITAMIN E 1000 UNIT CAPSULE    Take 1,000 Units by mouth daily.       ALLERGIES:  Allergies as of 07/07/2011 - Review Complete 07/07/2011  Allergen Reaction Noted  . Penicillins Rash 02/23/2009      Past Surgical History  Procedure Date  . Tubal ligation   . Cholecystectomy     Family History  Problem Relation Age of Onset  . Arthritis      fhx  . Hyperlipidemia      fhx  . Hypertension      fhx  . Diabetes      fhx  . Stroke      fhx    History  Substance Use Topics  . Smoking status: Never Smoker   . Smokeless tobacco: Not on file  . Alcohol Use: No    OB History    Grav Para Term Preterm Abortions TAB SAB Ect Mult Living                  Review of Systems  Constitutional: Positive for diaphoresis. Negative for activity change and appetite change.  Eyes: Negative for visual disturbance.  Cardiovascular: Negative for chest pain.  Gastrointestinal: Positive for nausea, vomiting and diarrhea.  Neurological: Negative for headaches.  All other systems reviewed and are negative.    Physical Exam  BP 182/76  Pulse 78  Temp(Src) 98.1 F (36.7 C) (Oral)   Resp 18  SpO2 99%  Physical Exam  Nursing note and vitals reviewed. Constitutional: She is oriented to person, place, and time. She appears well-developed and well-nourished. No distress.  HENT:  Head: Normocephalic and atraumatic.  Mouth/Throat: Oropharynx is clear and moist.  Eyes: Conjunctivae are normal. Pupils are equal, round, and reactive to light.  Neck: Normal range of motion. Neck supple. No tracheal deviation present.  Cardiovascular: Normal rate and regular rhythm.   No murmur heard. Pulmonary/Chest: Effort normal and breath sounds normal. No respiratory distress. She has no wheezes.  Abdominal: Soft. Bowel sounds are normal. There is tenderness (mild lower abdominal). There is no rebound and no guarding.  Musculoskeletal: Normal range of motion.  Neurological: She is alert and oriented to person, place, and time. No cranial nerve deficit.  Skin: Skin is warm and dry. No rash noted. No erythema. No pallor.  Psychiatric: She has a normal mood and affect. Her behavior is normal.    ED Course  Procedures  OTHER DATA REVIEWED: Nursing notes, vital signs, and past medical records reviewed.    DIAGNOSTIC STUDIES: Oxygen Saturation is 99% on room air, normal by my interpretation.    LABS / RADIOLOGY: Results for orders placed during the hospital encounter of 07/07/11  CBC      Component Value Range   WBC 9.1  4.0 - 10.5 (K/uL)   RBC 4.09  3.87 - 5.11 (MIL/uL)   Hemoglobin 11.6 (*) 12.0 - 15.0 (g/dL)   HCT 34.7 (*) 36.0 - 46.0 (%)   MCV 84.8  78.0 - 100.0 (fL)   MCH 28.4  26.0 - 34.0 (pg)   MCHC 33.4  30.0 - 36.0 (g/dL)   RDW 13.1  11.5 - 15.5 (%)   Platelets 200  150 - 400 (K/uL)  LACTIC ACID, PLASMA      Component Value Range   Lactic Acid, Venous 1.2  0.5 - 2.2 (mmol/L)  URINALYSIS, ROUTINE W REFLEX MICROSCOPIC      Component Value Range   Color, Urine YELLOW  YELLOW    Appearance CLEAR  CLEAR    Specific Gravity, Urine 1.020  1.005 - 1.030    pH 6.0  5.0 -  8.0    Glucose, UA NEGATIVE  NEGATIVE (mg/dL)   Hgb urine dipstick NEGATIVE  NEGATIVE    Bilirubin Urine NEGATIVE  NEGATIVE    Ketones, ur NEGATIVE  NEGATIVE (mg/dL)   Protein, ur NEGATIVE  NEGATIVE (mg/dL)   Urobilinogen, UA 0.2  0.0 - 1.0 (mg/dL)   Nitrite NEGATIVE  NEGATIVE    Leukocytes, UA NEGATIVE  NEGATIVE   GLUCOSE, CAPILLARY      Component Value Range   Glucose-Capillary 206 (*) 70 - 99 (mg/dL)    Ct Abdomen Pelvis W Contrast  07/07/2011  *RADIOLOGY REPORT*  Clinical Data: Abdominal pain, nausea, and vomiting.  CT ABDOMEN AND PELVIS WITH CONTRAST  Technique:  Multidetector CT imaging of the abdomen and pelvis was performed following the standard protocol during bolus administration of intravenous contrast.  Contrast: 100 ml Omnipaque 300  Comparison: 10/12/2003  Findings: Lung bases are clear.  The liver, spleen, pancreas, bile ducts, adrenal glands, stomach, and small bowel are unremarkable. The gallbladder is not visualized and may be contracted or surgically absent.  Parenchymal cysts in both kidneys measuring less than 2 cm.  No hydronephrosis or solid masses demonstrated. Calcification of nonaneurysmal abdominal aorta.  No retroperitoneal lymphadenopathy.  No free air or free fluid in the abdomen.  Pelvis:  No free or loculated pelvic fluid collections.  The bladder wall is not thickened.  Uterus and adnexal structures are not enlarged.  The appendix is not visualized but no inflammatory changes are present in the right lower quadrant.  No inflammatory changes relevant to the colon.  Degenerative changes in the lumbar spine and hips.  IMPRESSION: No acute process demonstrated in the abdomen or pelvis.  Original Report Authenticated By: Neale Burly, M.D.   ED COURSE / COORDINATION OF CARE: 20:20 - EDMD examined patient and ordered the following Orders Placed This Encounter  Procedures  . CBC  . Comprehensive metabolic panel  . Lipase, blood  . Lactic acid, plasma  .  Urinalysis with microscopic  . Insert peripheral IV     MDM: lower abdominal pain, nausea, vomiting, feels of hypoglycemia, now improved.   No hypoglycemia here.  Minimal lower abdominal pain with nausea.  Elevated lipase noted.  No previous values, no history of pancreatitis.  CT neg for acute process. Ranson's score of 1. Patient tolerating by mouth emergency department, her pain is improved. She has not had no further nausea or vomiting. I discussed her laboratory results including her elevated lipase which she has not had in the past. I don't have a previous comparison but all of her pain is in her lower abdomen and improved from admission.  She states she feels well to go home and follow with her doctor next week. She occurs return if worsening symptoms including inability to tolerate by mouth, worsening pain, fever, or any other concerning symptoms.   MEDICATIONS GIVEN IN THE E.D.  Medications  sodium chloride 0.9 % bolus 1,000 mL (not administered)  ondansetron (ZOFRAN) injection 4 mg (not administered)  0.9 %  sodium chloride infusion (not administered)   SCRIBE ATTESTATION: I personally performed the services described in this documentation, which was scribed in my presence.  The recorded information has been reviewed and considered.   Ezequiel Essex, MD 07/08/11 [phone]

## 2011-07-07 NOTE — ED Notes (Signed)
Pt received via [facility]  Secondary to hypoglycemia, n/v. Chills. Pt states has not felt good all day.

## 2011-07-08 NOTE — ED Notes (Signed)
Pt exited prior to signing DC

## 2011-08-17 ENCOUNTER — Other Ambulatory Visit: Payer: Self-pay | Admitting: Family Medicine

## 2011-08-23 ENCOUNTER — Other Ambulatory Visit: Payer: Self-pay | Admitting: Family Medicine

## 2011-08-24 LAB — I-STAT 8, (EC8 V) (CONVERTED LAB)
Acid-Base Excess: 3 — ABNORMAL HIGH
BUN: 16
Bicarbonate: 26.2 — ABNORMAL HIGH
Chloride: 104
Glucose, Bld: 188 — ABNORMAL HIGH
HCT: 38
Hemoglobin: 12.9
Operator id: 288331
Potassium: 3.4 — ABNORMAL LOW
Sodium: 137
TCO2: 27
pCO2, Ven: 34.1 — ABNORMAL LOW
pH, Ven: 7.494 — ABNORMAL HIGH

## 2011-08-24 LAB — POCT I-STAT CREATININE
Creatinine, Ser: 1.2
Operator id: 288331

## 2011-09-01 ENCOUNTER — Encounter: Payer: Self-pay | Admitting: Family Medicine

## 2011-09-01 ENCOUNTER — Ambulatory Visit (INDEPENDENT_AMBULATORY_CARE_PROVIDER_SITE_OTHER): Payer: Medicare Other | Admitting: Family Medicine

## 2011-09-01 DIAGNOSIS — E119 Type 2 diabetes mellitus without complications: Secondary | ICD-10-CM

## 2011-09-01 DIAGNOSIS — E785 Hyperlipidemia, unspecified: Secondary | ICD-10-CM

## 2011-09-01 DIAGNOSIS — I10 Essential (primary) hypertension: Secondary | ICD-10-CM

## 2011-09-01 DIAGNOSIS — M1990 Unspecified osteoarthritis, unspecified site: Secondary | ICD-10-CM

## 2011-09-01 LAB — BASIC METABOLIC PANEL
BUN: 23 mg/dL (ref 6–23)
CO2: 28 mEq/L (ref 19–32)
Calcium: 9 mg/dL (ref 8.4–10.5)
Chloride: 105 mEq/L (ref 96–112)
Creatinine, Ser: 1.3 mg/dL — ABNORMAL HIGH (ref 0.4–1.2)
GFR: 44.27 mL/min — ABNORMAL LOW (ref >60.00)
Glucose, Bld: 144 mg/dL — ABNORMAL HIGH (ref 70–99)
Potassium: 4.1 mEq/L (ref 3.5–5.1)
Sodium: 141 mEq/L (ref 135–145)

## 2011-09-01 LAB — HEPATIC FUNCTION PANEL
ALT: 19 U/L (ref 0–35)
AST: 21 U/L (ref 0–37)
Albumin: 3.7 g/dL (ref 3.5–5.2)
Alkaline Phosphatase: 77 U/L (ref 39–117)
Bilirubin, Direct: 0 mg/dL (ref 0.0–0.3)
Total Bilirubin: 0.5 mg/dL (ref 0.3–1.2)
Total Protein: 7 g/dL (ref 6.0–8.3)

## 2011-09-01 LAB — LIPID PANEL
Cholesterol: 210 mg/dL — ABNORMAL HIGH (ref 0–200)
HDL: 38.7 mg/dL — ABNORMAL LOW (ref >39.00)
Total CHOL/HDL Ratio: 5
Triglycerides: 282 mg/dL — ABNORMAL HIGH (ref 0.0–149.0)
VLDL: 56.4 mg/dL — ABNORMAL HIGH (ref 0.0–40.0)

## 2011-09-01 LAB — HEMOGLOBIN A1C: Hgb A1c MFr Bld: 7.3 % — ABNORMAL HIGH (ref 4.6–6.5)

## 2011-09-01 LAB — LDL CHOLESTEROL, DIRECT: Direct LDL: 138.7 mg/dL

## 2011-09-01 MED ORDER — MELOXICAM 15 MG PO TABS: 15.0000 mg | ORAL_TABLET | Freq: Every day | ORAL | Status: DC | PRN

## 2011-09-01 NOTE — Progress Notes (Signed)
  Subjective:    Patient ID: Brenda Patterson, female    DOB: Aug 31, 1941, 70 y.o.   MRN: [id number]  HPI  Medical followup. Patient has history of type 2 diabetes, hyperlipidemia, and hypertension. Medications reviewed. Compliant with all. She has lost 2 more pounds since last visit. Blood sugars relatively stable. One episode of hypoglycemia several months ago which prompted emergency room visit. None since then. Last A1c 7.1%. Receiving yearly eye exams.  Lipids were back up last visit but she was noncompliant with statin medication. She has been compliant since then. No myalgias. No cardiac history. Denies any recent chest pains.  Hypertension meds reviewed. No side effects. No dizziness. Blood pressures around [id number] systolic by home readings   Review of Systems  Constitutional: Negative for fatigue.  Eyes: Negative for visual disturbance.  Respiratory: Negative for cough, chest tightness, shortness of breath and wheezing.   Cardiovascular: Negative for chest pain, palpitations and leg swelling.  Neurological: Negative for dizziness, seizures, syncope, weakness, light-headedness and headaches.       Objective:   Physical Exam  Constitutional: She is oriented to person, place, and time. She appears well-developed and well-nourished.  HENT:  Mouth/Throat: Oropharynx is clear and moist.  Neck: Neck supple.  Cardiovascular: Normal rate, regular rhythm and normal heart sounds.   Pulmonary/Chest: Effort normal and breath sounds normal. No respiratory distress. She has no wheezes. She has no rales.  Musculoskeletal: She exhibits no edema.       Feet reveal no skin lesions. Good distal foot pulses. Good capillary refill. No calluses. Normal sensation with monofilament testing   Lymphadenopathy:    She has no cervical adenopathy.  Neurological: She is alert and oriented to person, place, and time.          Assessment & Plan:  #1 type 2 diabetes. Reassess A1c. Continue current  medications #2 hypertension. Stable by home readings. Continue current medications. Check basic metabolic panel #3 hyperlipidemia. Check lipid and hepatic panel. Patient to receive flu vaccine through her employer

## 2011-09-05 ENCOUNTER — Other Ambulatory Visit: Payer: Self-pay | Admitting: Family Medicine

## 2011-09-05 MED ORDER — LOVASTATIN 20 MG PO TABS: ORAL_TABLET | ORAL | Status: DC

## 2011-09-05 NOTE — Progress Notes (Signed)
Quick Note:  Pt informed on home VM ______ 

## 2011-10-09 ENCOUNTER — Other Ambulatory Visit: Payer: Self-pay | Admitting: Family Medicine

## 2011-10-15 ENCOUNTER — Other Ambulatory Visit: Payer: Self-pay | Admitting: Family Medicine

## 2011-10-16 ENCOUNTER — Other Ambulatory Visit: Payer: Self-pay | Admitting: Family Medicine

## 2011-10-16 MED ORDER — FELODIPINE ER 5 MG PO TB24: 5.0000 mg | ORAL_TABLET | Freq: Every day | ORAL | Status: DC

## 2011-11-02 ENCOUNTER — Telehealth: Payer: Self-pay

## 2011-11-02 NOTE — Telephone Encounter (Signed)
2pm Thursday, tried to call pt back, again VM has not been set up

## 2011-11-02 NOTE — Telephone Encounter (Signed)
Triage VM; Pt states she is sick and vomiting. Pt would like an rx sent to pharmacy.  Called pt to get more information. Pt's vm had not been set up.  Will attempt to call at a later time.

## 2011-11-03 NOTE — Telephone Encounter (Signed)
I spoke with, she is feeling some better today.  She has stopped vomiting and now just feels awful.  Denies fever, pt did get the flu shot.  We reviewed what eat, stay with clear liquids until she is able to keep foods in.  Pt voiced her understanding

## 2011-11-13 ENCOUNTER — Telehealth: Payer: Self-pay | Admitting: Family Medicine

## 2011-11-13 NOTE — Telephone Encounter (Signed)
Lm with pt husband

## 2011-11-13 NOTE — Telephone Encounter (Signed)
Pt stated her chole was high in oct 2012. Should she repeat chole test?

## 2011-11-13 NOTE — Telephone Encounter (Signed)
Yes.  Needs follow up.  HAve her schedule office follow up within one month and labs then.

## 2011-11-15 NOTE — Telephone Encounter (Signed)
Pt is sch for 12-05-11 815am

## 2011-11-22 ENCOUNTER — Other Ambulatory Visit: Payer: Self-pay | Admitting: Family Medicine

## 2011-12-05 ENCOUNTER — Encounter: Payer: Self-pay | Admitting: Family Medicine

## 2011-12-05 ENCOUNTER — Ambulatory Visit (INDEPENDENT_AMBULATORY_CARE_PROVIDER_SITE_OTHER): Payer: Medicare Other | Admitting: Family Medicine

## 2011-12-05 DIAGNOSIS — I10 Essential (primary) hypertension: Secondary | ICD-10-CM

## 2011-12-05 DIAGNOSIS — E785 Hyperlipidemia, unspecified: Secondary | ICD-10-CM

## 2011-12-05 DIAGNOSIS — E119 Type 2 diabetes mellitus without complications: Secondary | ICD-10-CM

## 2011-12-05 LAB — HEPATIC FUNCTION PANEL
ALT: 20 U/L (ref 0–35)
AST: 21 U/L (ref 0–37)
Albumin: 3.6 g/dL (ref 3.5–5.2)
Alkaline Phosphatase: 80 U/L (ref 39–117)
Bilirubin, Direct: 0 mg/dL (ref 0.0–0.3)
Total Bilirubin: 0.2 mg/dL — ABNORMAL LOW (ref 0.3–1.2)
Total Protein: 6.9 g/dL (ref 6.0–8.3)

## 2011-12-05 LAB — LDL CHOLESTEROL, DIRECT: Direct LDL: 110.4 mg/dL

## 2011-12-05 LAB — HEMOGLOBIN A1C: Hgb A1c MFr Bld: 7 % — ABNORMAL HIGH (ref 4.6–6.5)

## 2011-12-05 LAB — LIPID PANEL
Cholesterol: 200 mg/dL (ref 0–200)
HDL: 37.2 mg/dL — ABNORMAL LOW (ref >39.00)
Total CHOL/HDL Ratio: 5
Triglycerides: 325 mg/dL — ABNORMAL HIGH (ref 0.0–149.0)
VLDL: 65 mg/dL — ABNORMAL HIGH (ref 0.0–40.0)

## 2011-12-05 MED ORDER — FELODIPINE ER 5 MG PO TB24: 5.0000 mg | ORAL_TABLET | Freq: Every day | ORAL | Status: DC

## 2011-12-05 NOTE — Progress Notes (Signed)
  Subjective:    Patient ID: Brenda Patterson, female    DOB: Aug 07, 1941, 71 y.o.   MRN: [id number]  HPI  Medical followup. Patient has history of hypertension, type 2 diabetes, and hyperlipidemia. Blood pressures have been well controlled. No orthostasis. Needs refills of Plendil.  Type 2 diabetes. Recent A1c 7.3%. Recent fasting blood sugar stable. Has lost one pound since last visit.  No hypoglycemia.  Still inconsistent exercise.  Hyperlipidemia. Recent LDL not at goal with 138. We increased her lovastatin 40 mg daily. She's had previous intolerance to Lipitor. No recent myalgias. No chest pain.  Past Medical History  Diagnosis Date  . Allergy   . Diabetes mellitus   . Hypertension   . Hyperlipidemia   . Arthritis   . Migraines    Past Surgical History  Procedure Date  . Tubal ligation   . Cholecystectomy     reports that she has never smoked. She does not have any smokeless tobacco history on file. She reports that she does not drink alcohol or use illicit drugs. family history includes Arthritis in an unspecified family member; Diabetes in an unspecified family member; Hyperlipidemia in an unspecified family member; Hypertension in an unspecified family member; and Stroke in an unspecified family member. Allergies  Allergen Reactions  . Penicillins Rash      Review of Systems  Constitutional: Negative for fatigue.  Eyes: Negative for visual disturbance.  Respiratory: Negative for cough, chest tightness, shortness of breath and wheezing.   Cardiovascular: Negative for chest pain, palpitations and leg swelling.  Neurological: Negative for dizziness, seizures, syncope, weakness, light-headedness and headaches.       Objective:   Physical Exam  Constitutional: She appears well-developed and well-nourished.  Cardiovascular: Normal rate and regular rhythm.   Pulmonary/Chest: Effort normal and breath sounds normal. No respiratory distress. She has no wheezes. She has no  rales.  Musculoskeletal:       Feet reveal no skin lesions. Good distal foot pulses. Good capillary refill. No calluses. Normal sensation with monofilament testing           Assessment & Plan:  #1 type 2 diabetes. History of marginal control. Recheck A1c. Continue weight loss efforts #2 hypertension stable by repeat reading. Refill Plendil for one year. Continue current medications #3 hyperlipidemia. Recently not to goal. Recent titration of low dose statin. Recheck lipid and hepatic panel. If not to goal consider change to Crestor

## 2011-12-14 ENCOUNTER — Telehealth: Payer: Self-pay | Admitting: Family Medicine

## 2011-12-14 NOTE — Telephone Encounter (Signed)
Pt informed labs were mailed yesterday

## 2011-12-14 NOTE — Telephone Encounter (Signed)
Pt would like blood work results °

## 2011-12-20 ENCOUNTER — Ambulatory Visit (INDEPENDENT_AMBULATORY_CARE_PROVIDER_SITE_OTHER): Payer: Medicare Other | Admitting: Family Medicine

## 2011-12-20 ENCOUNTER — Encounter: Payer: Self-pay | Admitting: Family Medicine

## 2011-12-20 VITALS — BP 162/82 | Temp 98.0°F | Wt 227.0 lb

## 2011-12-20 DIAGNOSIS — M25562 Pain in left knee: Secondary | ICD-10-CM

## 2011-12-20 DIAGNOSIS — M25569 Pain in unspecified knee: Secondary | ICD-10-CM

## 2011-12-20 MED ORDER — TRAMADOL HCL 50 MG PO TABS: ORAL_TABLET | ORAL | Status: DC

## 2011-12-20 NOTE — Progress Notes (Signed)
  Subjective:    Patient ID: Brenda Patterson, female    DOB: 1941-02-25, 71 y.o.   MRN: [id number]  HPI  Acute visit. Left knee pain. Onset last weekend. No specific injury that started after getting in and out of tall truck several times. Pain is poorly localized. She has known osteoarthritis. Arthroscopic surgery left knee 25 years ago by Dr. Theda Sers. She has appointment to see orthopedist one week from today.  No locking or giving way. No effusion. No ecchymosis. No erythema or warmth. Pain with flexion and extension. Started meloxicam couple days ago with minimal improvement and significant night pain.  Past Medical History  Diagnosis Date  . Allergy   . Diabetes mellitus   . Hypertension   . Hyperlipidemia   . Arthritis   . Migraines    Past Surgical History  Procedure Date  . Tubal ligation   . Cholecystectomy     reports that she has never smoked. She does not have any smokeless tobacco history on file. She reports that she does not drink alcohol or use illicit drugs. family history includes Arthritis in an unspecified family member; Diabetes in an unspecified family member; Hyperlipidemia in an unspecified family member; Hypertension in an unspecified family member; and Stroke in an unspecified family member. Allergies  Allergen Reactions  . Penicillins Rash      Review of Systems  Constitutional: Negative for fever and chills.  Hematological: Negative for adenopathy.       Objective:   Physical Exam  Constitutional: She appears well-developed and well-nourished.  Cardiovascular: Normal rate and regular rhythm.   Pulmonary/Chest: Effort normal and breath sounds normal. No respiratory distress. She has no rales.  Musculoskeletal: She exhibits no edema.       Left knee reveals crepitus with flexion and extension. No effusion. No warmth or erythema. No ecchymosis. Generalized tenderness medial and lateral joint line. Ligament testing is normal            Assessment & Plan:  Acute left knee pain in patient with known osteoarthritis. Referral back to orthopedist. Continue meloxicam 15 mg daily. She'll try icing. Also prescription for tramadol 50 mg one or 2 every 6 hours when necessary pain

## 2011-12-20 NOTE — Patient Instructions (Signed)
Consider icing as needed

## 2012-01-10 ENCOUNTER — Other Ambulatory Visit: Payer: Self-pay | Admitting: Family Medicine

## 2012-01-10 DIAGNOSIS — Z1231 Encounter for screening mammogram for malignant neoplasm of breast: Secondary | ICD-10-CM

## 2012-01-25 ENCOUNTER — Other Ambulatory Visit: Payer: Self-pay | Admitting: Family Medicine

## 2012-02-09 ENCOUNTER — Emergency Department (HOSPITAL_COMMUNITY)
Admission: EM | Admit: 2012-02-09 | Discharge: 2012-02-09 | Disposition: A | Discharge status: Home / Self Care | Payer: Medicare Other | Attending: Emergency Medicine | Admitting: Emergency Medicine

## 2012-02-09 ENCOUNTER — Encounter (HOSPITAL_COMMUNITY): Payer: Self-pay | Admitting: Emergency Medicine

## 2012-02-09 DIAGNOSIS — K529 Noninfective gastroenteritis and colitis, unspecified: Secondary | ICD-10-CM

## 2012-02-09 DIAGNOSIS — R109 Unspecified abdominal pain: Secondary | ICD-10-CM | POA: Insufficient documentation

## 2012-02-09 DIAGNOSIS — Z7982 Long term (current) use of aspirin: Secondary | ICD-10-CM | POA: Insufficient documentation

## 2012-02-09 DIAGNOSIS — R10817 Generalized abdominal tenderness: Secondary | ICD-10-CM | POA: Insufficient documentation

## 2012-02-09 DIAGNOSIS — K5289 Other specified noninfective gastroenteritis and colitis: Secondary | ICD-10-CM | POA: Insufficient documentation

## 2012-02-09 DIAGNOSIS — R1110 Vomiting, unspecified: Secondary | ICD-10-CM | POA: Insufficient documentation

## 2012-02-09 DIAGNOSIS — E785 Hyperlipidemia, unspecified: Secondary | ICD-10-CM | POA: Insufficient documentation

## 2012-02-09 DIAGNOSIS — E119 Type 2 diabetes mellitus without complications: Secondary | ICD-10-CM | POA: Insufficient documentation

## 2012-02-09 DIAGNOSIS — R197 Diarrhea, unspecified: Secondary | ICD-10-CM | POA: Insufficient documentation

## 2012-02-09 DIAGNOSIS — Z79899 Other long term (current) drug therapy: Secondary | ICD-10-CM | POA: Insufficient documentation

## 2012-02-09 DIAGNOSIS — I10 Essential (primary) hypertension: Secondary | ICD-10-CM | POA: Insufficient documentation

## 2012-02-09 DIAGNOSIS — M129 Arthropathy, unspecified: Secondary | ICD-10-CM | POA: Insufficient documentation

## 2012-02-09 DIAGNOSIS — R112 Nausea with vomiting, unspecified: Secondary | ICD-10-CM | POA: Insufficient documentation

## 2012-02-09 LAB — COMPREHENSIVE METABOLIC PANEL
ALT: 21 U/L (ref 0–35)
AST: 28 U/L (ref 0–37)
Albumin: 3.5 g/dL (ref 3.5–5.2)
Alkaline Phosphatase: 67 U/L (ref 39–117)
BUN: 27 mg/dL — ABNORMAL HIGH (ref 6–23)
CO2: 23 mEq/L (ref 19–32)
Calcium: 9.3 mg/dL (ref 8.4–10.5)
Chloride: 101 mEq/L (ref 96–112)
Creatinine, Ser: 1.13 mg/dL — ABNORMAL HIGH (ref 0.50–1.10)
GFR calc Af Amer: 56 mL/min — ABNORMAL LOW (ref >90)
GFR calc non Af Amer: 48 mL/min — ABNORMAL LOW (ref >90)
Glucose, Bld: 191 mg/dL — ABNORMAL HIGH (ref 70–99)
Potassium: 4 mEq/L (ref 3.5–5.1)
Sodium: 135 mEq/L (ref 135–145)
Total Bilirubin: 0.4 mg/dL (ref 0.3–1.2)
Total Protein: 7.5 g/dL (ref 6.0–8.3)

## 2012-02-09 LAB — URINALYSIS, ROUTINE W REFLEX MICROSCOPIC
Bilirubin Urine: NEGATIVE
Glucose, UA: NEGATIVE mg/dL
Ketones, ur: NEGATIVE mg/dL
Leukocytes, UA: NEGATIVE
Nitrite: NEGATIVE
Protein, ur: 100 mg/dL — AB
Specific Gravity, Urine: >1.03 — ABNORMAL HIGH (ref 1.005–1.030)
Urobilinogen, UA: 0.2 mg/dL (ref 0.0–1.0)
pH: 5 (ref 5.0–8.0)

## 2012-02-09 LAB — DIFFERENTIAL
Basophils Absolute: 0 10*3/uL (ref 0.0–0.1)
Basophils Relative: 0 % (ref 0–1)
Eosinophils Absolute: 0.1 10*3/uL (ref 0.0–0.7)
Eosinophils Relative: 1 % (ref 0–5)
Lymphocytes Relative: 22 % (ref 12–46)
Lymphs Abs: 1.4 10*3/uL (ref 0.7–4.0)
Monocytes Absolute: 0.6 10*3/uL (ref 0.1–1.0)
Monocytes Relative: 9 % (ref 3–12)
Neutro Abs: 4.4 10*3/uL (ref 1.7–7.7)
Neutrophils Relative %: 68 % (ref 43–77)

## 2012-02-09 LAB — CBC
HCT: 40.6 % (ref 36.0–46.0)
Hemoglobin: 13.4 g/dL (ref 12.0–15.0)
MCH: 27.9 pg (ref 26.0–34.0)
MCHC: 33 g/dL (ref 30.0–36.0)
MCV: 84.4 fL (ref 78.0–100.0)
Platelets: 174 10*3/uL (ref 150–400)
RBC: 4.81 MIL/uL (ref 3.87–5.11)
RDW: 13.9 % (ref 11.5–15.5)
WBC: 6.5 10*3/uL (ref 4.0–10.5)

## 2012-02-09 LAB — URINE MICROSCOPIC-ADD ON

## 2012-02-09 LAB — LIPASE, BLOOD: Lipase: 36 U/L (ref 11–59)

## 2012-02-09 MED ORDER — SODIUM CHLORIDE 0.9 % IV BOLUS (SEPSIS)
1000.0000 mL | Freq: Once | INTRAVENOUS | Status: AC
  Administered 2012-02-09: 1000 mL via INTRAVENOUS

## 2012-02-09 MED ORDER — ONDANSETRON HCL 4 MG/2ML IJ SOLN
4.0000 mg | Freq: Once | INTRAMUSCULAR | Status: AC
  Administered 2012-02-09: 4 mg via INTRAVENOUS
  Filled 2012-02-09: qty 2

## 2012-02-09 MED ORDER — ONDANSETRON 4 MG PO TBDP: 4.0000 mg | ORAL_TABLET | Freq: Three times a day (TID) | ORAL | Status: AC | PRN

## 2012-02-09 MED ORDER — HYDROMORPHONE HCL PF 1 MG/ML IJ SOLN
0.5000 mg | Freq: Once | INTRAMUSCULAR | Status: AC
  Administered 2012-02-09: 0.5 mg via INTRAVENOUS
  Filled 2012-02-09: qty 1

## 2012-02-09 MED ORDER — GI COCKTAIL ~~LOC~~
30.0000 mL | Freq: Once | ORAL | Status: AC
  Administered 2012-02-09: 30 mL via ORAL
  Filled 2012-02-09: qty 30

## 2012-02-09 NOTE — Discharge Instructions (Signed)
It is very important that you follow up with your primary care physician today to ensure appropriate ongoing care.  Your evaluation tonight has resulted in a diagnosis of acute gastroenteritis.  Do to the complexity of her medical history, it is very important that you followup with your physician and discuss additional measures you may take to speed resolution of your symptoms.  If you develop any new or concerning changes in your condition, please return to the emergency department immediately.

## 2012-02-09 NOTE — ED Provider Notes (Signed)
History     CSN: [id number]  Arrival date & time 02/09/12  0134   First MD Initiated Contact with Patient 02/09/12 0215      Chief Complaint  Patient presents with  . Abdominal Pain  . Emesis     HPI The patient presents with 3 days of abdominal pain, nausea, vomiting, diarrhea.  She notes that her symptoms began gradually.  Since onset symptoms have been persistent in spite of ibuprofen and other OTC medication.  Patient denies chest pain or dyspnea.  She does endorse fevers and chills.  No clear exacerbating factor.  The patient doesn't complain of anorexia.  Notably, the patient has multiple family members who have "the same" constellation of symptoms. Past Medical History  Diagnosis Date  . Allergy   . Diabetes mellitus   . Hypertension   . Hyperlipidemia   . Arthritis   . Migraines     Past Surgical History  Procedure Date  . Tubal ligation   . Cholecystectomy     Family History  Problem Relation Age of Onset  . Arthritis      fhx  . Hyperlipidemia      fhx  . Hypertension      fhx  . Diabetes      fhx  . Stroke      fhx    History  Substance Use Topics  . Smoking status: Never Smoker   . Smokeless tobacco: Not on file  . Alcohol Use: No    OB History    Grav Para Term Preterm Abortions TAB SAB Ect Mult Living                  Review of Systems  Constitutional:       HPI  HENT:       HPI otherwise negative  Eyes: Negative.   Respiratory:       HPI, otherwise negative  Cardiovascular:       HPI, otherwise nmegative  Gastrointestinal: Positive for nausea, vomiting, abdominal pain and diarrhea. Negative for blood in stool and abdominal distention.  Genitourinary:       HPI, otherwise negative  Musculoskeletal:       HPI, otherwise negative  Skin: Negative.   Neurological: Negative for syncope.    Allergies  Penicillins  Home Medications   Current Outpatient Rx  Name Route Sig Dispense Refill  . ASPIRIN EC 81 MG PO TBEC Oral Take  81 mg by mouth daily.      Marland Kitchen CALCIUM 600 PO Oral Take 600-900 mg by mouth daily. Take one tablet by mouth every morning and take one and one-half tablet by mouth at bedtime     . CARVEDILOL 6.25 MG PO TABS  TAKE ONE TABLET BY MOUTH TWICE DAILY 180 tablet 3  . VITAMIN D 1000 UNITS PO TABS Oral Take 1,000 Units by mouth daily.      Marland Kitchen VITAMIN B-12 1000 MCG SL SUBL Sublingual Place 1,000 mcg under the tongue daily.      Marland Kitchen FELODIPINE ER 5 MG PO TB24 Oral Take 1 tablet (5 mg total) by mouth daily. 90 tablet 3  . OMEGA-3 FATTY ACIDS 1000 MG PO CAPS Oral Take 1 g by mouth daily.     Marland Kitchen GLIPIZIDE ER 2.5 MG PO TB24  TAKE ONE TABLET BY MOUTH EVERY DAY 90 tablet 3  . HYDROCHLOROTHIAZIDE 25 MG PO TABS  1/2 tab daily     . LISINOPRIL 40 MG PO TABS  TAKE  ONE TABLET BY MOUTH EVERY DAY 90 tablet 2  . LOVASTATIN 20 MG PO TABS  Take 2 tabs at bedtime 60 tablet 3  . MELOXICAM 15 MG PO TABS Oral Take 1 tablet (15 mg total) by mouth daily as needed. 30 tablet 3  . METFORMIN HCL 500 MG PO TABS  TAKE TWO TABLETS BY MOUTH IN THE MORNING AND ONE IN THE EVENING 270 tablet 3  . ONE-DAILY MULTI VITAMINS PO TABS Oral Take 1 tablet by mouth daily.      . SERTRALINE HCL 50 MG PO TABS Oral Take by mouth daily.     . SERTRALINE HCL 50 MG PO TABS Oral Take 50 mg by mouth daily.     . TRAMADOL HCL 50 MG PO TABS  1-2 po q 6 hours prn pain 60 tablet 0    BP 132/62  Pulse 88  Temp(Src) 98.1 F (36.7 C) (Oral)  Resp 20  Ht 5\' 5"  (1.651 m)  Wt 224 lb (101.606 kg)  BMI 37.28 kg/m2  SpO2 96%  Physical Exam  Nursing note and vitals reviewed. Constitutional: She is oriented to person, place, and time. She appears well-developed and well-nourished. No distress.  HENT:  Head: Normocephalic and atraumatic.  Eyes: Conjunctivae and EOM are normal.  Cardiovascular: Normal rate and regular rhythm.   Pulmonary/Chest: Effort normal and breath sounds normal. No stridor. No respiratory distress.  Abdominal: Soft. Normal appearance.  She exhibits no distension. There is no hepatosplenomegaly. There is generalized tenderness. There is no rigidity, no rebound, no guarding and no CVA tenderness.  Musculoskeletal: She exhibits no edema.  Neurological: She is alert and oriented to person, place, and time. No cranial nerve deficit.  Skin: Skin is warm and dry.  Psychiatric: She has a normal mood and affect.    ED Course  Procedures (including critical care time)   Labs Reviewed  COMPREHENSIVE METABOLIC PANEL  CBC  DIFFERENTIAL  LIPASE, BLOOD  URINALYSIS, ROUTINE W REFLEX MICROSCOPIC   No results found.   No diagnosis found.    MDM  This elderly female with multiple medical problems, including diabetes and hypertension now presents with several days of abdominal pain, nausea, vomiting, diarrhea.  On exam the patient is in no distress with essentially unremarkable vital signs.  Patient does have minimal tenderness diffusely in her abdomen.  She's not peritoneal and there are bowel sounds, decreasing suspicion for acute obstruction.  The patient's acknowledgment that multiple family members have the same symptoms as her is suggestive of viral gastroenteritis.  Absence of leukocytosis on the patient's labs his reassuring.  The patient's lab abnormalities are consistent with long standing hypertension and diabetes.  The patient and her daughters were made aware of all findings and the need for continued evaluation and management as an outpatient.  Following ED interventions the patient noted that she felt substantially better.  Given this change, the patient's aforementioned evaluation, she was discharged in stable condition.        Carmin Muskrat, MD 02/09/12 [id number]

## 2012-02-09 NOTE — ED Notes (Signed)
Patient c/o abdominal pain with emesis x 2 days.  Patient states her abdomen burns.

## 2012-02-15 ENCOUNTER — Ambulatory Visit (INDEPENDENT_AMBULATORY_CARE_PROVIDER_SITE_OTHER): Payer: Medicare Other | Admitting: Family Medicine

## 2012-02-15 ENCOUNTER — Encounter: Payer: Self-pay | Admitting: Family Medicine

## 2012-02-15 VITALS — BP 118/80 | Temp 98.1°F | Wt 225.0 lb

## 2012-02-15 DIAGNOSIS — R319 Hematuria, unspecified: Secondary | ICD-10-CM

## 2012-02-15 DIAGNOSIS — A09 Infectious gastroenteritis and colitis, unspecified: Secondary | ICD-10-CM

## 2012-02-15 DIAGNOSIS — A084 Viral intestinal infection, unspecified: Secondary | ICD-10-CM

## 2012-02-15 LAB — POCT URINALYSIS DIPSTICK
Bilirubin, UA: NEGATIVE
Blood, UA: NEGATIVE
Glucose, UA: NEGATIVE
Ketones, UA: NEGATIVE
Leukocytes, UA: NEGATIVE
Nitrite, UA: NEGATIVE
Spec Grav, UA: <=1.005
Urobilinogen, UA: 0.2
pH, UA: 7

## 2012-02-15 NOTE — Patient Instructions (Signed)
Viral Gastroenteritis Viral gastroenteritis is also known as stomach flu. This condition affects the stomach and intestinal tract. It can cause sudden diarrhea and vomiting. The illness typically lasts 3 to 8 days. Most people develop an immune response that eventually gets rid of the virus. While this natural response develops, the virus can make you quite ill. CAUSES  Many different viruses can cause gastroenteritis, such as rotavirus or noroviruses. You can catch one of these viruses by consuming contaminated food or water. You may also catch a virus by sharing utensils or other personal items with an infected person or by touching a contaminated surface. SYMPTOMS  The most common symptoms are diarrhea and vomiting. These problems can cause a severe loss of body fluids (dehydration) and a body salt (electrolyte) imbalance. Other symptoms may include:  Fever.   Headache.   Fatigue.   Abdominal pain.  DIAGNOSIS  Your caregiver can usually diagnose viral gastroenteritis based on your symptoms and a physical exam. A stool sample may also be taken to test for the presence of viruses or other infections. TREATMENT  This illness typically goes away on its own. Treatments are aimed at rehydration. The most serious cases of viral gastroenteritis involve vomiting so severely that you are not able to keep fluids down. In these cases, fluids must be given through an intravenous line (IV). HOME CARE INSTRUCTIONS   Drink enough fluids to keep your urine clear or pale yellow. Drink small amounts of fluids frequently and increase the amounts as tolerated.   Ask your caregiver for specific rehydration instructions.   Avoid:   Foods high in sugar.   Alcohol.   Carbonated drinks.   Tobacco.   Juice.   Caffeine drinks.   Extremely hot or cold fluids.   Fatty, greasy foods.   Too much intake of anything at one time.   Dairy products until 24 to 48 hours after diarrhea stops.   You may  consume probiotics. Probiotics are active cultures of beneficial bacteria. They may lessen the amount and number of diarrheal stools in adults. Probiotics can be found in yogurt with active cultures and in supplements.   Wash your hands well to avoid spreading the virus.   Only take over-the-counter or prescription medicines for pain, discomfort, or fever as directed by your caregiver. Do not give aspirin to children. Antidiarrheal medicines are not recommended.   Ask your caregiver if you should continue to take your regular prescribed and over-the-counter medicines.   Keep all follow-up appointments as directed by your caregiver.  SEEK IMMEDIATE MEDICAL CARE IF:   You are unable to keep fluids down.   You do not urinate at least once every 6 to 8 hours.   You develop shortness of breath.   You notice blood in your stool or vomit. This may look like coffee grounds.   You have abdominal pain that increases or is concentrated in one small area (localized).   You have persistent vomiting or diarrhea.   You have a fever.   The patient is a child younger than 3 months, and he or she has a fever.   The patient is a child older than 3 months, and he or she has a fever and persistent symptoms.   The patient is a child older than 3 months, and he or she has a fever and symptoms suddenly get worse.   The patient is a baby, and he or she has no tears when crying.  MAKE SURE YOU:     Understand these instructions.   Will watch your condition.   Will get help right away if you are not doing well or get worse.  Document Released: 10/23/2005 Document Revised: 10/12/2011 Document Reviewed: 08/09/2011 [facility] Patient Information 2012 [place].Foods Rich in Potassium Food / Potassium (mg)  Apricots, dried,  cup / 378 mg   Apricots, raw, 1 cup halves / 401 mg   Avocado,  / 487 mg   Banana, 1 large / 487 mg   Beef, lean, round, 3 oz / 202 mg   Cantaloupe, 1 cup cubes / 427  mg   Dates, medjool, 5 whole / 835 mg   Ham, cured, 3 oz / 212 mg   Lentils, dried,  cup / 458 mg   Lima beans, frozen,  cup / 258 mg   Orange, 1 large / 333 mg   Orange juice, 1 cup / 443 mg   Peaches, dried,  cup / 398 mg   Peas, split, cooked,  cup / 355 mg   Potato, boiled, 1 medium / 515 mg   Prunes, dried, uncooked,  cup / 318 mg   Raisins,  cup / 309 mg   Salmon, pink, raw, 3 oz / 275 mg   Sardines, canned , 3 oz / 338 mg   Tomato, raw, 1 medium / 292 mg   Tomato juice, 6 oz / 417 mg   [place], 3 oz / 349 mg  Document Released: 10/23/2005 Document Revised: 07/05/2011 Document Reviewed: 03/08/2009 [facility] Patient Information 2012 [place], [place].

## 2012-02-15 NOTE — Progress Notes (Signed)
  Subjective:    Patient ID: Brenda Patterson, female    DOB: 30-Dec-1940, 71 y.o.   MRN: [id number]  HPI  ER followup. Presented there 6 days ago with some vomiting and diarrhea. Lab work was unremarkable with exception of blood in urine (dipstick). She had evidence for some mild dehydration. She was given Zofran and had no further nausea or vomiting for several days now. She still has loose stools about 5 per day up to today but this is improving. No bloody stools. Denies fever or chills. Several family members had similar illness. Her white count was normal. Her appetite is slowly improving.  Patient denies any dysuria. No gross hematuria. Nonsmoker.   Review of Systems  Constitutional: Positive for appetite change. Negative for fever and chills.  Respiratory: Negative for cough.   Cardiovascular: Negative for chest pain.  Gastrointestinal: Negative for abdominal pain.  Genitourinary: Negative for dysuria and frequency.       Objective:   Physical Exam  Constitutional: She appears well-developed and well-nourished.  HENT:  Mouth/Throat: Oropharynx is clear and moist.  Neck: Neck supple.  Cardiovascular: Normal rate and regular rhythm.   Pulmonary/Chest: Effort normal and breath sounds normal. No respiratory distress. She has no wheezes. She has no rales.  Abdominal: Soft. There is no tenderness.  Musculoskeletal: She exhibits no edema.          Assessment & Plan:  #1 resolving viral gastroenteritis. Continue potassium replacement. Plenty of fluids. #2 hematuria noted recently on urine dipstick in emergency department. No gross hematuria. Repeat urinalysis today. She has no symptoms of UTI No hematuria on repeat.

## 2012-02-16 ENCOUNTER — Ambulatory Visit: Payer: Medicare Other | Admitting: Family Medicine

## 2012-02-29 ENCOUNTER — Ambulatory Visit
Admission: RE | Admit: 2012-02-29 | Discharge: 2012-02-29 | Disposition: A | Discharge status: Home / Self Care | Payer: Medicare Other | Source: Ambulatory Visit | Attending: Family Medicine | Admitting: Family Medicine

## 2012-02-29 DIAGNOSIS — Z1231 Encounter for screening mammogram for malignant neoplasm of breast: Secondary | ICD-10-CM

## 2012-03-01 ENCOUNTER — Ambulatory Visit (INDEPENDENT_AMBULATORY_CARE_PROVIDER_SITE_OTHER): Payer: Medicare Other | Admitting: Family Medicine

## 2012-03-01 ENCOUNTER — Encounter: Payer: Self-pay | Admitting: Family Medicine

## 2012-03-01 VITALS — BP 150/80 | Temp 98.2°F | Wt 226.0 lb

## 2012-03-01 DIAGNOSIS — F329 Major depressive disorder, single episode, unspecified: Secondary | ICD-10-CM

## 2012-03-01 DIAGNOSIS — E119 Type 2 diabetes mellitus without complications: Secondary | ICD-10-CM

## 2012-03-01 DIAGNOSIS — I10 Essential (primary) hypertension: Secondary | ICD-10-CM

## 2012-03-01 DIAGNOSIS — E785 Hyperlipidemia, unspecified: Secondary | ICD-10-CM

## 2012-03-01 DIAGNOSIS — F3289 Other specified depressive episodes: Secondary | ICD-10-CM

## 2012-03-01 LAB — HM DIABETES FOOT EXAM: HM Diabetic Foot Exam: NORMAL

## 2012-03-01 NOTE — Progress Notes (Signed)
  Subjective:    Patient ID: Brenda Patterson, female    DOB: 12/22/1940, 71 y.o.   MRN: [id number]  HPI  Medical followup. Patient had recent gastroenteritis and symptoms have fully resolved. Blood sugars by home readings stable. Last A1c 7.0%. No symptoms of hyperglycemia or hypoglycemia. She has eye exam scheduled. No neuropathy symptoms. She remains on metformin and glipizide.  Hypertension treated with 4 drug regimen. Blood pressures consistently around 130/80 or less by home monitoring. No headaches or dizziness. No chest pains. No dyspnea.  History of depression treated with sertraline. Depression is stable.  No anxiety symptoms.  Past Medical History  Diagnosis Date  . Allergy   . Diabetes mellitus   . Hypertension   . Hyperlipidemia   . Arthritis   . Migraines    Past Surgical History  Procedure Date  . Tubal ligation   . Cholecystectomy     reports that she has never smoked. She does not have any smokeless tobacco history on file. She reports that she does not drink alcohol or use illicit drugs. family history includes Arthritis in an unspecified family member; Diabetes in an unspecified family member; Hyperlipidemia in an unspecified family member; Hypertension in an unspecified family member; and Stroke in an unspecified family member. Allergies  Allergen Reactions  . Penicillins Rash      Review of Systems  Constitutional: Negative for appetite change, fatigue and unexpected weight change.  Eyes: Negative for visual disturbance.  Respiratory: Negative for cough, chest tightness, shortness of breath and wheezing.   Cardiovascular: Negative for chest pain, palpitations and leg swelling.  Genitourinary: Negative for dysuria.  Neurological: Negative for dizziness, seizures, syncope, weakness, light-headedness and headaches.  Psychiatric/Behavioral: Negative for dysphoric mood. The patient is not nervous/anxious.        Objective:   Physical Exam  Constitutional:  She is oriented to person, place, and time. She appears well-developed and well-nourished. No distress.  HENT:  Mouth/Throat: Oropharynx is clear and moist.  Neck: Neck supple. No thyromegaly present.  Cardiovascular: Normal rate and regular rhythm.   Pulmonary/Chest: Effort normal and breath sounds normal. No respiratory distress. She has no wheezes. She has no rales.  Musculoskeletal: She exhibits no edema.  Neurological: She is alert and oriented to person, place, and time.          Assessment & Plan:  #1 type 2 diabetes. Stable. Recheck A1c and follow up in 3 months. Continue yearly eye exams  #2 hyperlipidemia. Recent LDL 110. Work on weight loss. Recheck lipids at followup. We recently increased her lovastatin dosage #3 hypertension. White coat syndrome. Control by home readings. Continue current medications  #4 depression stable continue sertraline 50 mg daily

## 2012-03-21 ENCOUNTER — Other Ambulatory Visit: Payer: Self-pay | Admitting: Family Medicine

## 2012-03-27 ENCOUNTER — Other Ambulatory Visit: Payer: Self-pay

## 2012-03-27 MED ORDER — LOVASTATIN 20 MG PO TABS: ORAL_TABLET | ORAL | Status: DC

## 2012-04-18 ENCOUNTER — Telehealth: Payer: Self-pay | Admitting: Family Medicine

## 2012-04-18 NOTE — Telephone Encounter (Signed)
Discontinue meloxicam. Start Celebrex 200 mg once daily

## 2012-04-18 NOTE — Telephone Encounter (Signed)
Pt called and said that meloxicam (MOBIC) 15 MG tablet is not working as well.  Pt req script for Celebrex. [facility] in [place].

## 2012-04-19 MED ORDER — CELECOXIB 200 MG PO CAPS: 200.0000 mg | ORAL_CAPSULE | Freq: Every day | ORAL | Status: AC

## 2012-04-19 NOTE — Telephone Encounter (Signed)
Pt informed, Celebrex sent

## 2012-04-26 ENCOUNTER — Other Ambulatory Visit: Payer: Self-pay | Admitting: Family Medicine

## 2012-05-23 ENCOUNTER — Ambulatory Visit: Payer: Self-pay | Admitting: Family Medicine

## 2012-05-27 ENCOUNTER — Other Ambulatory Visit: Payer: Self-pay | Admitting: Family Medicine

## 2012-05-30 ENCOUNTER — Encounter: Payer: Self-pay | Admitting: Family Medicine

## 2012-05-30 ENCOUNTER — Ambulatory Visit (INDEPENDENT_AMBULATORY_CARE_PROVIDER_SITE_OTHER): Payer: Medicare Other | Admitting: Family Medicine

## 2012-05-30 VITALS — BP 144/78 | Temp 98.4°F | Wt 224.0 lb

## 2012-05-30 DIAGNOSIS — E119 Type 2 diabetes mellitus without complications: Secondary | ICD-10-CM

## 2012-05-30 DIAGNOSIS — E785 Hyperlipidemia, unspecified: Secondary | ICD-10-CM

## 2012-05-30 DIAGNOSIS — I10 Essential (primary) hypertension: Secondary | ICD-10-CM

## 2012-05-30 LAB — HEPATIC FUNCTION PANEL
ALT: 19 U/L (ref 0–35)
AST: 20 U/L (ref 0–37)
Albumin: 3.7 g/dL (ref 3.5–5.2)
Alkaline Phosphatase: 87 U/L (ref 39–117)
Bilirubin, Direct: 0 mg/dL (ref 0.0–0.3)
Total Bilirubin: 0.6 mg/dL (ref 0.3–1.2)
Total Protein: 7 g/dL (ref 6.0–8.3)

## 2012-05-30 LAB — LIPID PANEL
Cholesterol: 179 mg/dL (ref 0–200)
HDL: 39.5 mg/dL (ref >39.00)
Total CHOL/HDL Ratio: 5
Triglycerides: 259 mg/dL — ABNORMAL HIGH (ref 0.0–149.0)
VLDL: 51.8 mg/dL — ABNORMAL HIGH (ref 0.0–40.0)

## 2012-05-30 LAB — HEMOGLOBIN A1C: Hgb A1c MFr Bld: 7.2 % — ABNORMAL HIGH (ref 4.6–6.5)

## 2012-05-30 LAB — LDL CHOLESTEROL, DIRECT: Direct LDL: 103.1 mg/dL

## 2012-05-30 NOTE — Progress Notes (Signed)
  Subjective:    Patient ID: Brenda Patterson, female    DOB: 1941-02-08, 71 y.o.   MRN: [id number]  HPI  Medical followup. Type 2 diabetes, obesity, hypertension, hyperlipidemia and depression. Depression stable on Zoloft. Last A1c 7.0%. Blood sugars been stable. No symptoms of hyperglycemia. Continues with yearly eye exams.  Hypertension well controlled by home readings. No orthostasis. Consistently around 120/70 by home readings. Suspected white coat syndrome.  Compliant with all medications.  Past Medical History  Diagnosis Date  . Allergy   . Diabetes mellitus   . Hypertension   . Hyperlipidemia   . Arthritis   . Migraines    Past Surgical History  Procedure Date  . Tubal ligation   . Cholecystectomy     reports that she has never smoked. She does not have any smokeless tobacco history on file. She reports that she does not drink alcohol or use illicit drugs. family history includes Arthritis in an unspecified family member; Diabetes in an unspecified family member; Hyperlipidemia in an unspecified family member; Hypertension in an unspecified family member; and Stroke in an unspecified family member. Allergies  Allergen Reactions  . Penicillins Rash     Review of Systems  Constitutional: Negative for fatigue and unexpected weight change.  Eyes: Negative for visual disturbance.  Respiratory: Negative for cough, chest tightness, shortness of breath and wheezing.   Cardiovascular: Negative for chest pain, palpitations and leg swelling.  Neurological: Negative for dizziness, seizures, syncope, weakness, light-headedness and headaches.       Objective:   Physical Exam  Constitutional: She appears well-developed and well-nourished.  Neck: Neck supple. No thyromegaly present.  Cardiovascular: Normal rate and regular rhythm.  Exam reveals no gallop.   Pulmonary/Chest: Effort normal and breath sounds normal. No respiratory distress. She has no wheezes. She has no rales.    Musculoskeletal: She exhibits no edema.  Lymphadenopathy:    She has no cervical adenopathy.          Assessment & Plan:  #1 hypertension. Probable white coat syndrome. Continues to have good control by home readings. Continue efforts to lose weight. Continue current medications #2 type 2 diabetes. Recheck A1c. History of good control.  #3 hyperlipidemia. Past LDL 110. Repeat lipid and hepatic panel

## 2012-06-03 NOTE — Progress Notes (Signed)
Quick Note:  Attempt to call hm# - Vm- LMTCB if questions - labs stable with slight improvement to lipids , tried cell# - "mailbox not set up" ______

## 2012-08-23 ENCOUNTER — Other Ambulatory Visit: Payer: Self-pay | Admitting: Family Medicine

## 2012-10-12 ENCOUNTER — Other Ambulatory Visit: Payer: Self-pay | Admitting: Family Medicine

## 2012-10-14 ENCOUNTER — Other Ambulatory Visit: Payer: Self-pay | Admitting: *Deleted

## 2012-10-14 MED ORDER — LOVASTATIN 20 MG PO TABS: ORAL_TABLET | ORAL | Status: DC

## 2012-10-19 ENCOUNTER — Other Ambulatory Visit: Payer: Self-pay | Admitting: Family Medicine

## 2012-10-25 ENCOUNTER — Telehealth: Payer: Self-pay | Admitting: Family Medicine

## 2012-10-25 NOTE — Telephone Encounter (Signed)
Patient Information:  Caller Name: Brenda Patterson  Phone: [phone]  Patient: Brenda Patterson, Brenda Patterson  Gender: Female  DOB: Nov 19, 1940  Age: 71 Years  PCP: Carolann Littler [facility])  Office Follow Up:  Does the office need to follow up with this patient?: No  Instructions For The Office: N/A  RN Note:  No new medications/antibiotics; has mild discharge.  Denies burning on urination, fever, or emergent symptoms per vulvar symptoms protocol; advised appt in office.  No appts available in system within dispositioned time frame; patient is working 10/26/12 and cannot come to [place] office in AM.  Per standing orders, Rx for Diflucan 150mg  po x 1, #1, NR called in to [facility] [phone].  krs/can  Symptoms  Reason For Call & Symptoms: Patient calling about vaginal irritation/itching.  Reviewed Health History In EMR: Yes  Reviewed Medications In EMR: Yes  Reviewed Allergies In EMR: Yes  Reviewed Surgeries / Procedures: Yes  Date of Onset of Symptoms: 10/23/2012  Guideline(s) Used:  Vulvar Symptoms  Disposition Per Guideline:   See Today or Tomorrow in Office  Reason For Disposition Reached:   Moderate-Severe itching (i.e., interferes with school, work, or sleep)  Advice Given:  N/A

## 2012-11-18 ENCOUNTER — Encounter: Payer: Self-pay | Admitting: Family

## 2012-11-18 ENCOUNTER — Ambulatory Visit (INDEPENDENT_AMBULATORY_CARE_PROVIDER_SITE_OTHER): Payer: Medicare Other | Admitting: Family

## 2012-11-18 VITALS — BP 192/90 | HR 76 | Temp 97.9°F | Wt 218.0 lb

## 2012-11-18 DIAGNOSIS — R110 Nausea: Secondary | ICD-10-CM

## 2012-11-18 DIAGNOSIS — R0982 Postnasal drip: Secondary | ICD-10-CM

## 2012-11-18 DIAGNOSIS — I10 Essential (primary) hypertension: Secondary | ICD-10-CM

## 2012-11-18 DIAGNOSIS — J019 Acute sinusitis, unspecified: Secondary | ICD-10-CM

## 2012-11-18 MED ORDER — METHYLPREDNISOLONE 4 MG PO KIT: PACK | ORAL | Status: DC

## 2012-11-18 MED ORDER — AZITHROMYCIN 250 MG PO TABS: ORAL_TABLET | ORAL | Status: DC

## 2012-11-18 NOTE — Progress Notes (Signed)
Subjective:    Patient ID: Brenda Patterson, female    DOB: Jun 11, 1941, 72 y.o.   MRN: [id number]  HPI 72 year old white female, nonsmoker, patient of Dr. Elease Hashimoto is in today with complaints of intense sinus pressure, cough, fever, congestion, postnasal drip since 10/25/2012. She's been taken over-the-counter medication with no relief. Patient reports an increase in nausea due to the production of phlegm and has not been taking her blood pressure medication because she has not been eating well. Reports taking less than 50% of her doses over the last 3 weeks.   Review of Systems  Constitutional: Positive for appetite change and fatigue. Negative for fever.  HENT: Positive for congestion, sore throat, sneezing and postnasal drip.   Respiratory: Positive for cough.   Cardiovascular: Negative.   Skin: Negative.   Hematological: Negative.   Psychiatric/Behavioral: Negative.    Past Medical History  Diagnosis Date  . Allergy   . Diabetes mellitus   . Hypertension   . Hyperlipidemia   . Arthritis   . Migraines     History   Social History  . Marital Status: Married    Spouse Name: N/A    Number of Children: N/A  . Years of Education: N/A   Occupational History  . Not on file.   Social History Main Topics  . Smoking status: Never Smoker   . Smokeless tobacco: Not on file  . Alcohol Use: No  . Drug Use: No  . Sexually Active: Yes   Other Topics Concern  . Not on file   Social History Narrative  . No narrative on file    Past Surgical History  Procedure Date  . Tubal ligation   . Cholecystectomy     Family History  Problem Relation Age of Onset  . Arthritis      fhx  . Hyperlipidemia      fhx  . Hypertension      fhx  . Diabetes      fhx  . Stroke      fhx    Allergies  Allergen Reactions  . Penicillins Rash    Current Outpatient Prescriptions on File Prior to Visit  Medication Sig Dispense Refill  . aspirin EC 81 MG tablet Take 81 mg by mouth  daily.        . Calcium Carbonate (CALCIUM 600 PO) Take 600-900 mg by mouth daily. Take one tablet by mouth every morning and take one and one-half tablet by mouth at bedtime       . carvedilol (COREG) 6.25 MG tablet TAKE ONE TABLET BY MOUTH TWICE DAILY  180 tablet  2  . cholecalciferol (VITAMIN D) 1000 UNITS tablet Take 1,000 Units by mouth daily.        . Cyanocobalamin (VITAMIN B-12) 1000 MCG SUBL Place 1,000 mcg under the tongue daily.        . felodipine (PLENDIL) 5 MG 24 hr tablet Take 1 tablet (5 mg total) by mouth daily.  90 tablet  3  . fish oil-omega-3 fatty acids 1000 MG capsule Take 1 g by mouth daily.       Marland Kitchen glipiZIDE (GLUCOTROL XL) 2.5 MG 24 hr tablet TAKE ONE TABLET BY MOUTH EVERY DAY  90 tablet  3  . hydrochlorothiazide (HYDRODIURIL) 25 MG tablet 1/2 tab daily       . hydrochlorothiazide (HYDRODIURIL) 25 MG tablet TAKE ONE TABLET BY MOUTH EVERY DAY  90 tablet  0  . lisinopril (PRINIVIL,ZESTRIL) 40 MG tablet TAKE ONE  TABLET BY MOUTH EVERY DAY  90 tablet  0  . lovastatin (MEVACOR) 20 MG tablet Take 2 tabs at bedtime  180 tablet  3  . meloxicam (MOBIC) 15 MG tablet TAKE ONE TABLET BY MOUTH EVERY DAY AS NEEDED  30 tablet  6  . metFORMIN (GLUCOPHAGE) 500 MG tablet TAKE TWO TABLETS BY MOUTH IN THE MORNING AND ONE IN THE EVENING  270 tablet  3  . Multiple Vitamin (MULTIVITAMIN) tablet Take 1 tablet by mouth daily.        . sertraline (ZOLOFT) 50 MG tablet TAKE ONE TABLET BY MOUTH EVERY DAY  90 tablet  3    BP 192/90  Pulse 76  Temp 97.9 F (36.6 C) (Oral)  Wt 218 lb (98.884 kg)  SpO2 97%chart    Objective:   Physical Exam  Constitutional: She is oriented to person, place, and time. She appears well-developed and well-nourished.  HENT:  Right Ear: External ear normal.  Left Ear: External ear normal.  Mouth/Throat: Oropharynx is clear and moist.       Sinus tenderness to palpation of the maxillary and frontal sinuses bilaterally. Boggy nasal turbinates bilaterally.  Neck:  Normal range of motion. Neck supple.  Cardiovascular: Normal rate, regular rhythm and normal heart sounds.   Pulmonary/Chest: Effort normal and breath sounds normal.  Abdominal: Soft. Bowel sounds are normal.  Neurological: She is alert and oriented to person, place, and time.  Skin: Skin is warm and dry.  Psychiatric: She has a normal mood and affect.          Assessment & Plan:  Assessment: Nausea, acute sinusitis, cough, postnasal drip  Plan: Advised her to take her blood pressure medication as prescribed daily. She will go home and take it today. Z-Pak as directed. Medrol Dosepak as directed. Rest. Drink plenty of fluids. Patient call the office if symptoms worsen or persist. Recheck a schedule, and as needed.

## 2012-11-18 NOTE — Patient Instructions (Signed)

## 2012-11-21 ENCOUNTER — Encounter (HOSPITAL_COMMUNITY): Payer: Self-pay | Admitting: *Deleted

## 2012-11-21 ENCOUNTER — Emergency Department (HOSPITAL_COMMUNITY): Payer: Medicare Other

## 2012-11-21 ENCOUNTER — Emergency Department (HOSPITAL_COMMUNITY)
Admission: EM | Admit: 2012-11-21 | Discharge: 2012-11-21 | Disposition: A | Discharge status: Home / Self Care | Payer: Medicare Other | Attending: Emergency Medicine | Admitting: Emergency Medicine

## 2012-11-21 DIAGNOSIS — R630 Anorexia: Secondary | ICD-10-CM | POA: Insufficient documentation

## 2012-11-21 DIAGNOSIS — R42 Dizziness and giddiness: Secondary | ICD-10-CM | POA: Insufficient documentation

## 2012-11-21 DIAGNOSIS — Z8679 Personal history of other diseases of the circulatory system: Secondary | ICD-10-CM | POA: Insufficient documentation

## 2012-11-21 DIAGNOSIS — R531 Weakness: Secondary | ICD-10-CM

## 2012-11-21 DIAGNOSIS — Z79899 Other long term (current) drug therapy: Secondary | ICD-10-CM | POA: Insufficient documentation

## 2012-11-21 DIAGNOSIS — E785 Hyperlipidemia, unspecified: Secondary | ICD-10-CM | POA: Insufficient documentation

## 2012-11-21 DIAGNOSIS — Z8739 Personal history of other diseases of the musculoskeletal system and connective tissue: Secondary | ICD-10-CM | POA: Insufficient documentation

## 2012-11-21 DIAGNOSIS — R5381 Other malaise: Secondary | ICD-10-CM | POA: Insufficient documentation

## 2012-11-21 DIAGNOSIS — E119 Type 2 diabetes mellitus without complications: Secondary | ICD-10-CM | POA: Insufficient documentation

## 2012-11-21 DIAGNOSIS — R509 Fever, unspecified: Secondary | ICD-10-CM | POA: Insufficient documentation

## 2012-11-21 DIAGNOSIS — J3489 Other specified disorders of nose and nasal sinuses: Secondary | ICD-10-CM | POA: Insufficient documentation

## 2012-11-21 DIAGNOSIS — Z7982 Long term (current) use of aspirin: Secondary | ICD-10-CM | POA: Insufficient documentation

## 2012-11-21 DIAGNOSIS — I10 Essential (primary) hypertension: Secondary | ICD-10-CM | POA: Insufficient documentation

## 2012-11-21 LAB — CBC WITH DIFFERENTIAL/PLATELET
Basophils Absolute: 0 10*3/uL (ref 0.0–0.1)
Basophils Relative: 0 % (ref 0–1)
Eosinophils Absolute: 0.2 10*3/uL (ref 0.0–0.7)
Eosinophils Relative: 3 % (ref 0–5)
HCT: 35.7 % — ABNORMAL LOW (ref 36.0–46.0)
Hemoglobin: 12.3 g/dL (ref 12.0–15.0)
Lymphocytes Relative: 48 % — ABNORMAL HIGH (ref 12–46)
Lymphs Abs: 2.5 10*3/uL (ref 0.7–4.0)
MCH: 28.5 pg (ref 26.0–34.0)
MCHC: 34.5 g/dL (ref 30.0–36.0)
MCV: 82.6 fL (ref 78.0–100.0)
Monocytes Absolute: 0.4 10*3/uL (ref 0.1–1.0)
Monocytes Relative: 7 % (ref 3–12)
Neutro Abs: 2.2 10*3/uL (ref 1.7–7.7)
Neutrophils Relative %: 42 % — ABNORMAL LOW (ref 43–77)
Platelets: 228 10*3/uL (ref 150–400)
RBC: 4.32 MIL/uL (ref 3.87–5.11)
RDW: 14 % (ref 11.5–15.5)
WBC: 5.2 10*3/uL (ref 4.0–10.5)

## 2012-11-21 LAB — URINALYSIS, ROUTINE W REFLEX MICROSCOPIC
Bilirubin Urine: NEGATIVE
Glucose, UA: NEGATIVE mg/dL
Hgb urine dipstick: NEGATIVE
Ketones, ur: NEGATIVE mg/dL
Leukocytes, UA: NEGATIVE
Nitrite: NEGATIVE
Protein, ur: 100 mg/dL — AB
Specific Gravity, Urine: >1.03 — ABNORMAL HIGH (ref 1.005–1.030)
Urobilinogen, UA: 0.2 mg/dL (ref 0.0–1.0)
pH: 5.5 (ref 5.0–8.0)

## 2012-11-21 LAB — COMPREHENSIVE METABOLIC PANEL
ALT: 16 U/L (ref 0–35)
AST: 16 U/L (ref 0–37)
Albumin: 3.4 g/dL — ABNORMAL LOW (ref 3.5–5.2)
Alkaline Phosphatase: 90 U/L (ref 39–117)
BUN: 13 mg/dL (ref 6–23)
CO2: 27 mEq/L (ref 19–32)
Calcium: 9.4 mg/dL (ref 8.4–10.5)
Chloride: 99 mEq/L (ref 96–112)
Creatinine, Ser: 0.88 mg/dL (ref 0.50–1.10)
GFR calc Af Amer: 75 mL/min — ABNORMAL LOW (ref >90)
GFR calc non Af Amer: 65 mL/min — ABNORMAL LOW (ref >90)
Glucose, Bld: 204 mg/dL — ABNORMAL HIGH (ref 70–99)
Potassium: 3.9 mEq/L (ref 3.5–5.1)
Sodium: 137 mEq/L (ref 135–145)
Total Bilirubin: 0.3 mg/dL (ref 0.3–1.2)
Total Protein: 7.1 g/dL (ref 6.0–8.3)

## 2012-11-21 LAB — INFLUENZA PANEL BY PCR (TYPE A & B)
H1N1 flu by pcr: NOT DETECTED
Influenza A By PCR: NEGATIVE
Influenza B By PCR: NEGATIVE

## 2012-11-21 LAB — URINE MICROSCOPIC-ADD ON

## 2012-11-21 LAB — RAPID STREP SCREEN (MED CTR MEBANE ONLY): Streptococcus, Group A Screen (Direct): NEGATIVE

## 2012-11-21 LAB — TROPONIN I: Troponin I: <0.3 ng/mL (ref <0.30)

## 2012-11-21 MED ORDER — LISINOPRIL 10 MG PO TABS
40.0000 mg | ORAL_TABLET | Freq: Every day | ORAL | Status: DC
  Administered 2012-11-21: 40 mg via ORAL
  Filled 2012-11-21: qty 4

## 2012-11-21 MED ORDER — ONDANSETRON HCL 4 MG PO TABS: 4.0000 mg | ORAL_TABLET | Freq: Four times a day (QID) | ORAL | Status: DC

## 2012-11-21 MED ORDER — FELODIPINE ER 5 MG PO TB24
5.0000 mg | ORAL_TABLET | Freq: Every day | ORAL | Status: DC
  Administered 2012-11-21: 5 mg via ORAL
  Filled 2012-11-21 (×2): qty 1

## 2012-11-21 MED ORDER — CARVEDILOL 3.125 MG PO TABS
6.2500 mg | ORAL_TABLET | Freq: Two times a day (BID) | ORAL | Status: DC
  Administered 2012-11-21: 6.25 mg via ORAL
  Filled 2012-11-21 (×2): qty 1

## 2012-11-21 MED ORDER — SODIUM CHLORIDE 0.9 % IV BOLUS (SEPSIS)
1000.0000 mL | Freq: Once | INTRAVENOUS | Status: AC
  Administered 2012-11-21: 1000 mL via INTRAVENOUS

## 2012-11-21 MED ORDER — ONDANSETRON HCL 4 MG/2ML IJ SOLN
4.0000 mg | Freq: Once | INTRAMUSCULAR | Status: AC
  Administered 2012-11-21: 4 mg via INTRAVENOUS
  Filled 2012-11-21: qty 2

## 2012-11-21 NOTE — ED Notes (Signed)
C/o nausea, anorexia onset 5 days ago; states was seen by PCP 5 days ago, placed on Z-pak for sinusitis; reports onset of s/s after starting z-pak.

## 2012-11-21 NOTE — ED Notes (Signed)
Sprite provided for pt., also requesting crackers, will try fluids first

## 2012-11-21 NOTE — ED Notes (Signed)
Pt ambulated around nurse desk in the dept, sats 97-98% and HR 92-95, pt ambulated without difficultly and without assistance

## 2012-11-21 NOTE — ED Provider Notes (Signed)
History  This chart was scribed for Brenda Essex, MD by Roe Coombs, ED Scribe. The patient was seen in room APA10/APA10. Patient's care was started at 1005.  CSN: [id number]  Arrival date & time 11/21/12  [id number]   First MD Initiated Contact with Patient 11/21/12 1005      Chief Complaint  Patient presents with  . Nausea  . Nasal Congestion  . Weakness    The history is provided by the patient. No language interpreter was used.    HPI Comments: Brenda SOTAK is a 72 y.o. Patterson who presents to the Emergency Department complaining of intermittent nausea and generalized weakness onset 3 weeks ago. There is associated dizziness and headache, but she says that she is not currently experiencing either symptom. Patient also states that she is intolerant of both solids and fluids. Patient denies any recent fevers, abdominal pain, diarrhea, vomiting or chest pain. Patient states that she hasn't taken any of her medicines today. Patient saw [name] 3 days ago because she was having nasal congestion and was placed on Z-Pak for sinusitis. She has two days left on the prescription. She says that she has developed nausea in the past while taking azithromycin, but her current symptoms began before starting z-pak. Patient has a medical history of diabetes and hypertension. She takes metformin, lisinopril and carvedilol. Patient works at [employer].    Past Medical History  Diagnosis Date  . Allergy   . Diabetes mellitus   . Hypertension   . Hyperlipidemia   . Arthritis   . Migraines     Past Surgical History  Procedure Date  . Tubal ligation   . Cholecystectomy     Family History  Problem Relation Age of Onset  . Arthritis      fhx  . Hyperlipidemia      fhx  . Hypertension      fhx  . Diabetes      fhx  . Stroke      fhx    History  Substance Use Topics  . Smoking status: Never Smoker   . Smokeless tobacco: Not on file  . Alcohol Use: No    OB  History    Grav Para Term Preterm Abortions TAB SAB Ect Mult Living                  Review of Systems A complete 10 system review of systems was obtained and all systems are negative except as noted in the HPI and PMH.   Allergies  Penicillins  Home Medications   Current Outpatient Rx  Name  Route  Sig  Dispense  Refill  . ASPIRIN EC 81 MG PO TBEC   Oral   Take 81 mg by mouth daily.           . AZITHROMYCIN 250 MG PO TABS      As directed   6 tablet   0   . CALCIUM 600 PO   Oral   Take 600-900 mg by mouth daily. Take one tablet by mouth every morning and take one and one-half tablet by mouth at bedtime          . CARVEDILOL 6.25 MG PO TABS      TAKE ONE TABLET BY MOUTH TWICE DAILY   180 tablet   2   . VITAMIN D 1000 UNITS PO TABS   Oral   Take 1,000 Units by mouth daily.           Marland Kitchen  VITAMIN B-12 1000 MCG SL SUBL   Sublingual   Place 1,000 mcg under the tongue daily.           Marland Kitchen FELODIPINE ER 5 MG PO TB24   Oral   Take 1 tablet (5 mg total) by mouth daily.   90 tablet   3   . OMEGA-3 FATTY ACIDS 1000 MG PO CAPS   Oral   Take 1 g by mouth daily.          Marland Kitchen GLIPIZIDE ER 2.5 MG PO TB24      TAKE ONE TABLET BY MOUTH EVERY DAY   90 tablet   3   . HYDROCHLOROTHIAZIDE 25 MG PO TABS      1/2 tab daily          . HYDROCHLOROTHIAZIDE 25 MG PO TABS      TAKE ONE TABLET BY MOUTH EVERY DAY   90 tablet   0     Pt needs return OV before any more refills, please ...   . LISINOPRIL 40 MG PO TABS      TAKE ONE TABLET BY MOUTH EVERY DAY   90 tablet   0     Pt needs return OV before any more refills, please ...   . LOVASTATIN 20 MG PO TABS      Take 2 tabs at bedtime   180 tablet   3   . MELOXICAM 15 MG PO TABS      TAKE ONE TABLET BY MOUTH EVERY DAY AS NEEDED   30 tablet   6   . METFORMIN HCL 500 MG PO TABS      TAKE TWO TABLETS BY MOUTH IN THE MORNING AND ONE IN THE EVENING   270 tablet   3   . METHYLPREDNISOLONE 4 MG PO  KIT      follow package directions   21 tablet   0   . ONE-DAILY MULTI VITAMINS PO TABS   Oral   Take 1 tablet by mouth daily.           . SERTRALINE HCL 50 MG PO TABS      TAKE ONE TABLET BY MOUTH EVERY DAY   90 tablet   3     Triage Vitals: BP 220/91  Pulse 75  Temp 97.8 F (36.6 C) (Oral)  Resp 20  Ht 5\' 5"  (1.651 m)  Wt 218 lb (98.884 kg)  BMI 36.28 kg/m2  SpO2 99%  Physical Exam  Constitutional: She is oriented to person, place, and time. She appears well-developed and well-nourished.  HENT:  Head: Normocephalic and atraumatic.  Mouth/Throat: Oropharynx is clear and moist.       Upper airway congestion. Boggy nasal mucosa. No meningismus.   Eyes: Conjunctivae normal and EOM are normal. Pupils are equal, round, and reactive to light.  Neck: Neck supple.  Cardiovascular: Normal rate, regular rhythm and normal heart sounds.   No murmur heard. Pulmonary/Chest: Effort normal and breath sounds normal. No respiratory distress. She has no decreased breath sounds. She has no wheezes. She has no rales.  Abdominal: Soft. Bowel sounds are normal. She exhibits no distension. There is no tenderness.  Musculoskeletal: Normal range of motion. She exhibits no edema.  Neurological: She is alert and oriented to person, place, and time.  Skin: Skin is warm. No rash noted.  Psychiatric: She has a normal mood and affect. Her behavior is normal.    ED Course  Procedures (including critical care time) DIAGNOSTIC  STUDIES: Oxygen Saturation is 99% on room air, normal by my interpretation.    COORDINATION OF CARE: 10:12 AM- Patient informed of current plan for treatment and evaluation and agrees with plan at this time.   12:05 PM- Updated patient on lab and imaging results. States that nausea is improved. Have instructed patient to finish z-pak. Will order PO challenge and walk patient.     Labs Reviewed  CBC WITH DIFFERENTIAL - Abnormal; Notable for the following:    HCT 35.7  (*)     Neutrophils Relative 42 (*)     Lymphocytes Relative 48 (*)     All other components within normal limits  COMPREHENSIVE METABOLIC PANEL - Abnormal; Notable for the following:    Glucose, Bld 204 (*)     Albumin 3.4 (*)     GFR calc non Af Amer 65 (*)     GFR calc Af Amer 75 (*)     All other components within normal limits  URINALYSIS, ROUTINE W REFLEX MICROSCOPIC - Abnormal; Notable for the following:    Specific Gravity, Urine >1.030 (*)     Protein, ur 100 (*)     All other components within normal limits  URINE MICROSCOPIC-ADD ON - Abnormal; Notable for the following:    Squamous Epithelial / LPF FEW (*)     All other components within normal limits  TROPONIN I  INFLUENZA PANEL BY PCR  RAPID STREP SCREEN    Dg Chest 2 View  11/21/2012  *RADIOLOGY REPORT*  Clinical Data: Weakness.  CHEST - 2 VIEW  Comparison: Two-view chest x-ray 06/14/2011.  Findings: Heart size is normal.  The lungs are clear.  The visualized soft tissues and bony thorax are unremarkable.  IMPRESSION: Negative two-view chest.   Original Report Authenticated By: San Morelle, M.D.      No diagnosis found.    MDM  Patient has not felt well since December 26 with nausea and anorexia. Saw PCP 3 days ago and placed on Zithromax for sinusitis. Endorses nausea and anorexia. Denies any abdominal pain, chest pain, fever. No vomiting. Upper airway congestion. Lungs clear. EKG nonischemic. Q waves new since 2008.  Troponin negative No distress. BP improved after given home meds. No chest pain or SOB. History not consistent with ACS or PE.   No vomiting in ED. Tolerating PO, nontoxic appearing, distress.  Date: 11/21/2012  Rate: 77  Rhythm: normal sinus rhythm  QRS Axis: normal  Intervals: normal  ST/T Wave abnormalities: normal  Conduction Disutrbances:none  Narrative Interpretation: new septal Q waves compared to 2008.  Old EKG Reviewed: changes noted      I personally performed the  services described in this documentation, which was scribed in my presence. The recorded information has been reviewed and is accurate.       Brenda Essex, MD 11/21/12 [phone]

## 2012-11-22 ENCOUNTER — Encounter: Payer: Self-pay | Admitting: Family Medicine

## 2012-11-22 ENCOUNTER — Ambulatory Visit (INDEPENDENT_AMBULATORY_CARE_PROVIDER_SITE_OTHER): Payer: Medicare Other | Admitting: Family Medicine

## 2012-11-22 VITALS — BP 150/80 | Temp 97.9°F | Wt 218.0 lb

## 2012-11-22 DIAGNOSIS — R112 Nausea with vomiting, unspecified: Secondary | ICD-10-CM

## 2012-11-22 DIAGNOSIS — R9431 Abnormal electrocardiogram [ECG] [EKG]: Secondary | ICD-10-CM

## 2012-11-22 MED ORDER — LEVOFLOXACIN 500 MG PO TABS: 500.0000 mg | ORAL_TABLET | Freq: Every day | ORAL | Status: DC

## 2012-11-22 NOTE — Progress Notes (Signed)
  Subjective:    Patient ID: Brenda Patterson, female    DOB: Jul 02, 1941, 72 y.o.   MRN: [id number]  HPI Emergency room followup.  Patient presented here first part of the week with possible acute sinusitis. Treated with Zithromax. After about second dose she developed nausea and eventually some vomiting. Went to emergency department with some generalized weakness and nausea but no chest pains, cough, abdominal pain, or any dyspnea. She was given IV fluids. Chest x-ray unremarkable. Troponin negative. EKG showed question of new Q waves anteroseptal leads compared to prior tracing 2008. Patient has no known cardiac history.  Chronic problems include type 2 diabetes, obesity, hyperlipidemia, history of depression, hypertension. Patient instructed to hold metformin until followup here. She had total resolution of nausea after stopping Zithromax.  Not monitoring blood sugars much recently. She has followup in just a couple weeks to reassess diabetes  Past Medical History  Diagnosis Date  . Allergy   . Diabetes mellitus   . Hypertension   . Hyperlipidemia   . Arthritis   . Migraines    Past Surgical History  Procedure Date  . Tubal ligation   . Cholecystectomy     reports that she has never smoked. She does not have any smokeless tobacco history on file. She reports that she does not drink alcohol or use illicit drugs. family history includes Arthritis in an unspecified family member; Diabetes in an unspecified family member; Hyperlipidemia in an unspecified family member; Hypertension in an unspecified family member; and Stroke in an unspecified family member. Allergies  Allergen Reactions  . Azithromycin Nausea Only  . Penicillins Rash      Review of Systems  Constitutional: Negative for fever and chills.  Respiratory: Negative for cough and shortness of breath.   Cardiovascular: Negative for chest pain.  Gastrointestinal: Negative for abdominal pain.  Genitourinary: Negative for  dysuria.  Neurological: Negative for dizziness and syncope.  Hematological: Negative for adenopathy.       Objective:   Physical Exam  Constitutional: She appears well-developed and well-nourished. No distress.  HENT:  Mouth/Throat: Oropharynx is clear and moist.  Neck: Neck supple. No thyromegaly present.  Cardiovascular: Normal rate and regular rhythm.   Pulmonary/Chest: Effort normal and breath sounds normal. No respiratory distress. She has no wheezes. She has no rales.  Abdominal: Soft. Bowel sounds are normal. She exhibits no distension and no mass. There is no tenderness. There is no rebound and no guarding.  Musculoskeletal: She exhibits no edema.          Assessment & Plan:  #1 recent nausea and vomiting. Resolved. Very likely related to Zithromax. Avoid macrolide antibiotics in the future. She appears well-hydrated. Creatinine in the ED 0.8. May resume metformin #2 questionable EKG change with reported Q waves anteroseptal leads new since 2008. Cannot locate 2008 EKG.  Will try to locate.  No recent chest pain.  No cardiac hx.  Consider echo to assess for wall motion abnormalities.

## 2012-11-22 NOTE — Patient Instructions (Addendum)
Resume metformin and glipizide as long as drinking fluids well.

## 2012-11-26 ENCOUNTER — Encounter: Payer: Self-pay | Admitting: *Deleted

## 2012-11-26 ENCOUNTER — Telehealth: Payer: Self-pay | Admitting: Family Medicine

## 2012-11-26 NOTE — Telephone Encounter (Signed)
Patient Information:  Caller Name: [place]  Phone: [phone]  Patient: Brenda, Patterson  Gender: Female  DOB: 1941-01-26  Age: 72 Years  PCP: Carolann Littler [facility])  Office Follow Up:  Does the office need to follow up with this patient?: Yes  Instructions For The Office: Please ask if PCP will send work excuse for her dated from 11/25/12 through 11/27/12 without her having to come in.  She relates symptoms have improved.  Note may be faxed to  [facility] @ [phone]. Patient would like a callback either way.  Thank you.   Symptoms  Reason For Call & Symptoms: Patient relates she was seen in office twice last week as well as ED. Symptoms have improved by  her report.  She still feels weak and was unable to go to work today.  She requests note from provider to excuse her from work 1/20, 1/21 and 1/22.  She states she is willing to come in if he will not write a note otherwise.  Reviewed Health History In EMR: Yes  Reviewed Medications In EMR: Yes  Reviewed Allergies In EMR: Yes  Reviewed Surgeries / Procedures: Yes  Date of Onset of Symptoms: Unknown  Guideline(s) Used:  No Protocol Available - Sick Adult  Disposition Per Guideline:   Discuss with PCP and Callback by Nurse Today  Reason For Disposition Reached:   Nursing judgment  Advice Given:  N/A

## 2012-11-26 NOTE — Telephone Encounter (Signed)
Ok to provide note

## 2012-11-26 NOTE — Telephone Encounter (Signed)
Pt informed letter was done and will be faxed as requested.

## 2012-12-02 ENCOUNTER — Ambulatory Visit (HOSPITAL_COMMUNITY): Payer: Medicare Other | Attending: Cardiovascular Disease | Admitting: Radiology

## 2012-12-02 DIAGNOSIS — E669 Obesity, unspecified: Secondary | ICD-10-CM | POA: Insufficient documentation

## 2012-12-02 DIAGNOSIS — E119 Type 2 diabetes mellitus without complications: Secondary | ICD-10-CM | POA: Insufficient documentation

## 2012-12-02 DIAGNOSIS — E785 Hyperlipidemia, unspecified: Secondary | ICD-10-CM | POA: Insufficient documentation

## 2012-12-02 DIAGNOSIS — I10 Essential (primary) hypertension: Secondary | ICD-10-CM | POA: Insufficient documentation

## 2012-12-02 DIAGNOSIS — R9431 Abnormal electrocardiogram [ECG] [EKG]: Secondary | ICD-10-CM | POA: Insufficient documentation

## 2012-12-02 NOTE — Progress Notes (Signed)
Echocardiogram performed.  

## 2012-12-05 ENCOUNTER — Other Ambulatory Visit: Payer: Self-pay | Admitting: Family Medicine

## 2012-12-05 NOTE — Progress Notes (Signed)
Quick Note:  Pt informed, has OV tomorrow ______

## 2012-12-06 ENCOUNTER — Ambulatory Visit (INDEPENDENT_AMBULATORY_CARE_PROVIDER_SITE_OTHER): Payer: Medicare Other | Admitting: Family Medicine

## 2012-12-06 ENCOUNTER — Encounter: Payer: Self-pay | Admitting: Family Medicine

## 2012-12-06 ENCOUNTER — Other Ambulatory Visit: Payer: Self-pay | Admitting: *Deleted

## 2012-12-06 VITALS — BP 160/90 | Temp 97.7°F | Wt 218.0 lb

## 2012-12-06 DIAGNOSIS — I10 Essential (primary) hypertension: Secondary | ICD-10-CM

## 2012-12-06 DIAGNOSIS — E119 Type 2 diabetes mellitus without complications: Secondary | ICD-10-CM

## 2012-12-06 DIAGNOSIS — E785 Hyperlipidemia, unspecified: Secondary | ICD-10-CM

## 2012-12-06 LAB — HEMOGLOBIN A1C: Hgb A1c MFr Bld: 7.3 % — ABNORMAL HIGH (ref 4.6–6.5)

## 2012-12-06 MED ORDER — LISINOPRIL 40 MG PO TABS: 40.0000 mg | ORAL_TABLET | Freq: Every day | ORAL | Status: DC

## 2012-12-06 NOTE — Progress Notes (Signed)
  Subjective:    Patient ID: Brenda Patterson, female    DOB: 1941/02/11, 72 y.o.   MRN: [id number]  HPI Patient seen for medical followup. Recent nausea and vomiting fully resolved. Possibly exacerbated by Zithromax. Diabetes not monitored frequently. No symptoms of hyper or hypoglycemia. Last A1c 7.2%. She gets yearly eye exams. No history of neuropathy  Hypertension treated with multidrug regimen including lisinopril, carvedilol, and Plendil. Patient just started back her HCTZ couple days ago. Not monitoring blood pressure at home. No headaches. No chest pains.  Recent abnormal EKG with question of new Q waves anteroseptal leads. Echocardiogram revealed no significant abnormalities. Normal ejection fraction. No valvular modalities. Mild LVH. No wall motion abnormalities.  Past Medical History  Diagnosis Date  . Allergy   . Diabetes mellitus   . Hypertension   . Hyperlipidemia   . Arthritis   . Migraines    Past Surgical History  Procedure Date  . Tubal ligation   . Cholecystectomy     reports that she has never smoked. She does not have any smokeless tobacco history on file. She reports that she does not drink alcohol or use illicit drugs. family history includes Arthritis in an unspecified family member; Diabetes in an unspecified family member; Hyperlipidemia in an unspecified family member; Hypertension in an unspecified family member; and Stroke in an unspecified family member. Allergies  Allergen Reactions  . Azithromycin Nausea Only  . Penicillins Rash      Review of Systems  Constitutional: Negative for chills, appetite change, fatigue and unexpected weight change.  Eyes: Negative for visual disturbance.  Respiratory: Negative for cough, chest tightness, shortness of breath and wheezing.   Cardiovascular: Negative for chest pain, palpitations and leg swelling.  Neurological: Negative for dizziness, seizures, syncope, weakness, light-headedness and headaches.        Objective:   Physical Exam  Constitutional: She appears well-developed and well-nourished.  HENT:  Right Ear: External ear normal.  Left Ear: External ear normal.  Mouth/Throat: Oropharynx is clear and moist.  Neck: Neck supple. No thyromegaly present.  Cardiovascular: Normal rate and regular rhythm.   Pulmonary/Chest: Effort normal and breath sounds normal. No respiratory distress. She has no wheezes. She has no rales.  Musculoskeletal: She exhibits no edema.  Lymphadenopathy:    She has no cervical adenopathy.  Skin:       Feet reveal no skin lesions. Good distal foot pulses. Good capillary refill. No calluses. Normal sensation with monofilament testing           Assessment & Plan:  #1 type 2 diabetes. History of fair control. Recheck A1c. Continue yearly eye exam #2 hypertension. Slightly elevated today. Monitor home. She just started back HCTZ yesterday. Be In touch if consistently greater than 140/90. Consider titration of Plendil to 10 mg if still elevated #3 hyperlipidemia. Repeat lipids at followup. Continue Mevacor

## 2012-12-06 NOTE — Patient Instructions (Addendum)
Monitor blood pressure and be in touch if > 140/90 consistently.

## 2012-12-06 NOTE — Progress Notes (Signed)
Quick Note:  Pt informed ______ 

## 2012-12-10 ENCOUNTER — Telehealth: Payer: Self-pay | Admitting: Family Medicine

## 2012-12-10 NOTE — Telephone Encounter (Signed)
Patient Information:  Caller Name: Manola  Phone: [phone]  Patient: Brenda Patterson, Brenda Patterson  Gender: Female  DOB: 1941-06-26  Age: 72 Years  PCP: Carolann Littler (Family Practice)  Office Follow Up:  Does the office need to follow up with this patient?: No  Instructions For The Office: N/A  RN Note:  informed Oneisha that another OV would be necessary prior to any rx for an antibiotic  Symptoms  Reason For Call & Symptoms: last seen in the office 12/06/12; completed Levaquin for sinusitis; sinus drainage has restarted, which also causes nausea; wants to know if another antibiotic is needed to clear up her sxs  Reviewed Health History In EMR: Yes  Reviewed Medications In EMR: Yes  Reviewed Allergies In EMR: Yes  Reviewed Surgeries / Procedures: Yes  Date of Onset of Symptoms: 12/07/2012  Guideline(s) Used:  Colds  Disposition Per Guideline:   Home Care  Reason For Disposition Reached:   Colds with no complications  Advice Given:  Reassurance  Colds are very common and may make you feel uncomfortable.  For a Stuffy Nose - Use Nasal Washes:  Introduction: Saline (salt water) nasal irrigation (nasal wash) is an effective and simple home remedy for treating stuffy nose and sinus congestion. The nose can be irrigated by pouring, spraying, or squirting salt water into the nose and then letting it run back out.  Methods: There are several ways to perform nasal irrigation. You can use a saline nasal spray bottle (available over-the-counter), a rubber ear syringe, a medical syringe without the needle, or a Neti Pot.  Humidifier:  If the air in your home is dry, use a cool-mist humidifier  Expected Course:   Fever may last 2-3 days  Nasal discharge 7-14 days  Cough up to 2-3 weeks.  Call Back If:  You become worse

## 2012-12-13 ENCOUNTER — Other Ambulatory Visit: Payer: Self-pay | Admitting: Family Medicine

## 2013-01-03 ENCOUNTER — Other Ambulatory Visit: Payer: Self-pay | Admitting: Family Medicine

## 2013-02-18 ENCOUNTER — Other Ambulatory Visit: Payer: Self-pay | Admitting: Family Medicine

## 2013-03-11 ENCOUNTER — Other Ambulatory Visit: Payer: Self-pay

## 2013-03-11 DIAGNOSIS — Z1231 Encounter for screening mammogram for malignant neoplasm of breast: Secondary | ICD-10-CM

## 2013-03-20 ENCOUNTER — Ambulatory Visit: Payer: Medicare Other | Admitting: Family Medicine

## 2013-04-03 ENCOUNTER — Ambulatory Visit (INDEPENDENT_AMBULATORY_CARE_PROVIDER_SITE_OTHER): Payer: Medicare Other | Admitting: Family Medicine

## 2013-04-03 ENCOUNTER — Encounter: Payer: Self-pay | Admitting: Family Medicine

## 2013-04-03 VITALS — BP 150/90 | Temp 97.8°F | Wt 217.0 lb

## 2013-04-03 DIAGNOSIS — I10 Essential (primary) hypertension: Secondary | ICD-10-CM

## 2013-04-03 DIAGNOSIS — E785 Hyperlipidemia, unspecified: Secondary | ICD-10-CM

## 2013-04-03 DIAGNOSIS — E119 Type 2 diabetes mellitus without complications: Secondary | ICD-10-CM

## 2013-04-03 LAB — HEPATIC FUNCTION PANEL
ALT: 24 U/L (ref 0–35)
AST: 24 U/L (ref 0–37)
Albumin: 3.6 g/dL (ref 3.5–5.2)
Alkaline Phosphatase: 70 U/L (ref 39–117)
Bilirubin, Direct: 0.1 mg/dL (ref 0.0–0.3)
Total Bilirubin: 0.7 mg/dL (ref 0.3–1.2)
Total Protein: 6.8 g/dL (ref 6.0–8.3)

## 2013-04-03 LAB — LIPID PANEL
Cholesterol: 184 mg/dL (ref 0–200)
HDL: 37.1 mg/dL — ABNORMAL LOW (ref >39.00)
Total CHOL/HDL Ratio: 5
Triglycerides: 272 mg/dL — ABNORMAL HIGH (ref 0.0–149.0)
VLDL: 54.4 mg/dL — ABNORMAL HIGH (ref 0.0–40.0)

## 2013-04-03 LAB — BASIC METABOLIC PANEL
BUN: 23 mg/dL (ref 6–23)
CO2: 26 mEq/L (ref 19–32)
Calcium: 8.8 mg/dL (ref 8.4–10.5)
Chloride: 104 mEq/L (ref 96–112)
Creatinine, Ser: 1.2 mg/dL (ref 0.4–1.2)
GFR: 47.05 mL/min — ABNORMAL LOW (ref >60.00)
Glucose, Bld: 120 mg/dL — ABNORMAL HIGH (ref 70–99)
Potassium: 4.1 mEq/L (ref 3.5–5.1)
Sodium: 138 mEq/L (ref 135–145)

## 2013-04-03 LAB — LDL CHOLESTEROL, DIRECT: Direct LDL: 110.1 mg/dL

## 2013-04-03 LAB — HEMOGLOBIN A1C: Hgb A1c MFr Bld: 6.7 % — ABNORMAL HIGH (ref 4.6–6.5)

## 2013-04-03 MED ORDER — HYDROCHLOROTHIAZIDE 25 MG PO TABS: 25.0000 mg | ORAL_TABLET | Freq: Every day | ORAL | Status: DC

## 2013-04-03 NOTE — Progress Notes (Signed)
  Subjective:    Patient ID: Brenda Patterson, female    DOB: 02/17/1941, 72 y.o.   MRN: [id number]  HPI Medical followup. Patient's history of type 2 diabetes, hypertension, hyperlipidemia, and has history of depression Medications reviewed. Compliant with all.  Hypertension which has been very well controlled by home readings. Consistently less than 130/80 by home readings. No dizziness. No chest pains. Mild peripheral edema left lower extremity usually worse late day improves after elevation  Type 2 diabetes. Last A1c 7.3%. She's lost 1 pound since then. She has eye exam set up for next week.  Hyperlipidemia treated with lovastatin. No myalgias.  Past Medical History  Diagnosis Date  . Allergy   . Diabetes mellitus   . Hypertension   . Hyperlipidemia   . Arthritis   . Migraines    Past Surgical History  Procedure Laterality Date  . Tubal ligation    . Cholecystectomy      reports that she has never smoked. She does not have any smokeless tobacco history on file. She reports that she does not drink alcohol or use illicit drugs. family history includes Arthritis in an unspecified family member; Diabetes in an unspecified family member; Hyperlipidemia in an unspecified family member; Hypertension in an unspecified family member; and Stroke in an unspecified family member. Allergies  Allergen Reactions  . Azithromycin Nausea Only  . Penicillins Rash      Review of Systems  Constitutional: Negative for fatigue and unexpected weight change.  Eyes: Negative for visual disturbance.  Respiratory: Negative for cough, chest tightness, shortness of breath and wheezing.   Cardiovascular: Negative for chest pain, palpitations and leg swelling.  Endocrine: Negative for polydipsia and polyuria.  Neurological: Negative for dizziness, seizures, syncope, weakness, light-headedness and headaches.       Objective:   Physical Exam  Constitutional: She is oriented to person, place, and  time. She appears well-developed and well-nourished.  HENT:  Right Ear: External ear normal.  Left Ear: External ear normal.  Neck: Neck supple. No thyromegaly present.  Cardiovascular: Normal rate and regular rhythm.   Pulmonary/Chest: Effort normal and breath sounds normal. No respiratory distress. She has no wheezes. She has no rales.  Musculoskeletal: She exhibits no edema.  Neurological: She is alert and oriented to person, place, and time.  Skin:  Feet reveal no skin lesions. Good distal foot pulses. Good capillary refill. No calluses. Normal sensation with monofilament testing           Assessment & Plan:  #1 type 2 diabetes. History of fair control. Recheck A1c. Continue weight loss efforts #2 hypertension. Probable white coat syndrome. Stable by home readings. Check basic metabolic panel #3 hyperlipidemia. Check lipid and hepatic panel

## 2013-04-04 NOTE — Progress Notes (Signed)
Quick Note:  Pt informed ______ 

## 2013-04-07 ENCOUNTER — Other Ambulatory Visit: Payer: Self-pay | Admitting: Family Medicine

## 2013-04-10 ENCOUNTER — Ambulatory Visit
Admission: RE | Admit: 2013-04-10 | Discharge: 2013-04-10 | Disposition: A | Discharge status: Home / Self Care | Payer: Medicare Other | Source: Ambulatory Visit

## 2013-04-10 DIAGNOSIS — Z1231 Encounter for screening mammogram for malignant neoplasm of breast: Secondary | ICD-10-CM

## 2013-04-11 ENCOUNTER — Other Ambulatory Visit: Payer: Self-pay | Admitting: Family Medicine

## 2013-04-11 DIAGNOSIS — R928 Other abnormal and inconclusive findings on diagnostic imaging of breast: Secondary | ICD-10-CM

## 2013-04-17 LAB — HM DIABETES EYE EXAM

## 2013-04-23 ENCOUNTER — Encounter: Payer: Self-pay | Admitting: Family Medicine

## 2013-04-28 ENCOUNTER — Ambulatory Visit
Admission: RE | Admit: 2013-04-28 | Discharge: 2013-04-28 | Disposition: A | Discharge status: Home / Self Care | Payer: Medicare Other | Source: Ambulatory Visit | Attending: Family Medicine | Admitting: Family Medicine

## 2013-04-28 DIAGNOSIS — R928 Other abnormal and inconclusive findings on diagnostic imaging of breast: Secondary | ICD-10-CM

## 2013-05-01 ENCOUNTER — Other Ambulatory Visit: Payer: Medicare Other

## 2013-06-16 ENCOUNTER — Other Ambulatory Visit: Payer: Self-pay | Admitting: Family Medicine

## 2013-07-11 ENCOUNTER — Other Ambulatory Visit: Payer: Self-pay

## 2013-07-11 MED ORDER — FELODIPINE ER 5 MG PO TB24: ORAL_TABLET | ORAL | Status: DC

## 2013-07-25 ENCOUNTER — Other Ambulatory Visit: Payer: Self-pay

## 2013-07-25 MED ORDER — SERTRALINE HCL 50 MG PO TABS: ORAL_TABLET | ORAL | Status: DC

## 2013-08-22 ENCOUNTER — Ambulatory Visit: Payer: Medicare Other | Admitting: Family Medicine

## 2013-08-25 ENCOUNTER — Other Ambulatory Visit: Payer: Self-pay | Admitting: Family Medicine

## 2013-09-19 ENCOUNTER — Ambulatory Visit: Payer: Medicare Other | Admitting: Family Medicine

## 2013-09-25 ENCOUNTER — Ambulatory Visit (INDEPENDENT_AMBULATORY_CARE_PROVIDER_SITE_OTHER): Payer: Medicare Other | Admitting: Family Medicine

## 2013-09-25 ENCOUNTER — Encounter: Payer: Self-pay | Admitting: Family Medicine

## 2013-09-25 VITALS — BP 144/82 | HR 86 | Temp 97.9°F | Wt 219.0 lb

## 2013-09-25 DIAGNOSIS — E669 Obesity, unspecified: Secondary | ICD-10-CM | POA: Insufficient documentation

## 2013-09-25 DIAGNOSIS — E785 Hyperlipidemia, unspecified: Secondary | ICD-10-CM

## 2013-09-25 DIAGNOSIS — I10 Essential (primary) hypertension: Secondary | ICD-10-CM

## 2013-09-25 DIAGNOSIS — J069 Acute upper respiratory infection, unspecified: Secondary | ICD-10-CM

## 2013-09-25 DIAGNOSIS — E119 Type 2 diabetes mellitus without complications: Secondary | ICD-10-CM

## 2013-09-25 LAB — HEMOGLOBIN A1C: Hgb A1c MFr Bld: 6.7 % — ABNORMAL HIGH (ref 4.6–6.5)

## 2013-09-25 LAB — HM DIABETES FOOT EXAM

## 2013-09-25 NOTE — Progress Notes (Signed)
  Subjective:    Patient ID: Brenda Patterson, female    DOB: 1941/03/22, 72 y.o.   MRN: [id number]  HPI Patient seen for medical followup. Has hx of type 2 diabetes, hypertension, hyperlipidemia, obesity. She is currently dealing with acute issue of URI. Onset about 2-3 days ago. She has cough which is mostly dry. Right earache. Nasal congestion. Symptoms improved compared to 2 days ago. No fever.  Patient had hemoglobin A1c 6.7% when checked 6 months ago. Not recently monitoring blood sugars. Her blood pressures have been stable. No headaches. No chest pains. No dizziness. No recent hypoglycemia. Eye exam is current. No history of retinopathy. Compliant with medications and denies any side effects.  Past Medical History  Diagnosis Date  . Allergy   . Diabetes mellitus   . Hypertension   . Hyperlipidemia   . Arthritis   . Migraines    Past Surgical History  Procedure Laterality Date  . Tubal ligation    . Cholecystectomy      reports that she has never smoked. She does not have any smokeless tobacco history on file. She reports that she does not drink alcohol or use illicit drugs. family history includes Arthritis in an other family member; Diabetes in an other family member; Hyperlipidemia in an other family member; Hypertension in an other family member; Stroke in an other family member. Allergies  Allergen Reactions  . Azithromycin Nausea Only  . Penicillins Rash      Review of Systems  Constitutional: Negative for fatigue and unexpected weight change.  HENT: Positive for congestion.   Eyes: Negative for visual disturbance.  Respiratory: Positive for cough. Negative for chest tightness, shortness of breath and wheezing.   Cardiovascular: Negative for chest pain, palpitations and leg swelling.  Endocrine: Negative for polydipsia and polyuria.  Neurological: Negative for dizziness, seizures, syncope, weakness, light-headedness and headaches.       Objective:   Physical  Exam  Constitutional: She appears well-developed and well-nourished.  Neck: Neck supple. No thyromegaly present.  Cardiovascular: Normal rate and regular rhythm.   Pulmonary/Chest: Effort normal and breath sounds normal. No respiratory distress. She has no wheezes. She has no rales.  Musculoskeletal: She exhibits no edema.  Skin:  Left great toe reveals superficial fissure tip of toe but no secondary infection. This is healing. Otherwise the exam normal. Good distal pulses. Normal sensory function with monofilament.          Assessment & Plan:  #1 type 2 diabetes. History of good control. Recheck A1c. Foot care discussed #2 hypertension minimally elevated today. Work on weight loss. Continue close monitoring #3 history of dyslipidemia. Patient remains on lovastatin. Repeat lipids at followup in 6 months. #4 probable viral URI. Reassurance. Treat with over-the-counter medications as needed.

## 2013-09-25 NOTE — Progress Notes (Signed)
Pre visit review using our clinic review tool, if applicable. No additional management support is needed unless otherwise documented below in the visit note. 

## 2013-09-26 ENCOUNTER — Telehealth: Payer: Self-pay | Admitting: Family Medicine

## 2013-09-26 MED ORDER — LEVOFLOXACIN 500 MG PO TABS: 500.0000 mg | ORAL_TABLET | Freq: Every day | ORAL | Status: DC

## 2013-09-26 NOTE — Telephone Encounter (Signed)
Pt informed

## 2013-09-26 NOTE — Telephone Encounter (Signed)
Pt states she is feeling worse today and would like that antibiotic called in for her to [facility]

## 2013-09-26 NOTE — Telephone Encounter (Signed)
levaquin 500 mg one daily for 7 days.

## 2013-09-26 NOTE — Telephone Encounter (Signed)
Pt stated that she is having drainage in the throat and coughing a lot.

## 2013-12-25 ENCOUNTER — Other Ambulatory Visit: Payer: Self-pay | Admitting: Family Medicine

## 2014-01-12 ENCOUNTER — Other Ambulatory Visit: Payer: Self-pay | Admitting: Family Medicine

## 2014-01-13 ENCOUNTER — Telehealth: Payer: Self-pay | Admitting: Family Medicine

## 2014-01-13 ENCOUNTER — Ambulatory Visit (INDEPENDENT_AMBULATORY_CARE_PROVIDER_SITE_OTHER): Payer: Commercial Managed Care - HMO | Admitting: Internal Medicine

## 2014-01-13 ENCOUNTER — Encounter: Payer: Self-pay | Admitting: Internal Medicine

## 2014-01-13 VITALS — BP 166/70 | Temp 98.3°F | Ht 63.25 in | Wt 211.0 lb

## 2014-01-13 DIAGNOSIS — E119 Type 2 diabetes mellitus without complications: Secondary | ICD-10-CM

## 2014-01-13 DIAGNOSIS — R112 Nausea with vomiting, unspecified: Secondary | ICD-10-CM | POA: Insufficient documentation

## 2014-01-13 DIAGNOSIS — R197 Diarrhea, unspecified: Secondary | ICD-10-CM

## 2014-01-13 MED ORDER — ONDANSETRON 4 MG PO TBDP: 4.0000 mg | ORAL_TABLET | Freq: Three times a day (TID) | ORAL | Status: DC | PRN

## 2014-01-13 NOTE — Telephone Encounter (Signed)
Patient Information:  Caller Name: Barnetta  Phone: [phone]  Patient: Brenda Patterson, Brenda Patterson  Gender: Female  DOB: 1941-01-12  Age: 73 Years  PCP: Carolann Littler (Family Practice)  Office Follow Up:  Does the office need to follow up with this patient?: No  Instructions For The Office: N/A   Symptoms  Reason For Call & Symptoms: Pt had vomiting virus this w/e - pt vomited x 3 -.last vomit on Sat 01/10/14. Pt has started with diarrhea - onset 01/12/14 - pt had 8 stools yesterday and 6x today so far. She has started to feel nauseated again. She tried to go into work but had to come home. Pt is calling for advice. Pt is eating small amounts and drinking ice chips/water and gingerale.  Reviewed Health History In EMR: Yes  Reviewed Medications In EMR: Yes  Reviewed Allergies In EMR: Yes  Reviewed Surgeries / Procedures: Yes  Date of Onset of Symptoms: 01/10/2014  Guideline(s) Used:  Diarrhea  Disposition Per Guideline:   Go to Office Now  Reason For Disposition Reached:   Age > 60 years and has had > 6 diarrhea stools in past 24 hours  Advice Given:  N/A  Patient Will Follow Care Advice:  YES  Appointment Scheduled:  01/13/2014 14:15:00 Appointment Scheduled Provider:  Shanon Ace (Family Practice)

## 2014-01-13 NOTE — Progress Notes (Signed)
Pre visit review using our clinic review tool, if applicable. No additional management support is needed unless otherwise documented below in the visi   Chief Complaint  Patient presents with  . Diarrhea    Started not feeling well one week ago.  Nausea and vomiting started Friday and stopped on Saturday night.  Had some diarrhea yesterday.  . Emesis    HPI: Patient comes in today for SDA for  new problem evaluation. PCP NA  Onset with ha like a migraine before  Onset .  Went to visit nursing home   Husband had diarrhea  But no vomiting  Vomiting and nausea getting better  But now diarrhea worse. After boiilded potato  Daughter and son in law  Some sx diarrhea.  Yesterday had some form . To stool but watery today and gassy   Taking in water  less nausea now had left over zofran old ? Help.  Remote Hx of stomach issues  Years ago but has been stable Had carcinod at stem of gb   Age about 10 years ago.  About once a year get stomach issue . About a week.  No antibiotic ( last November levaquin) Well water  ROS: See pertinent positives and negatives per HPI.no fever chills and no blood  .   No recent dm check has been ok   Past Medical History  Diagnosis Date  . Allergy   . Diabetes mellitus   . Hypertension   . Hyperlipidemia   . Arthritis   . Migraines   . 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 Arthritis      fhx  . Hyperlipidemia      fhx  . Hypertension      fhx  . Diabetes      fhx  . Stroke      fhx    History   Social History  . Marital Status: Married    Spouse Name: N/A    Number of Children: N/A  . Years of Education: N/A   Social History Main Topics  . Smoking status: Never Smoker   . Smokeless tobacco: None  . Alcohol Use: No  . Drug Use: No  . Sexual Activity: Yes   Other Topics Concern  . None   Social History Narrative   Works Arts development officer   Married      Outpatient Encounter Prescriptions as of 01/13/2014  Medication Sig  . Ascorbic Acid (VITAMIN C) 1000 MG tablet Take 1,000 mg by mouth daily.  Marland Kitchen aspirin EC 81 MG tablet Take 81 mg by mouth daily.    . Calcium Carbonate (CALCIUM 600 PO) Take 600-900 mg by mouth daily. Take one tablet by mouth every morning and take one and one-half tablet by mouth at bedtime   . carvedilol (COREG) 6.25 MG tablet TAKE ONE TABLET BY MOUTH TWICE DAILY  . cholecalciferol (VITAMIN D) 1000 UNITS tablet Take 1,000 Units by mouth daily.    . Cyanocobalamin (VITAMIN B-12) 1000 MCG SUBL Place 1,000 mcg under the tongue daily.    . felodipine (PLENDIL) 5 MG 24 hr tablet TAKE ONE TABLET BY MOUTH ONCE DAILY  . fish oil-omega-3 fatty acids 1000 MG capsule Take 1 g by mouth daily.   Marland Kitchen glipiZIDE (GLUCOTROL XL) 2.5 MG 24 hr tablet Take 2.5 mg by mouth daily.  . hydrochlorothiazide (HYDRODIURIL) 25 MG tablet Take  1 tablet (25 mg total) by mouth daily.  Marland Kitchen lisinopril (PRINIVIL,ZESTRIL) 40 MG tablet Take 1 tablet (40 mg total) by mouth daily.  Marland Kitchen lisinopril (PRINIVIL,ZESTRIL) 40 MG tablet TAKE ONE TABLET BY MOUTH EVERY DAY  . lovastatin (MEVACOR) 20 MG tablet TAKE TWO TABLETS BY MOUTH AT BEDTIME  . meloxicam (MOBIC) 15 MG tablet Take 15 mg by mouth as needed.   . meloxicam (MOBIC) 15 MG tablet TAKE ONE TABLET BY MOUTH EVERY DAY AS NEEDED  . Multiple Vitamin (MULTIVITAMIN) tablet Take 1 tablet by mouth daily.    . ondansetron (ZOFRAN) 4 MG tablet Take 1 tablet (4 mg total) by mouth every 6 (six) hours.  . sertraline (ZOLOFT) 50 MG tablet TAKE ONE TABLET BY MOUTH EVERY DAY  . ondansetron (ZOFRAN ODT) 4 MG disintegrating tablet Take 1 tablet (4 mg total) by mouth every 8 (eight) hours as needed for nausea or vomiting.  . [DISCONTINUED] levofloxacin (LEVAQUIN) 500 MG tablet Take 1 tablet (500 mg total) by mouth daily.  . [DISCONTINUED] lovastatin (MEVACOR) 20 MG tablet Take 40 mg by mouth at bedtime.    EXAM:  BP 166/70   Temp(Src) 98.3 F (36.8 C) (Oral)  Ht 5' 3.25" (1.607 m)  Wt 211 lb (95.709 kg)  BMI 37.06 kg/m2  Body mass index is 37.06 kg/(m^2).  GENERAL: vitals reviewed and listed above, alert, oriented, appears well hydrated and in no acute distress under the weather but non toxic   HEENT: atraumatic, conjunctiva  clear, no obvious abnormalities on inspection of external nose and ears OP : no lesion edema or exudate tongue midline moist membranes  NECK: no obvious masses on inspection palpation  LUNGS: clear to auscultation bilaterally, no wheezes, rales or rhonchi,  CV: HRRR, no clubbing cyanosis or  peripheral edema nl cap refill  Abdomen:  Sof,t increase  bowel sounds without hepatosplenomegaly, no guarding rebound or masses no CVA tenderness MS: moves all extremities without noticeable focal  abnormality PSYCH: pleasant and cooperative, no obvious depression or anxiety BP Readings from Last 3 Encounters:  01/13/14 166/70  09/25/13 144/82  04/03/13 150/90    ASSESSMENT AND PLAN:  Discussed the following assessment and plan:  Nausea vomiting and diarrhea - presumed viral or infection based on context  reassuring exam .  DIABETES MELLITUS, TYPE II Close observation warranted and plan further w/u if persistent or alarm features hydration adequate today  Should check her BGs  No hypoglycemia note for work  If worse check metabolic stool etc  bp not addressed cause better on prev readings -Patient advised to return or notify health care team  if symptoms worsen or persist or new concerns arise.  Patient Instructions  This probably a still a self limiting  Gi infection.  Because family has been ill.  However if vomiting persistent diarrhea not improved in the next few days then advise  Getting stool samples cultures etc to define  Cause of  Illness.  Ok to  [place] the zofran in the meantime.   Continue fluids .  Clear soups etc. And advance as tolerated .  Consider stool tests.  To check for  bacteria and  Other problem germs.  No work tomorrow   Can go back Monday.     Standley Brooking. Panosh M.D.  Total visit 64mins > 50% spent counseling and coordinating care

## 2014-01-13 NOTE — Patient Instructions (Addendum)
This probably a still a self limiting  Gi infection.  Because family has been ill.  However if vomiting persistent diarrhea not improved in the next few days then advise  Getting stool samples cultures etc to define  Cause of  Illness.  Ok to  [place] the zofran in the meantime.   Continue fluids .  Clear soups etc. And advance as tolerated .  Consider stool tests.  To check for bacteria and  Other problem germs.  No work tomorrow   Can go back Monday.

## 2014-01-16 ENCOUNTER — Telehealth: Payer: Self-pay

## 2014-01-16 ENCOUNTER — Telehealth: Payer: Self-pay | Admitting: Family Medicine

## 2014-01-16 NOTE — Telephone Encounter (Signed)
Pt states her insurance no longer will pay for her rx felodipine (PLENDIL) 5 MG 24 hr tablet, pt is needing to see if she can get a new rx for afeditab 30 mg cr, states its a generic that her insurance will cover, send to [facility] -Brenda Patterson

## 2014-01-16 NOTE — Telephone Encounter (Signed)
Relevant patient education mailed to patient.  

## 2014-01-18 NOTE — Telephone Encounter (Signed)
Yes.  We can try this change.

## 2014-01-19 MED ORDER — NIFEDIPINE ER 30 MG PO TB24: 30.0000 mg | ORAL_TABLET | Freq: Every day | ORAL | Status: DC

## 2014-01-19 NOTE — Telephone Encounter (Signed)
RX sent to pharmacy  

## 2014-01-25 ENCOUNTER — Other Ambulatory Visit: Payer: Self-pay | Admitting: Family Medicine

## 2014-02-05 ENCOUNTER — Other Ambulatory Visit: Payer: Self-pay | Admitting: Family Medicine

## 2014-02-12 ENCOUNTER — Telehealth: Payer: Self-pay | Admitting: Family Medicine

## 2014-02-12 DIAGNOSIS — Z0100 Encounter for examination of eyes and vision without abnormal findings: Secondary | ICD-10-CM

## 2014-02-12 NOTE — Telephone Encounter (Signed)
Pt need a referral to see optopmetrist Dr Earl Gala phone number; [phone]

## 2014-02-12 NOTE — Telephone Encounter (Signed)
Referral is ordered

## 2014-02-12 NOTE — Telephone Encounter (Signed)
OK to refer.

## 2014-03-11 ENCOUNTER — Other Ambulatory Visit: Payer: Self-pay | Admitting: Family Medicine

## 2014-03-26 ENCOUNTER — Ambulatory Visit: Payer: Medicare Other | Admitting: Family Medicine

## 2014-04-03 ENCOUNTER — Ambulatory Visit (INDEPENDENT_AMBULATORY_CARE_PROVIDER_SITE_OTHER): Payer: Commercial Managed Care - HMO | Admitting: Family Medicine

## 2014-04-03 ENCOUNTER — Encounter: Payer: Self-pay | Admitting: Family Medicine

## 2014-04-03 VITALS — BP 140/80 | HR 76 | Temp 97.6°F | Wt 216.0 lb

## 2014-04-03 DIAGNOSIS — E538 Deficiency of other specified B group vitamins: Secondary | ICD-10-CM

## 2014-04-03 DIAGNOSIS — E119 Type 2 diabetes mellitus without complications: Secondary | ICD-10-CM

## 2014-04-03 DIAGNOSIS — I10 Essential (primary) hypertension: Secondary | ICD-10-CM

## 2014-04-03 DIAGNOSIS — E785 Hyperlipidemia, unspecified: Secondary | ICD-10-CM

## 2014-04-03 LAB — LIPID PANEL
Cholesterol: 188 mg/dL (ref 0–200)
HDL: 35.1 mg/dL — ABNORMAL LOW (ref >39.00)
LDL Cholesterol: 90 mg/dL (ref 0–99)
Total CHOL/HDL Ratio: 5
Triglycerides: 314 mg/dL — ABNORMAL HIGH (ref 0.0–149.0)
VLDL: 62.8 mg/dL — ABNORMAL HIGH (ref 0.0–40.0)

## 2014-04-03 LAB — HEPATIC FUNCTION PANEL
ALT: 23 U/L (ref 0–35)
AST: 23 U/L (ref 0–37)
Albumin: 3.8 g/dL (ref 3.5–5.2)
Alkaline Phosphatase: 77 U/L (ref 39–117)
Bilirubin, Direct: 0 mg/dL (ref 0.0–0.3)
Total Bilirubin: 0.6 mg/dL (ref 0.2–1.2)
Total Protein: 7.1 g/dL (ref 6.0–8.3)

## 2014-04-03 LAB — BASIC METABOLIC PANEL
BUN: 28 mg/dL — ABNORMAL HIGH (ref 6–23)
CO2: 27 mEq/L (ref 19–32)
Calcium: 9.4 mg/dL (ref 8.4–10.5)
Chloride: 104 mEq/L (ref 96–112)
Creatinine, Ser: 1.3 mg/dL — ABNORMAL HIGH (ref 0.4–1.2)
GFR: 44.76 mL/min — ABNORMAL LOW (ref >60.00)
Glucose, Bld: 134 mg/dL — ABNORMAL HIGH (ref 70–99)
Potassium: 4 mEq/L (ref 3.5–5.1)
Sodium: 139 mEq/L (ref 135–145)

## 2014-04-03 LAB — VITAMIN B12: Vitamin B-12: >1500 pg/mL — ABNORMAL HIGH (ref 211–911)

## 2014-04-03 LAB — HEMOGLOBIN A1C: Hgb A1c MFr Bld: 7 % — ABNORMAL HIGH (ref 4.6–6.5)

## 2014-04-03 LAB — HM DIABETES FOOT EXAM: HM Diabetic Foot Exam: NORMAL

## 2014-04-03 MED ORDER — NIFEDIPINE ER 30 MG PO TB24: 30.0000 mg | ORAL_TABLET | Freq: Every day | ORAL | Status: DC

## 2014-04-03 NOTE — Progress Notes (Signed)
   Subjective:    Patient ID: Brenda Patterson, female    DOB: 1941-08-20, 73 y.o.   MRN: [id number]  HPI  Patient seen for medical followup. She has history of obesity, type 2 diabetes, hypertension, hyperlipidemia, depression. Medications reviewed. Compliant with all. She states that she had B12 deficiency remotely and has not had levels in quite some time. She takes oral replacement. Diabetes has been stable. No symptoms of polyuria or polydipsia. She takes lovastatin for hyperlipidemia. No recent chest pains. No cardiac history. She takes multiple medications for blood pressure. Blood pressure stable by home readings.  Past Medical History  Diagnosis Date  . Allergy   . Diabetes mellitus   . Hypertension   . Hyperlipidemia   . Arthritis   . Migraines   . 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 Tubal ligation    . Cholecystectomy      reports that she has never smoked. She does not have any smokeless tobacco history on file. She reports that she does not drink alcohol or use illicit drugs. family history includes Arthritis in an other family member; Diabetes in an other family member; Hyperlipidemia in an other family member; Hypertension in an other family member; Stroke in an other family member. Allergies  Allergen Reactions  . Azithromycin Nausea Only  . Penicillins Rash      Review of Systems  Constitutional: Negative for fatigue.  Eyes: Negative for visual disturbance.  Respiratory: Negative for cough, chest tightness, shortness of breath and wheezing.   Cardiovascular: Negative for chest pain, palpitations and leg swelling.  Neurological: Negative for dizziness, seizures, syncope, weakness, light-headedness and headaches.       Objective:   Physical Exam  Constitutional: She is oriented to person, place, and time. She appears well-developed and well-nourished.  HENT:  Mouth/Throat: Oropharynx  is clear and moist.  Neck: Neck supple. No thyromegaly present.  Cardiovascular: Normal rate and regular rhythm.  Exam reveals no gallop.   Pulmonary/Chest: Effort normal and breath sounds normal. No respiratory distress. She has no wheezes. She has no rales.  Musculoskeletal: She exhibits edema.  Neurological: She is alert and oriented to person, place, and time.  Skin:  Feet reveal no skin lesions. Good distal foot pulses. Good capillary refill. No calluses. Normal sensation with monofilament testing           Assessment & Plan:  #1 type 2 diabetes. Stable by home readings. Recheck A1c #2 history of reported B12 deficiency. Recheck B12 level. She is on oral replacement #3 hyperlipidemia. Check lipid and hepatic panel #4 hypertension well controlled by home readings. Refill Nifedipine for one year.  Lose weight and consider titration to 60 mg if not further improved at follow up.

## 2014-04-03 NOTE — Progress Notes (Signed)
Pre visit review using our clinic review tool, if applicable. No additional management support is needed unless otherwise documented below in the visit note. 

## 2014-04-08 ENCOUNTER — Other Ambulatory Visit: Payer: Self-pay | Admitting: Family Medicine

## 2014-04-23 LAB — HM DIABETES EYE EXAM

## 2014-04-27 ENCOUNTER — Other Ambulatory Visit: Payer: Self-pay | Admitting: Family Medicine

## 2014-04-29 ENCOUNTER — Encounter: Payer: Self-pay | Admitting: Family Medicine

## 2014-06-23 ENCOUNTER — Other Ambulatory Visit: Payer: Self-pay | Admitting: Family Medicine

## 2014-06-24 ENCOUNTER — Other Ambulatory Visit: Payer: Self-pay

## 2014-06-24 DIAGNOSIS — Z1231 Encounter for screening mammogram for malignant neoplasm of breast: Secondary | ICD-10-CM

## 2014-07-10 ENCOUNTER — Ambulatory Visit
Admission: RE | Admit: 2014-07-10 | Discharge: 2014-07-10 | Disposition: A | Discharge status: Home / Self Care | Payer: Commercial Managed Care - HMO | Source: Ambulatory Visit

## 2014-07-10 DIAGNOSIS — Z1231 Encounter for screening mammogram for malignant neoplasm of breast: Secondary | ICD-10-CM

## 2014-08-17 ENCOUNTER — Encounter: Payer: Self-pay | Admitting: Physician Assistant

## 2014-08-17 ENCOUNTER — Ambulatory Visit (INDEPENDENT_AMBULATORY_CARE_PROVIDER_SITE_OTHER): Payer: Commercial Managed Care - HMO | Admitting: Physician Assistant

## 2014-08-17 VITALS — BP 124/70 | HR 66 | Temp 98.1°F | Resp 18 | Wt 214.3 lb

## 2014-08-17 DIAGNOSIS — J069 Acute upper respiratory infection, unspecified: Secondary | ICD-10-CM

## 2014-08-17 MED ORDER — BENZONATATE 100 MG PO CAPS: 100.0000 mg | ORAL_CAPSULE | Freq: Three times a day (TID) | ORAL | Status: DC | PRN

## 2014-08-17 NOTE — Progress Notes (Signed)
Subjective:    Patient ID: Brenda Patterson, female    DOB: [birth year], 73 y.o.   MRN: [id number]  Sinusitis This is a new problem. The current episode started in the past 7 days (4 days). The problem has been gradually worsening since onset. There has been no fever. Her pain is at a severity of 2/10. The pain is mild. Associated symptoms include congestion, coughing (sputum), ear pain (mild right ear pain), headaches, a hoarse voice, sinus pressure and a sore throat. Pertinent negatives include no chills, diaphoresis, neck pain, shortness of breath, sneezing or swollen glands. Treatments tried: decongestant pill. The treatment provided mild relief.      Review of Systems  Constitutional: Negative for fever, chills and diaphoresis.  HENT: Positive for congestion, ear pain (mild right ear pain), hoarse voice, postnasal drip, sinus pressure and sore throat. Negative for sneezing.   Respiratory: Positive for cough (sputum). Negative for chest tightness, shortness of breath and wheezing.   Cardiovascular: Negative for chest pain.  Gastrointestinal: Negative for nausea, vomiting and diarrhea.  Musculoskeletal: Negative for neck pain.  Neurological: Positive for headaches. Negative for syncope.  All other systems reviewed and are negative.     Past Medical History  Diagnosis Date  . Allergy   . Diabetes mellitus   . Hypertension   . Hyperlipidemia   . Arthritis   . Migraines   . 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 Marital Status: Married    Spouse Name: N/A    Number of Children: N/A  . Years of Education: N/A   Occupational History  . Not on file.   Social History Main Topics  . Smoking status: Never Smoker   . Smokeless tobacco: Not on file  . Alcohol Use: No  . Drug Use: No  . Sexual Activity: Yes   Other Topics Concern  . Not on file   Social History Narrative   Works Arts development officer   Married     Past  Surgical History  Procedure Laterality Date  . Tubal ligation    . Cholecystectomy      Family History  Problem Relation Age of Onset  . Arthritis      fhx  . Hyperlipidemia      fhx  . Hypertension      fhx  . Diabetes      fhx  . Stroke      fhx    Allergies  Allergen Reactions  . Azithromycin Nausea Only  . Penicillins Rash    Current Outpatient Prescriptions on File Prior to Visit  Medication Sig Dispense Refill  . Ascorbic Acid (VITAMIN C) 1000 MG tablet Take 1,000 mg by mouth daily.      Marland Kitchen aspirin EC 81 MG tablet Take 81 mg by mouth daily.        . Calcium Carbonate (CALCIUM 600 PO) Take 600-900 mg by mouth daily. Take one tablet by mouth every morning and take one and one-half tablet by mouth at bedtime       . carvedilol (COREG) 6.25 MG tablet TAKE ONE TABLET BY MOUTH TWICE DAILY  180 tablet  1  . cholecalciferol (VITAMIN D) 1000 UNITS tablet Take 1,000 Units by mouth daily.        . Cyanocobalamin (VITAMIN B-12) 1000 MCG SUBL Place 1,000 mcg under the tongue daily.        . fish  oil-omega-3 fatty acids 1000 MG capsule Take 1 g by mouth daily.       Marland Kitchen glipiZIDE (GLUCOTROL XL) 2.5 MG 24 hr tablet TAKE ONE TABLET BY MOUTH ONCE DAILY  90 tablet  1  . hydrochlorothiazide (HYDRODIURIL) 25 MG tablet TAKE ONE TABLET BY MOUTH ONCE DAILY  90 tablet  3  . lisinopril (PRINIVIL,ZESTRIL) 40 MG tablet Take 1 tablet (40 mg total) by mouth daily.  90 tablet  3  . lovastatin (MEVACOR) 20 MG tablet TAKE TWO TABLETS BY MOUTH AT BEDTIME  180 tablet  3  . meloxicam (MOBIC) 15 MG tablet TAKE ONE TABLET BY MOUTH ONCE DAILY AS NEEDED  30 tablet  5  . metFORMIN (GLUCOPHAGE) 500 MG tablet TAKE TWO TABLETS BY MOUTH IN THE MORNING AND ONE IN THE EVENING  270 tablet  3  . Multiple Vitamin (MULTIVITAMIN) tablet Take 1 tablet by mouth daily.        Marland Kitchen NIFEdipine (PROCARDIA-XL/ADALAT CC) 30 MG 24 hr tablet Take 1 tablet (30 mg total) by mouth daily.  90 tablet  3  . sertraline (ZOLOFT) 50 MG tablet  TAKE ONE TABLET BY MOUTH ONCE DAILY  90 tablet  1   No current facility-administered medications on file prior to visit.    EXAM: BP 124/70  Pulse 66  Temp(Src) 98.1 F (36.7 C) (Oral)  Resp 18  Wt 214 lb 4.8 oz (97.206 kg)     Objective:   Physical Exam  Nursing note and vitals reviewed. Constitutional: She is oriented to person, place, and time. She appears well-developed and well-nourished. No distress.  HENT:  Head: Normocephalic and atraumatic.  Right Ear: External ear normal.  Left Ear: External ear normal.  Nose: Nose normal.  Mouth/Throat: No oropharyngeal exudate.  Oropharynx is slightly erythematous, no exudate. Bilateral TMs normal. Bilateral frontal and maxillary sinuses non-TTP.  Eyes: Conjunctivae and EOM are normal. Pupils are equal, round, and reactive to light.  Neck: Normal range of motion. Neck supple.  Cardiovascular: Normal rate, regular rhythm and intact distal pulses.   Pulmonary/Chest: Effort normal and breath sounds normal. No stridor. No respiratory distress. She has no wheezes. She has no rales. She exhibits no tenderness.  Lymphadenopathy:    She has no cervical adenopathy.  Neurological: She is alert and oriented to person, place, and time.  Skin: Skin is warm and dry. She is not diaphoretic. No pallor.  Psychiatric: She has a normal mood and affect. Her behavior is normal. Judgment and thought content normal.     Lab Results  Component Value Date   WBC 5.2 11/21/2012   HGB 12.3 11/21/2012   HCT 35.7* 11/21/2012   PLT 228 11/21/2012   GLUCOSE 134* 04/03/2014   CHOL 188 04/03/2014   TRIG 314.0* 04/03/2014   HDL 35.10* 04/03/2014   LDLDIRECT 110.1 04/03/2013   LDLCALC 90 04/03/2014   ALT 23 04/03/2014   AST 23 04/03/2014   NA 139 04/03/2014   K 4.0 04/03/2014   CL 104 04/03/2014   CREATININE 1.3* 04/03/2014   BUN 28* 04/03/2014   CO2 27 04/03/2014   HGBA1C 7.0* 04/03/2014        Assessment & Plan:  Brenda Patterson was seen today for  sinusitis.  Diagnoses and associated orders for this visit:  Acute upper respiratory infection Comments: Symptomatic treatment with Tessalon Perles, OTC Mucinex, nasal steroid, antihistamine, rest, push fluids, watchful waiting. - benzonatate (TESSALON) 100 MG capsule; Take 1 capsule (100 mg total) by mouth 3 (three) times  daily as needed for cough.    Discussed frequent hand washing, keeping surfaces clean, and mask wearing to prevent spread of infection to pts daughter. Also, advised pt to stay away from daughter if she develops fever or diarrhea. Also advised that there is no guarantee with viruses that you can prevent transmission of infection to others. The pt is amenable to this plan.  Return precautions provided, and patient handout on URI.  Plan to follow up as needed, or for worsening or persistent symptoms despite treatment.  Patient Instructions  Tessalon Perles every 8 hours as needed for cough.  Plain Over the Counter Mucinex (NOT Mucinex D) for thick secretions  Force NON dairy fluids, drinking plenty of water is best.    Over the Counter Flonase OR Nasacort AQ 1 spray in each nostril twice a day as needed. Use the "crossover" technique into opposite nostril spraying toward opposite ear @ 45 degree angle, not straight up into nostril.   Plain Over the Counter Allegra (NOT D )  160 daily , OR Loratidine 10 mg , OR Zyrtec 10 mg @ bedtime  as needed for itchy eyes & sneezing.  Saline Irrigation and Saline Sprays can also help reduce symptoms.  If emergency symptoms discussed during visit developed, seek medical attention immediately.  Followup as needed, or for worsening or persistent symptoms despite treatment.

## 2014-08-17 NOTE — Patient Instructions (Addendum)
Tessalon Perles every 8 hours as needed for cough.  Plain Over the Counter Mucinex (NOT Mucinex D) for thick secretions  Force NON dairy fluids, drinking plenty of water is best.    Over the Counter Flonase OR Nasacort AQ 1 spray in each nostril twice a day as needed. Use the "crossover" technique into opposite nostril spraying toward opposite ear @ 45 degree angle, not straight up into nostril.   Plain Over the Counter Allegra (NOT D )  160 daily , OR Loratidine 10 mg , OR Zyrtec 10 mg @ bedtime  as needed for itchy eyes & sneezing.  Saline Irrigation and Saline Sprays can also help reduce symptoms.  If emergency symptoms discussed during visit developed, seek medical attention immediately.  Followup as needed, or for worsening or persistent symptoms despite treatment.     Upper Respiratory Infection, Adult An upper respiratory infection (URI) is also known as the common cold. It is often caused by a type of germ (virus). Colds are easily spread (contagious). You can pass it to others by kissing, coughing, sneezing, or drinking out of the same glass. Usually, you get better in 1 or 2 weeks.  HOME CARE   Only take medicine as told by your doctor.  Use a warm mist humidifier or breathe in steam from a hot shower.  Drink enough water and fluids to keep your pee (urine) clear or pale yellow.  Get plenty of rest.  Return to work when your temperature is back to normal or as told by your doctor. You may use a face mask and wash your hands to stop your cold from spreading. GET HELP RIGHT AWAY IF:   After the first few days, you feel you are getting worse.  You have questions about your medicine.  You have chills, shortness of breath, or brown or red spit (mucus).  You have yellow or brown snot (nasal discharge) or pain in the face, especially when you bend forward.  You have a fever, puffy (swollen) neck, pain when you swallow, or white spots in the back of your throat.  You  have a bad headache, ear pain, sinus pain, or chest pain.  You have a high-pitched whistling sound when you breathe in and out (wheezing).  You have a lasting cough or cough up blood.  You have sore muscles or a stiff neck. MAKE SURE YOU:   Understand these instructions.  Will watch your condition.  Will get help right away if you are not doing well or get worse. Document Released: 04/10/2008 Document Revised: 01/15/2012 Document Reviewed: 01/28/2014 [facility] Patient Information 2015 [place], [place]. This information is not intended to replace advice given to you by your health care provider. Make sure you discuss any questions you have with your health care provider.

## 2014-08-17 NOTE — Progress Notes (Signed)
Pre visit review using our clinic review tool, if applicable. No additional management support is needed unless otherwise documented below in the visit note. 

## 2014-08-20 ENCOUNTER — Other Ambulatory Visit: Payer: Self-pay | Admitting: Family Medicine

## 2014-08-31 ENCOUNTER — Telehealth: Payer: Self-pay | Admitting: Family Medicine

## 2014-08-31 MED ORDER — FLUCONAZOLE 150 MG PO TABS: 150.0000 mg | ORAL_TABLET | Freq: Once | ORAL | Status: DC

## 2014-08-31 NOTE — Telephone Encounter (Signed)
Fluconazole 150 mg times one dose.

## 2014-08-31 NOTE — Telephone Encounter (Signed)
Pt has yeast infection and has tried OTC no relief. Pt would like diflucan call into [facility] [place]

## 2014-08-31 NOTE — Telephone Encounter (Signed)
Rx sent to pharmacy   

## 2014-08-31 NOTE — Telephone Encounter (Signed)
Pt saw Rodman Key on 08/17/14

## 2014-09-28 ENCOUNTER — Other Ambulatory Visit: Payer: Self-pay | Admitting: Family Medicine

## 2014-10-04 ENCOUNTER — Other Ambulatory Visit: Payer: Self-pay | Admitting: Family Medicine

## 2014-10-08 ENCOUNTER — Ambulatory Visit: Payer: Commercial Managed Care - HMO | Admitting: Family Medicine

## 2014-10-15 ENCOUNTER — Ambulatory Visit: Payer: Commercial Managed Care - HMO | Admitting: Family Medicine

## 2014-10-21 ENCOUNTER — Encounter: Payer: Self-pay | Admitting: Family Medicine

## 2014-10-21 ENCOUNTER — Ambulatory Visit (INDEPENDENT_AMBULATORY_CARE_PROVIDER_SITE_OTHER): Payer: Commercial Managed Care - HMO

## 2014-10-21 ENCOUNTER — Ambulatory Visit (INDEPENDENT_AMBULATORY_CARE_PROVIDER_SITE_OTHER): Payer: Commercial Managed Care - HMO | Admitting: Family Medicine

## 2014-10-21 VITALS — BP 140/78 | HR 74 | Temp 97.9°F | Wt 213.0 lb

## 2014-10-21 DIAGNOSIS — Z23 Encounter for immunization: Secondary | ICD-10-CM

## 2014-10-21 DIAGNOSIS — I10 Essential (primary) hypertension: Secondary | ICD-10-CM

## 2014-10-21 DIAGNOSIS — E119 Type 2 diabetes mellitus without complications: Secondary | ICD-10-CM

## 2014-10-21 DIAGNOSIS — Z8659 Personal history of other mental and behavioral disorders: Secondary | ICD-10-CM

## 2014-10-21 LAB — HEMOGLOBIN A1C: Hgb A1c MFr Bld: 7 % — ABNORMAL HIGH (ref 4.6–6.5)

## 2014-10-21 NOTE — Progress Notes (Signed)
   Subjective:    Patient ID: Brenda Patterson, female    DOB: 11-24-1940, 73 y.o.   MRN: [id number]  HPI Medical follow-up  Type 2 diabetes. History of good control. Does not monitor sugars regularly. No symptoms of polydipsia or polyuria. Remains on metformin and glipizide. No hypoglycemia  Hypertension. Controlled by home readings. Consistently less than 140/90. Compliant with medications.  History of depression stable. Her daughter was diagnosed with thyroid cancer this year and she has been helping her. Overall doing well.  Past Medical History  Diagnosis Date  . Allergy   . Diabetes mellitus   . Hypertension   . Hyperlipidemia   . Arthritis   . Migraines   . 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 Tubal ligation    . Cholecystectomy      reports that she has never smoked. She does not have any smokeless tobacco history on file. She reports that she does not drink alcohol or use illicit drugs. family history includes Arthritis in an other family member; Diabetes in an other family member; Hyperlipidemia in an other family member; Hypertension in an other family member; Stroke in an other family member. Allergies  Allergen Reactions  . Azithromycin Nausea Only  . Penicillins Rash      Review of Systems  Constitutional: Negative for fatigue.  Eyes: Negative for visual disturbance.  Respiratory: Negative for cough, chest tightness, shortness of breath and wheezing.   Cardiovascular: Negative for chest pain, palpitations and leg swelling.  Endocrine: Negative for polydipsia and polyuria.  Neurological: Negative for dizziness, seizures, syncope, weakness, light-headedness and headaches.       Objective:   Physical Exam  Constitutional: She appears well-developed and well-nourished. No distress.  Neck: Neck supple. No thyromegaly present.  Cardiovascular: Normal rate and regular rhythm.     Pulmonary/Chest: Effort normal and breath sounds normal. No respiratory distress. She has no wheezes. She has no rales.  Musculoskeletal: She exhibits no edema.  Skin:  Feet reveal no skin lesions. Good distal foot pulses. Good capillary refill. No calluses. Normal sensation with monofilament testing           Assessment & Plan:  #1 type 2 diabetes. History of good control. Recheck A1c. #2 hypertension stable by home readings. Continue current medications #3 depression stable on sertraline #4 health maintenance. Flu vaccine given. Prevnar 13 given. Encouraged to lose some weight

## 2014-10-21 NOTE — Addendum Note (Signed)
Addended by: Noe Gens E on: 10/21/2014 01:40 PM   Modules accepted: Orders

## 2014-10-21 NOTE — Progress Notes (Signed)
Pre visit review using our clinic review tool, if applicable. No additional management support is needed unless otherwise documented below in the visit note. 

## 2014-10-22 ENCOUNTER — Telehealth: Payer: Self-pay | Admitting: Family Medicine

## 2014-10-22 NOTE — Telephone Encounter (Signed)
emmi emailed °

## 2014-12-25 DIAGNOSIS — R1084 Generalized abdominal pain: Secondary | ICD-10-CM | POA: Diagnosis not present

## 2014-12-25 DIAGNOSIS — R531 Weakness: Secondary | ICD-10-CM | POA: Diagnosis not present

## 2014-12-25 DIAGNOSIS — R1110 Vomiting, unspecified: Secondary | ICD-10-CM | POA: Diagnosis not present

## 2014-12-25 DIAGNOSIS — R05 Cough: Secondary | ICD-10-CM | POA: Diagnosis not present

## 2014-12-26 ENCOUNTER — Encounter (HOSPITAL_COMMUNITY): Payer: Self-pay | Admitting: Emergency Medicine

## 2014-12-26 ENCOUNTER — Inpatient Hospital Stay (HOSPITAL_COMMUNITY)
Admission: EM | Admit: 2014-12-26 | Discharge: 2014-12-28 | DRG: 193 | Disposition: A | Discharge status: Home / Self Care | Payer: Medicare Other | Attending: Internal Medicine | Admitting: Internal Medicine

## 2014-12-26 ENCOUNTER — Emergency Department (HOSPITAL_COMMUNITY): Payer: Medicare Other

## 2014-12-26 DIAGNOSIS — I10 Essential (primary) hypertension: Secondary | ICD-10-CM

## 2014-12-26 DIAGNOSIS — Z8249 Family history of ischemic heart disease and other diseases of the circulatory system: Secondary | ICD-10-CM

## 2014-12-26 DIAGNOSIS — E860 Dehydration: Secondary | ICD-10-CM | POA: Diagnosis not present

## 2014-12-26 DIAGNOSIS — Z833 Family history of diabetes mellitus: Secondary | ICD-10-CM

## 2014-12-26 DIAGNOSIS — E785 Hyperlipidemia, unspecified: Secondary | ICD-10-CM | POA: Diagnosis present

## 2014-12-26 DIAGNOSIS — J9601 Acute respiratory failure with hypoxia: Secondary | ICD-10-CM | POA: Diagnosis present

## 2014-12-26 DIAGNOSIS — E1165 Type 2 diabetes mellitus with hyperglycemia: Secondary | ICD-10-CM | POA: Diagnosis not present

## 2014-12-26 DIAGNOSIS — E119 Type 2 diabetes mellitus without complications: Secondary | ICD-10-CM

## 2014-12-26 DIAGNOSIS — N183 Chronic kidney disease, stage 3 (moderate): Secondary | ICD-10-CM | POA: Diagnosis present

## 2014-12-26 DIAGNOSIS — J189 Pneumonia, unspecified organism: Secondary | ICD-10-CM | POA: Diagnosis not present

## 2014-12-26 DIAGNOSIS — R509 Fever, unspecified: Secondary | ICD-10-CM | POA: Diagnosis not present

## 2014-12-26 DIAGNOSIS — N179 Acute kidney failure, unspecified: Secondary | ICD-10-CM | POA: Diagnosis present

## 2014-12-26 DIAGNOSIS — D638 Anemia in other chronic diseases classified elsewhere: Secondary | ICD-10-CM | POA: Diagnosis not present

## 2014-12-26 DIAGNOSIS — E669 Obesity, unspecified: Secondary | ICD-10-CM | POA: Diagnosis present

## 2014-12-26 DIAGNOSIS — R05 Cough: Secondary | ICD-10-CM | POA: Diagnosis not present

## 2014-12-26 DIAGNOSIS — I129 Hypertensive chronic kidney disease with stage 1 through stage 4 chronic kidney disease, or unspecified chronic kidney disease: Secondary | ICD-10-CM | POA: Diagnosis not present

## 2014-12-26 DIAGNOSIS — Z792 Long term (current) use of antibiotics: Secondary | ICD-10-CM

## 2014-12-26 DIAGNOSIS — Z823 Family history of stroke: Secondary | ICD-10-CM | POA: Diagnosis not present

## 2014-12-26 DIAGNOSIS — R112 Nausea with vomiting, unspecified: Secondary | ICD-10-CM | POA: Diagnosis not present

## 2014-12-26 DIAGNOSIS — Z7982 Long term (current) use of aspirin: Secondary | ICD-10-CM

## 2014-12-26 DIAGNOSIS — R739 Hyperglycemia, unspecified: Secondary | ICD-10-CM

## 2014-12-26 LAB — COMPREHENSIVE METABOLIC PANEL
ALT: 23 U/L (ref 0–35)
AST: 30 U/L (ref 0–37)
Albumin: 2.9 g/dL — ABNORMAL LOW (ref 3.5–5.2)
Alkaline Phosphatase: 60 U/L (ref 39–117)
Anion gap: 5 (ref 5–15)
BUN: 18 mg/dL (ref 6–23)
CO2: 26 mmol/L (ref 19–32)
Calcium: 7.8 mg/dL — ABNORMAL LOW (ref 8.4–10.5)
Chloride: 106 mmol/L (ref 96–112)
Creatinine, Ser: 1.19 mg/dL — ABNORMAL HIGH (ref 0.50–1.10)
GFR calc Af Amer: 52 mL/min — ABNORMAL LOW (ref >90)
GFR calc non Af Amer: 44 mL/min — ABNORMAL LOW (ref >90)
Glucose, Bld: 177 mg/dL — ABNORMAL HIGH (ref 70–99)
Potassium: 3.8 mmol/L (ref 3.5–5.1)
Sodium: 137 mmol/L (ref 135–145)
Total Bilirubin: 0.5 mg/dL (ref 0.3–1.2)
Total Protein: 6 g/dL (ref 6.0–8.3)

## 2014-12-26 LAB — RETICULOCYTES
RBC.: 3.27 MIL/uL — ABNORMAL LOW (ref 3.87–5.11)
Retic Count, Absolute: 45.8 10*3/uL (ref 19.0–186.0)
Retic Ct Pct: 1.4 % (ref 0.4–3.1)

## 2014-12-26 LAB — CBC WITH DIFFERENTIAL/PLATELET
Basophils Absolute: 0 10*3/uL (ref 0.0–0.1)
Basophils Relative: 0 % (ref 0–1)
Eosinophils Absolute: 0.1 10*3/uL (ref 0.0–0.7)
Eosinophils Relative: 1 % (ref 0–5)
HCT: 30.9 % — ABNORMAL LOW (ref 36.0–46.0)
Hemoglobin: 10.4 g/dL — ABNORMAL LOW (ref 12.0–15.0)
Lymphocytes Relative: 24 % (ref 12–46)
Lymphs Abs: 2.4 10*3/uL (ref 0.7–4.0)
MCH: 30.3 pg (ref 26.0–34.0)
MCHC: 33.7 g/dL (ref 30.0–36.0)
MCV: 90.1 fL (ref 78.0–100.0)
Monocytes Absolute: 0.8 10*3/uL (ref 0.1–1.0)
Monocytes Relative: 8 % (ref 3–12)
Neutro Abs: 6.5 10*3/uL (ref 1.7–7.7)
Neutrophils Relative %: 67 % (ref 43–77)
Platelets: 165 10*3/uL (ref 150–400)
RBC: 3.43 MIL/uL — ABNORMAL LOW (ref 3.87–5.11)
RDW: 12.7 % (ref 11.5–15.5)
WBC: 9.8 10*3/uL (ref 4.0–10.5)

## 2014-12-26 LAB — INFLUENZA PANEL BY PCR (TYPE A & B)
H1N1 flu by pcr: NOT DETECTED
Influenza A By PCR: NEGATIVE
Influenza B By PCR: NEGATIVE

## 2014-12-26 LAB — BASIC METABOLIC PANEL
Anion gap: 5 (ref 5–15)
BUN: 18 mg/dL (ref 6–23)
CO2: 24 mmol/L (ref 19–32)
Calcium: 7.7 mg/dL — ABNORMAL LOW (ref 8.4–10.5)
Chloride: 106 mmol/L (ref 96–112)
Creatinine, Ser: 1.21 mg/dL — ABNORMAL HIGH (ref 0.50–1.10)
GFR calc Af Amer: 51 mL/min — ABNORMAL LOW (ref >90)
GFR calc non Af Amer: 44 mL/min — ABNORMAL LOW (ref >90)
Glucose, Bld: 236 mg/dL — ABNORMAL HIGH (ref 70–99)
Potassium: 3.7 mmol/L (ref 3.5–5.1)
Sodium: 135 mmol/L (ref 135–145)

## 2014-12-26 LAB — CBC
HCT: 30 % — ABNORMAL LOW (ref 36.0–46.0)
Hemoglobin: 9.9 g/dL — ABNORMAL LOW (ref 12.0–15.0)
MCH: 29.8 pg (ref 26.0–34.0)
MCHC: 33 g/dL (ref 30.0–36.0)
MCV: 90.4 fL (ref 78.0–100.0)
Platelets: 158 10*3/uL (ref 150–400)
RBC: 3.32 MIL/uL — ABNORMAL LOW (ref 3.87–5.11)
RDW: 12.8 % (ref 11.5–15.5)
WBC: 8 10*3/uL (ref 4.0–10.5)

## 2014-12-26 MED ORDER — ASPIRIN EC 81 MG PO TBEC
81.0000 mg | DELAYED_RELEASE_TABLET | Freq: Every day | ORAL | Status: DC
  Administered 2014-12-26 – 2014-12-28 (×3): 81 mg via ORAL
  Filled 2014-12-26 (×3): qty 1

## 2014-12-26 MED ORDER — SODIUM CHLORIDE 0.9 % IV SOLN
INTRAVENOUS | Status: DC
  Administered 2014-12-26 – 2014-12-27 (×3): via INTRAVENOUS

## 2014-12-26 MED ORDER — ONDANSETRON HCL 4 MG PO TABS: 4.0000 mg | ORAL_TABLET | Freq: Four times a day (QID) | ORAL | Status: DC | PRN

## 2014-12-26 MED ORDER — HEPARIN SODIUM (PORCINE) 5000 UNIT/ML IJ SOLN
5000.0000 [IU] | Freq: Three times a day (TID) | INTRAMUSCULAR | Status: DC
  Administered 2014-12-26 – 2014-12-27 (×3): 5000 [IU] via SUBCUTANEOUS
  Filled 2014-12-26 (×3): qty 1

## 2014-12-26 MED ORDER — GLIPIZIDE ER 2.5 MG PO TB24
2.5000 mg | ORAL_TABLET | Freq: Every day | ORAL | Status: DC
  Administered 2014-12-26 – 2014-12-28 (×3): 2.5 mg via ORAL
  Filled 2014-12-26 (×5): qty 1

## 2014-12-26 MED ORDER — CALCIUM CARBONATE 1250 (500 CA) MG PO TABS
1.0000 | ORAL_TABLET | Freq: Every day | ORAL | Status: DC
  Administered 2014-12-26 – 2014-12-28 (×3): 500 mg via ORAL
  Filled 2014-12-26 (×3): qty 1

## 2014-12-26 MED ORDER — ALBUTEROL SULFATE (2.5 MG/3ML) 0.083% IN NEBU
5.0000 mg | INHALATION_SOLUTION | Freq: Once | RESPIRATORY_TRACT | Status: AC
  Administered 2014-12-26: 5 mg via RESPIRATORY_TRACT
  Filled 2014-12-26: qty 6

## 2014-12-26 MED ORDER — NIFEDIPINE ER OSMOTIC RELEASE 30 MG PO TB24
30.0000 mg | ORAL_TABLET | Freq: Every day | ORAL | Status: DC
  Administered 2014-12-26 – 2014-12-28 (×3): 30 mg via ORAL
  Filled 2014-12-26 (×3): qty 1

## 2014-12-26 MED ORDER — CALCIUM CARBONATE 1250 (500 CA) MG PO TABS
2.0000 | ORAL_TABLET | Freq: Every day | ORAL | Status: DC
  Administered 2014-12-26 – 2014-12-27 (×2): 1000 mg via ORAL
  Filled 2014-12-26 (×2): qty 2

## 2014-12-26 MED ORDER — ACETAMINOPHEN 650 MG RE SUPP: 650.0000 mg | Freq: Four times a day (QID) | RECTAL | Status: DC | PRN

## 2014-12-26 MED ORDER — ACETAMINOPHEN 325 MG PO TABS
650.0000 mg | ORAL_TABLET | Freq: Once | ORAL | Status: AC
  Administered 2014-12-26: 650 mg via ORAL
  Filled 2014-12-26: qty 2

## 2014-12-26 MED ORDER — CARVEDILOL 3.125 MG PO TABS
6.2500 mg | ORAL_TABLET | Freq: Two times a day (BID) | ORAL | Status: DC
  Administered 2014-12-26 – 2014-12-28 (×5): 6.25 mg via ORAL
  Filled 2014-12-26 (×5): qty 2

## 2014-12-26 MED ORDER — VITAMIN C 500 MG PO TABS
1000.0000 mg | ORAL_TABLET | Freq: Every day | ORAL | Status: DC
  Administered 2014-12-26 – 2014-12-28 (×3): 1000 mg via ORAL
  Filled 2014-12-26 (×3): qty 2

## 2014-12-26 MED ORDER — LEVOFLOXACIN IN D5W 750 MG/150ML IV SOLN
750.0000 mg | Freq: Once | INTRAVENOUS | Status: AC
  Administered 2014-12-26: 750 mg via INTRAVENOUS
  Filled 2014-12-26: qty 150

## 2014-12-26 MED ORDER — VITAMIN D 1000 UNITS PO TABS
1000.0000 [IU] | ORAL_TABLET | Freq: Every day | ORAL | Status: DC
  Administered 2014-12-26 – 2014-12-28 (×3): 1000 [IU] via ORAL
  Filled 2014-12-26 (×3): qty 1

## 2014-12-26 MED ORDER — PRAVASTATIN SODIUM 10 MG PO TABS
20.0000 mg | ORAL_TABLET | Freq: Every day | ORAL | Status: DC
  Administered 2014-12-26 – 2014-12-27 (×2): 20 mg via ORAL
  Filled 2014-12-26 (×2): qty 2

## 2014-12-26 MED ORDER — ACETAMINOPHEN 325 MG PO TABS
650.0000 mg | ORAL_TABLET | Freq: Four times a day (QID) | ORAL | Status: DC | PRN
  Administered 2014-12-26 (×2): 650 mg via ORAL
  Filled 2014-12-26 (×2): qty 2

## 2014-12-26 MED ORDER — LEVOFLOXACIN IN D5W 750 MG/150ML IV SOLN
750.0000 mg | INTRAVENOUS | Status: DC
  Administered 2014-12-26 – 2014-12-27 (×2): 750 mg via INTRAVENOUS
  Filled 2014-12-26 (×2): qty 150

## 2014-12-26 MED ORDER — ADULT MULTIVITAMIN W/MINERALS CH
1.0000 | ORAL_TABLET | Freq: Every day | ORAL | Status: DC
  Administered 2014-12-26 – 2014-12-28 (×3): 1 via ORAL
  Filled 2014-12-26 (×3): qty 1

## 2014-12-26 MED ORDER — SODIUM CHLORIDE 0.9 % IV BOLUS (SEPSIS)
1000.0000 mL | Freq: Once | INTRAVENOUS | Status: AC
  Administered 2014-12-26: 1000 mL via INTRAVENOUS

## 2014-12-26 MED ORDER — LEVOFLOXACIN 750 MG PO TABS: 750.0000 mg | ORAL_TABLET | Freq: Every day | ORAL | Status: DC

## 2014-12-26 MED ORDER — ONDANSETRON HCL 4 MG/2ML IJ SOLN
4.0000 mg | Freq: Four times a day (QID) | INTRAMUSCULAR | Status: DC | PRN
  Administered 2014-12-26 – 2014-12-27 (×2): 4 mg via INTRAVENOUS
  Filled 2014-12-26 (×2): qty 2

## 2014-12-26 MED ORDER — LISINOPRIL 10 MG PO TABS
40.0000 mg | ORAL_TABLET | Freq: Every day | ORAL | Status: DC
  Administered 2014-12-26 – 2014-12-28 (×3): 40 mg via ORAL
  Filled 2014-12-26 (×3): qty 4

## 2014-12-26 MED ORDER — MELOXICAM 7.5 MG PO TABS
7.5000 mg | ORAL_TABLET | Freq: Every day | ORAL | Status: DC
  Administered 2014-12-26 – 2014-12-27 (×2): 7.5 mg via ORAL
  Filled 2014-12-26 (×5): qty 1

## 2014-12-26 MED ORDER — VITAMIN B-12 1000 MCG PO TABS
1000.0000 ug | ORAL_TABLET | Freq: Every day | ORAL | Status: DC
  Administered 2014-12-26 – 2014-12-28 (×3): 1000 ug via ORAL
  Filled 2014-12-26 (×3): qty 1

## 2014-12-26 MED ORDER — SERTRALINE HCL 50 MG PO TABS
50.0000 mg | ORAL_TABLET | Freq: Every day | ORAL | Status: DC
Start: 2014-12-26 — End: 2014-12-28
  Administered 2014-12-26 – 2014-12-28 (×3): 50 mg via ORAL
  Filled 2014-12-26 (×3): qty 1

## 2014-12-26 MED ORDER — SODIUM CHLORIDE 0.9 % IV BOLUS (SEPSIS)
500.0000 mL | Freq: Once | INTRAVENOUS | Status: AC
  Administered 2014-12-26: 500 mL via INTRAVENOUS

## 2014-12-26 MED ORDER — ALBUTEROL SULFATE (2.5 MG/3ML) 0.083% IN NEBU
2.5000 mg | INHALATION_SOLUTION | Freq: Four times a day (QID) | RESPIRATORY_TRACT | Status: DC
  Administered 2014-12-26: 2.5 mg via RESPIRATORY_TRACT
  Filled 2014-12-26: qty 3

## 2014-12-26 NOTE — ED Provider Notes (Signed)
CSN: [id number]     Arrival date & time 12/26/14  0101 History   First MD Initiated Contact with Patient 12/26/14 0119     Chief Complaint  Patient presents with  . Influenza   Patient is a 74 y.o. female presenting with flu symptoms. The history is provided by the patient.  Influenza Presenting symptoms: cough, fatigue, fever, headache, myalgias, nausea, sore throat and vomiting   Presenting symptoms: no diarrhea   Cough:    Cough characteristics:  Productive   Sputum characteristics:  Yellow   Severity:  Moderate   Onset quality:  Gradual   Duration:  4 days   Timing:  Intermittent   Progression:  Worsening   Chronicity:  New Fever:    Timing:  Constant   Max temp PTA (F):  102   Progression:  Improving Vomiting:    Quality:  Unable to specify   Severity:  Moderate   Timing:  Intermittent   Progression:  Unchanged Severity:  Moderate Onset quality:  Gradual Duration:  1 day Progression:  Unchanged Chronicity:  New Relieved by:  None tried Worsened by:  Nothing tried Associated symptoms: chills   Patient reports cough for 4 days She reports she started vomiting over the past day No diarrhea She reports fever/chills and fatigue She denies hemoptysis She has no history of chronic lung disease +sick contacts at home   Social History - nonsmoker  Past Medical History  Diagnosis Date  . Allergy   . Diabetes mellitus   . Hypertension   . Hyperlipidemia   . Arthritis   . Migraines   . 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 Tubal ligation    . Cholecystectomy     Family History  Problem Relation Age of Onset  . Arthritis      fhx  . Hyperlipidemia      fhx  . Hypertension      fhx  . Diabetes      fhx  . Stroke      fhx   History  Substance Use Topics  . Smoking status: Never Smoker   . Smokeless tobacco: Not on file  . Alcohol Use: No   OB History    No data available      Review of Systems  Constitutional: Positive for fever, chills and fatigue.  HENT: Positive for sore throat.   Respiratory: Positive for cough.   Gastrointestinal: Positive for nausea, vomiting and abdominal pain. Negative for diarrhea.  Genitourinary: Negative for dysuria.  Musculoskeletal: Positive for myalgias.  Neurological: Positive for weakness and headaches.  All other systems reviewed and are negative.     Allergies  Azithromycin and Penicillins  Home Medications   Prior to Admission medications   Medication Sig Start Date End Date Taking? Authorizing Provider  Ascorbic Acid (VITAMIN C) 1000 MG tablet Take 1,000 mg by mouth daily.    Historical Provider, MD  aspirin EC 81 MG tablet Take 81 mg by mouth daily.      Historical Provider, MD  Calcium Carbonate (CALCIUM 600 PO) Take 600-900 mg by mouth daily. Take one tablet by mouth every morning and take one and one-half tablet by mouth at bedtime     Historical Provider, MD  carvedilol (COREG) 6.25 MG tablet TAKE ONE TABLET BY MOUTH TWICE DAILY 10/04/14   [name], MD  cholecalciferol (VITAMIN D) 1000 UNITS tablet Take  1,000 Units by mouth daily.      Historical Provider, MD  Cyanocobalamin (VITAMIN B-12) 1000 MCG SUBL Place 1,000 mcg under the tongue daily.      Historical Provider, MD  glipiZIDE (GLUCOTROL XL) 2.5 MG 24 hr tablet TAKE ONE TABLET BY MOUTH ONCE DAILY 09/28/14   [name], MD  hydrochlorothiazide (HYDRODIURIL) 25 MG tablet TAKE ONE TABLET BY MOUTH ONCE DAILY 04/08/14   [name], MD  lisinopril (PRINIVIL,ZESTRIL) 40 MG tablet Take 1 tablet (40 mg total) by mouth daily. 12/06/12   [name], MD  lovastatin (MEVACOR) 20 MG tablet TAKE TWO TABLETS BY MOUTH AT BEDTIME 01/12/14   [name], MD  meloxicam (MOBIC) 15 MG tablet TAKE ONE TABLET BY MOUTH ONCE DAILY AS NEEDED 06/23/14   [name], MD  metFORMIN (GLUCOPHAGE) 500 MG tablet TAKE TWO TABLETS BY MOUTH IN THE MORNING  AND ONE IN THE EVENING 04/27/14   [name], MD  Multiple Vitamin (MULTIVITAMIN) tablet Take 1 tablet by mouth daily.      Historical Provider, MD  NIFEdipine (PROCARDIA-XL/ADALAT CC) 30 MG 24 hr tablet Take 1 tablet (30 mg total) by mouth daily. 04/03/14   [name], MD  sertraline (ZOLOFT) 50 MG tablet TAKE ONE TABLET BY MOUTH ONCE DAILY 08/20/14   [name], MD   BP 173/75 mmHg  Pulse 95  Temp(Src) 99.4 F (37.4 C) (Oral)  Resp 20  Ht 5\' 5"  (1.651 m)  Wt 214 lb (97.07 kg)  BMI 35.61 kg/m2  SpO2 93% Physical Exam CONSTITUTIONAL: Well developed/well nourished HEAD: Normocephalic/atraumatic EYES: EOMI/PERRL ENMT: Mucous membranes moist, nasal congestion.  Uvula midline without erythema NECK: supple no meningeal signs SPINE/BACK:entire spine nontender CV: S1/S2 noted, no murmurs/rubs/gallops noted LUNGS: scattered wheeze noted.  Crackles noted in left base.  Mild tachypnea noted ABDOMEN: soft, nontender, no rebound or guarding, bowel sounds noted throughout abdomen GU:no cva tenderness NEURO: Pt is awake/alert/appropriate, moves all extremitiesx4.  No facial droop.   EXTREMITIES: pulses normal/equal, full ROM, no LE edema is noted SKIN: warm, color normal PSYCH: no abnormalities of mood noted, alert and oriented to situation  ED Course  Procedures  Labs Review Labs Reviewed  BASIC METABOLIC PANEL - Abnormal; Notable for the following:    Glucose, Bld 236 (*)    Creatinine, Ser 1.21 (*)    Calcium 7.7 (*)    GFR calc non Af Amer 44 (*)    GFR calc Af Amer 51 (*)    All other components within normal limits  CBC WITH DIFFERENTIAL/PLATELET - Abnormal; Notable for the following:    RBC 3.43 (*)    Hemoglobin 10.4 (*)    HCT 30.9 (*)    All other components within normal limits  INFLUENZA PANEL BY PCR (TYPE A & B, H1N1)    Imaging Review Dg Chest Portable 1 View  12/26/2014   CLINICAL DATA:  Productive cough, nausea vomiting  EXAM: PORTABLE CHEST - 1  VIEW  COMPARISON:  11/11/2012  FINDINGS: A single AP portable view of the chest demonstrates no focal airspace consolidation or alveolar edema. The lungs are grossly clear. There is no large effusion or pneumothorax. There is unchanged cardiomegaly. Cardiac and mediastinal contours are otherwise unremarkable.  IMPRESSION: No active disease.   Electronically Signed   By: Andreas Newport M.D.   On: 12/26/2014 01:42     EKG Interpretation   Date/Time:  Saturday December 26 2014 01:21:15 EST Ventricular Rate:  93 PR Interval:  162 QRS Duration: 100 QT Interval:  392 QTC Calculation: 488 R Axis:   43 Text Interpretation:  Sinus rhythm Probable anteroseptal infarct, old No  significant change since last tracing Confirmed by Christy Gentles  MD, Sahirah Rudell  [phone]) on 12/26/2014 1:28:03 AM     Medications  albuterol (PROVENTIL) (2.5 MG/3ML) 0.083% nebulizer solution 5 mg (5 mg Nebulization Given 12/26/14 0200)  sodium chloride 0.9 % bolus 1,000 mL (0 mLs Intravenous Stopped 12/26/14 0235)  levofloxacin (LEVAQUIN) IVPB 750 mg (0 mg Intravenous Stopped 12/26/14 0412)  sodium chloride 0.9 % bolus 500 mL (500 mLs Intravenous New Bag/Given 12/26/14 0236)  acetaminophen (TYLENOL) tablet 650 mg (650 mg Oral Given 12/26/14 0413)    MDM  2:37 AM Pt with cough/fever and now with vomiting Despite negative CXR, she has unilateral crackles - given presence of fever/cough will start on levaquin for presumed pneumonia \\4 :16 AM  PT IMPROVED LUNG SOUND HAVE CLEARED SHE AMBULATED WITHOUT DYSPNEA AND WITHOUT HYPOXIA (PULSE OX 95%) SHE WOULD LIKE TO GO HOME CLINICALLY SHE IS IMPROVED WILL TREAT FOR PRESUMED PNEUMONIA ADVISED THAT LEVAQUIN CAN LOWER GLUCOSE WHILE TAKING GLIPIZIDES 4:18 AM PT NOW REPORTS SHE FEELS TOO WEAK TO GO HOME WILL CALL FOR ADMISSION 4:30 AM D/w dr Orvan Falconer, will evaluate for admission  Final diagnoses:  Dehydration  Hyperglycemia  CAP (community acquired pneumonia)    Nursing notes  including past medical history and social history reviewed and considered in documentation xrays/imaging reviewed by myself and considered during evaluation Labs/vital reviewed myself and considered during evaluation     Sharyon Cable, MD 12/26/14 [phone]

## 2014-12-26 NOTE — Progress Notes (Addendum)
Patient ID: Brenda Patterson, female   DOB: 05-01-1941, 74 y.o.   MRN: [id number] [facility] PROGRESS NOTE  Brenda Patterson [id number] DOB: 11-17-1940 DOA: 12/26/2014 PCP: [name], MD  Brief narrative:    Addendum to admission note done 12/26/2014. 74 y.o. female with past medical history of hypertension, diabetes, dyslipidemia, migraine headaches, s/p gallbladder carcinoid tumor removal, who presented to [facility] ED with flu like symptoms for 3 days prior to this admission. She reported mild productive cough, SOB, fever and weakness. On admission, patient was hemodynamically stable. She had low grade fever of 99.4 F, oxygen saturation 93% with [place] oxygen support. Blood work revealed hemoglobin of 10.4, creatinine 1.21. CXR showed no acute cardiopulmonary process. She was given nebulizer treatment in ED and started on Levaquin and admitted for further evaluation.   Assessment/Plan:    Principal Problem: Acute respiratory failure with hypoxia / PNA (pneumonia) - pt admitted with low grade fever, cough and shortness of breath although no evidence of pneumonia on CXR we empirically started Levaquin - influenza panel is negative - respiratory status is stable at this time  Active Problems: Acute renal failure - likely due to acute illness, possible pneumonia versus lisinopril. Lisinopril still ordered and creatinine better this am. - Will continue lisinopril unless renal function worsens. - creatinine improving with IV fluidis    Type 2 diabetes mellitus, controlled - resumed glipizide    Essential hypertension - resumed home meds   DVT Prophylaxis  - heparin subQ ordered    Code Status: Full.  Family Communication:  plan of care discussed with the patient Disposition Plan: feels weak and tired, will continue IV Levaquin for next 24 hours, hopefully D/C in next 24 hours   IV access:  Peripheral IV  Procedures and diagnostic studies:    Dg Chest Portable 1 View  12/26/2014  No active disease.     Medical Consultants:  None   Other Consultants:  None   IAnti-Infectives:   Levaquin 12/26/2014 -->   Brenda Lenz, MD  [facility] Pager [phone]  If 7PM-7AM, please contact night-coverage [email or web address] Password [facility] 12/26/2014, 10:39 AM   LOS: 0 days    HPI/Subjective: No acute overnight events.  Objective: Filed Vitals:   12/26/14 0107 12/26/14 0416 12/26/14 0554 12/26/14 0729  BP: 173/75 152/78 149/57   Pulse: 95 98 79   Temp: 99.4 F (37.4 C) 98.4 F (36.9 C) 98.6 F (37 C)   TempSrc: Oral Oral Oral   Resp: 20 28 22    Height: 5\' 5"  (1.651 m)     Weight: 97.07 kg (214 lb)  97.523 kg (215 lb)   SpO2: 93% 99% 96% 95%    Intake/Output Summary (Last 24 hours) at 12/26/14 1039 Last data filed at 12/26/14 0444  Gross per 24 hour  Intake   1500 ml  Output      0 ml  Net   1500 ml    Exam:   General:  Pt is alert, follows commands appropriately, not in acute distress  Cardiovascular: Regular rate and rhythm, S1/S2, no murmurs  Respiratory: Clear to auscultation bilaterally, no wheezing, no crackles, no rhonchi  Abdomen: Soft, non tender, non distended, bowel sounds present  Extremities: No edema, pulses DP and PT palpable bilaterally  Neuro: Grossly nonfocal  Data Reviewed: Basic Metabolic Panel:  Recent Labs Lab 12/26/14 0205 12/26/14 0632  NA 135 137  K 3.7 3.8  CL 106 106  CO2 24 26  GLUCOSE 236* 177*  BUN  18 18  CREATININE 1.21* 1.19*  CALCIUM 7.7* 7.8*   Liver Function Tests:  Recent Labs Lab 12/26/14 0632  AST 30  ALT 23  ALKPHOS 60  BILITOT 0.5  PROT 6.0  ALBUMIN 2.9*   No results for input(s): LIPASE, AMYLASE in the last 168 hours. No results for input(s): AMMONIA in the last 168 hours. CBC:  Recent Labs Lab 12/26/14 0205 12/26/14 0632  WBC 9.8 8.0  NEUTROABS 6.5  --   HGB 10.4* 9.9*  HCT 30.9* 30.0*  MCV 90.1 90.4  PLT 165 158   Cardiac Enzymes: No results for  input(s): CKTOTAL, CKMB, CKMBINDEX, TROPONINI in the last 168 hours. BNP: Invalid input(s): POCBNP CBG: No results for input(s): GLUCAP in the last 168 hours.  No results found for this or any previous visit (from the past 240 hour(s)).   Scheduled Meds: . albuterol  2.5 mg Nebulization Q6H  . aspirin EC  81 mg Oral Daily  . calcium carbonate  2 tablet Oral Q supper  . calcium carbonate  1 tablet Oral Q breakfast  . carvedilol  6.25 mg Oral BID  . cholecalciferol  1,000 Units Oral Daily  . glipiZIDE  2.5 mg Oral Q breakfast  . heparin  5,000 Units Subcutaneous 3 times per day  . levofloxacin (LEVAQUIN) IV  750 mg Intravenous Q24H  . lisinopril  40 mg Oral Daily  . meloxicam  7.5 mg Oral Daily  . multivitamin with minerals  1 tablet Oral Daily  . NIFEdipine  30 mg Oral Daily  . pravastatin  20 mg Oral q1800  . sertraline  50 mg Oral Daily  . vitamin B-12  1,000 mcg Oral Daily  . vitamin C  1,000 mg Oral Daily   Continuous Infusions: . sodium chloride 50 mL/hr at 12/26/14 0602

## 2014-12-26 NOTE — ED Notes (Signed)
Onset 4 days, vomiting, nausea, denies diarrhea, fever, cough with yellow sputum, generalized body aches, husband has been sick as well

## 2014-12-26 NOTE — ED Notes (Signed)
EMS gave pt  Motrin 800mg  and IV Zofran 4mg  in route

## 2014-12-26 NOTE — H&P (Signed)
[facility] History and Physical  GLENNICE BLEACHER [id number] DOB: [birth year]    PCP:   [name], MD   Chief Complaint: coughs, feverish, nausea, vomiting, and weakness.  HPI: STEPAHNIE Patterson is an 74 y.o. female with hx of DM, HTN, HLD, s/p gallbladder carcinoid tumor removal, migraine, arthritis, presented to the ER with 3 days hx of flu like symptoms.  She has mild productive coughs, SOB, having fever, and weakness.  She lives with her husband, who has similar illness, but no GI symptoms like she does.  She had no distant travel.  Evaluation in the ER included a negative influenza test, clear CXR, with no leukocytosis, Hb of 10 grams per dL (unclear baseline), BS of 236 and Cr of 1.21, along with calcium of 7.7, unclear recent albumin. She was given neb and IV Levaquin, and was going to be discharged, but she felt too weak to go home, therefore, hospitalist was asked to admit her for likely community acquired pneumonia.  Rewiew of Systems:  Constitutional: Negative for malaise, fever and chills. No significant weight loss or weight gain Eyes: Negative for eye pain, redness and discharge, diplopia, visual changes, or flashes of light. ENMT: Negative for ear pain, hoarseness, nasal congestion, sinus pressure and sore throat. No headaches; tinnitus, drooling, or problem swallowing. Cardiovascular: Negative for chest pain, palpitations, diaphoresis, and peripheral edema. ; No orthopnea, PND Respiratory: Negative for , hemoptysis, wheezing and stridor. No pleuritic chestpain. Gastrointestinal: Negative for nausea, vomiting, diarrhea, constipation, abdominal pain, melena, blood in stool, hematemesis, jaundice and rectal bleeding.    Genitourinary: Negative for frequency, dysuria, incontinence,flank pain and hematuria; Musculoskeletal: Negative for back pain and neck pain. Negative for swelling and trauma.;  Skin: . Negative for pruritus, rash, abrasions, bruising and skin lesion.;  ulcerations Neuro: Negative for headache, lightheadedness and neck stiffness. Negative for weakness, altered level of consciousness , altered mental status, extremity weakness, burning feet, involuntary movement, seizure and syncope.  Psych: negative for anxiety, depression, insomnia, tearfulness, panic attacks, hallucinations, paranoia, suicidal or homicidal ideation   Past Medical History  Diagnosis Date  . Allergy   . Diabetes mellitus   . Hypertension   . Hyperlipidemia   . Arthritis   . Migraines   . 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 Tubal ligation    . Cholecystectomy      Medications:  HOME MEDS: Prior to Admission medications   Medication Sig Start Date End Date Taking? Authorizing Provider  Ascorbic Acid (VITAMIN C) 1000 MG tablet Take 1,000 mg by mouth daily.    Historical Provider, MD  aspirin EC 81 MG tablet Take 81 mg by mouth daily.      Historical Provider, MD  Calcium Carbonate (CALCIUM 600 PO) Take 600-900 mg by mouth daily. Take one tablet by mouth every morning and take one and one-half tablet by mouth at bedtime     Historical Provider, MD  carvedilol (COREG) 6.25 MG tablet TAKE ONE TABLET BY MOUTH TWICE DAILY 10/04/14   [name], MD  cholecalciferol (VITAMIN D) 1000 UNITS tablet Take 1,000 Units by mouth daily.      Historical Provider, MD  Cyanocobalamin (VITAMIN B-12) 1000 MCG SUBL Place 1,000 mcg under the tongue daily.      Historical Provider, MD  glipiZIDE (GLUCOTROL XL) 2.5 MG 24 hr tablet TAKE ONE TABLET BY MOUTH ONCE DAILY 09/28/14  [name], MD  hydrochlorothiazide (HYDRODIURIL) 25 MG tablet TAKE ONE TABLET BY MOUTH ONCE DAILY 04/08/14   [name], MD  levofloxacin (LEVAQUIN) 750 MG tablet Take 1 tablet (750 mg total) by mouth daily. X 6 days 12/26/14 01/01/15  Sharyon Cable, MD  lisinopril (PRINIVIL,ZESTRIL) 40 MG tablet Take 1 tablet (40 mg  total) by mouth daily. 12/06/12   [name], MD  lovastatin (MEVACOR) 20 MG tablet TAKE TWO TABLETS BY MOUTH AT BEDTIME 01/12/14   [name], MD  meloxicam (MOBIC) 15 MG tablet TAKE ONE TABLET BY MOUTH ONCE DAILY AS NEEDED 06/23/14   [name], MD  metFORMIN (GLUCOPHAGE) 500 MG tablet TAKE TWO TABLETS BY MOUTH IN THE MORNING AND ONE IN THE EVENING 04/27/14   [name], MD  Multiple Vitamin (MULTIVITAMIN) tablet Take 1 tablet by mouth daily.      Historical Provider, MD  NIFEdipine (PROCARDIA-XL/ADALAT CC) 30 MG 24 hr tablet Take 1 tablet (30 mg total) by mouth daily. 04/03/14   [name], MD  sertraline (ZOLOFT) 50 MG tablet TAKE ONE TABLET BY MOUTH ONCE DAILY 08/20/14   [name], MD     Allergies:  Allergies  Allergen Reactions  . Azithromycin Nausea Only  . Penicillins Rash    Social History:   reports that she has never smoked. She does not have any smokeless tobacco history on file. She reports that she does not drink alcohol or use illicit drugs.  Family History: Family History  Problem Relation Age of Onset  . Arthritis      fhx  . Hyperlipidemia      fhx  . Hypertension      fhx  . Diabetes      fhx  . Stroke      fhx     Physical Exam: Filed Vitals:   12/26/14 0107 12/26/14 0416  BP: 173/75 152/78  Pulse: 95 98  Temp: 99.4 F (37.4 C) 98.4 F (36.9 C)  TempSrc: Oral Oral  Resp: 20 28  Height: 5\' 5"  (1.651 m)   Weight: 97.07 kg (214 lb)   SpO2: 93% 99%   Blood pressure 152/78, pulse 98, temperature 98.4 F (36.9 C), temperature source Oral, resp. rate 28, height 5\' 5"  (1.651 m), weight 97.07 kg (214 lb), SpO2 99 %.  GEN:  Pleasant  patient lying in the stretcher in no acute distress; cooperative with exam. PSYCH:  alert and oriented x4; does not appear anxious or depressed; affect is appropriate. HEENT: Mucous membranes pink and anicteric; PERRLA; EOM intact; no cervical lymphadenopathy nor thyromegaly or carotid  bruit; no JVD; There were no stridor. Neck is very supple. Breasts:: Not examined CHEST WALL: No tenderness CHEST: Normal respiration, mild exp wheezes, left lung with crackles.  HEART: Regular rate and rhythm.  There are no murmur, rub, or gallops.   BACK: No kyphosis or scoliosis; no CVA tenderness ABDOMEN: soft and non-tender; no masses, no organomegaly, normal abdominal bowel sounds; no pannus; no intertriginous candida. There is no rebound and no distention. Rectal Exam: Not done EXTREMITIES: No bone or joint deformity; age-appropriate arthropathy of the hands and knees; no edema; no ulcerations.  There is no calf tenderness. Genitalia: not examined PULSES: 2+ and symmetric SKIN: Normal hydration no rash or ulceration CNS: Cranial nerves 2-12 grossly intact no focal lateralizing neurologic deficit.  Speech is fluent; uvula elevated with phonation, facial symmetry and tongue midline. DTR are normal bilaterally, cerebella exam is  intact, barbinski is negative and strengths are equaled bilaterally.  No sensory loss.   Labs on Admission:  Basic Metabolic Panel:  Recent Labs Lab 12/26/14 0205  NA 135  K 3.7  CL 106  CO2 24  GLUCOSE 236*  BUN 18  CREATININE 1.21*  CALCIUM 7.7*   Liver Function Tests: CBC:  Recent Labs Lab 12/26/14 0205  WBC 9.8  NEUTROABS 6.5  HGB 10.4*  HCT 30.9*  MCV 90.1  PLT 165    Radiological Exams on Admission: Dg Chest Portable 1 View  12/26/2014   CLINICAL DATA:  Productive cough, nausea vomiting  EXAM: PORTABLE CHEST - 1 VIEW  COMPARISON:  11/11/2012  FINDINGS: A single [facility] portable view of the chest demonstrates no focal airspace consolidation or alveolar edema. The lungs are grossly clear. There is no large effusion or pneumothorax. There is unchanged cardiomegaly. Cardiac and mediastinal contours are otherwise unremarkable.  IMPRESSION: No active disease.   Electronically Signed   By: Andreas Newport M.D.   On: 12/26/2014 01:42    Assessment/Plan Present on Admission:  . PNA (pneumonia) . Obesity (BMI 30-39.9) . Essential hypertension Possible hypocalcemia Anemia.  PLAN: i also suspect she has pneumonia.  Although her CXR showed no infiltrate, and she has no leukocytosis,  her coughs, left lung crackle on exam, flu like symptoms, are highly suggestive of CAP.  Agreed with IV Levaquin and will continue with this.  She will receive nebs and a judicious IVF also.  For her DM, will continue her meds, hold metformin for now.  Will get ionized calcium and supplement if it is low.  For her anemia, will get anemia panel and obtain FOBT.  She is stable, full code, and will be admitted to [facility] service.   Other plans as per orders.  Code Status: FULL Haskel Khan, MD. [facility] Pager [phone] 7pm to 7am.  12/26/2014, 4:55 AM

## 2014-12-26 NOTE — ED Notes (Signed)
Pt ambulated around nurses station, O2 sats remained 95%.

## 2014-12-26 NOTE — ED Notes (Signed)
Pt states after walking now she feels too weak to go home & wanting to stay. EDP notified.

## 2014-12-27 DIAGNOSIS — N183 Chronic kidney disease, stage 3 (moderate): Secondary | ICD-10-CM

## 2014-12-27 LAB — FOLATE: Folate: >20 ng/mL

## 2014-12-27 LAB — IRON AND TIBC
Iron: 12 ug/dL — ABNORMAL LOW (ref 42–145)
Saturation Ratios: 5 % — ABNORMAL LOW (ref 20–55)
TIBC: 252 ug/dL (ref 250–470)
UIBC: 240 ug/dL (ref 125–400)

## 2014-12-27 LAB — VITAMIN B12: Vitamin B-12: 446 pg/mL (ref 211–911)

## 2014-12-27 LAB — FERRITIN: Ferritin: 78 ng/mL (ref 10–291)

## 2014-12-27 LAB — GLUCOSE, CAPILLARY: Glucose-Capillary: 146 mg/dL — ABNORMAL HIGH (ref 70–99)

## 2014-12-27 LAB — CALCIUM, IONIZED: Calcium, Ion: 1.11 mmol/L — ABNORMAL LOW (ref 1.12–1.32)

## 2014-12-27 MED ORDER — HYDRALAZINE HCL 20 MG/ML IJ SOLN
10.0000 mg | Freq: Four times a day (QID) | INTRAMUSCULAR | Status: DC | PRN
  Administered 2014-12-27 – 2014-12-28 (×3): 10 mg via INTRAVENOUS
  Filled 2014-12-27 (×3): qty 1

## 2014-12-27 MED ORDER — PROMETHAZINE HCL 25 MG/ML IJ SOLN
12.5000 mg | Freq: Four times a day (QID) | INTRAMUSCULAR | Status: DC | PRN
  Administered 2014-12-27 – 2014-12-28 (×2): 12.5 mg via INTRAVENOUS
  Filled 2014-12-27 (×2): qty 1

## 2014-12-27 NOTE — Progress Notes (Signed)
Pt c/o nausea and weakness this am. Checked CBG, which was 147. Administered Zofran 4mg  IV. Will continue to monitor. Bed is in lowest position and call bell is within reach.

## 2014-12-27 NOTE — Progress Notes (Signed)
Pt c/o nausea and weakness this am. Zofran administered IV. Dr Maudie Mercury notified.  0630 Nausea not relieved. BP is 213/86 HR 83. Notified Dr Maudie Mercury of these findings. Dr Maudie Mercury ordered Phenergan and Apresoline. Will administer as soon as available. Pt stated, "I feel this way after I get that Heparin shot". Dr Maudie Mercury aware of this. Will pass this on as well. Bed remains in lowest position and call bell is within reach.

## 2014-12-27 NOTE — Progress Notes (Signed)
Patient ID: CONSEPCION ZENI, female   DOB: October 17, 1941, 74 y.o.   MRN: [id number] [facility] PROGRESS NOTE  JADYN BUJAK [id number] DOB: 1941-02-15 DOA: 12/26/2014 PCP: [name], MD  Brief narrative:    74 y.o. female with past medical history of hypertension, diabetes, dyslipidemia, migraine headaches, s/p gallbladder carcinoid tumor removal, who presented to [facility] ED with flu like symptoms for 3 days prior to this admission. She reported mild productive cough, SOB, fever and weakness. On admission, patient was hemodynamically stable. She had low grade fever of 99.4 F, oxygen saturation 93% with [place] oxygen support. Blood work revealed hemoglobin of 10.4, creatinine 1.21. CXR showed no acute cardiopulmonary process. She was given nebulizer treatment in ED and started on Levaquin and admitted for further evaluation.  Assessment/Plan:     Principal Problem: Acute respiratory failure with hypoxia / PNA (pneumonia) - pt admitted with low grade fever, cough and shortness of breath and although no evidence of pneumonia on CXR we empirically started Levaquin - she feels little better but has nausea this am - influenza panel is negative  Active Problems: Chronic kidney disease, stage 3 - creatinine on this admission 1.21 which is better than 8 months ago when it was 1.3 - follow up BMP tomorrow am     Type 2 diabetes mellitus, controlled - Continue glipizide    Essential hypertension - Continue coreg, lisinopril, procardia    DVT Prophylaxis  - Changed to SCD per pt request; she feels that heparin sub Q makes her nauseous    Code Status: Full.  Family Communication:  plan of care discussed with the patient and family at the bedside  Disposition Plan: nausea this am so she felt little weak and tired so will observe for next 24 hours   IV access:  Peripheral IV  Procedures and diagnostic studies:    Dg Chest Portable 1 View 12/26/2014  No active disease.     Medical  Consultants:  None   Other Consultants:  None   IAnti-Infectives:   Levaquin 12/26/2014 -->  Leisa Lenz, MD  [facility] Pager [phone]  If 7PM-7AM, please contact night-coverage [email or web address] Password [email or web address] 12/27/2014, 5:02 PM   LOS: 1 day    HPI/Subjective: No acute overnight events.  Objective: Filed Vitals:   12/26/14 1429 12/27/14 0500 12/27/14 0904 12/27/14 1436  BP: 141/61  167/76 169/63  Pulse: 82 83 86 74  Temp: 99 F (37.2 C) 98.4 F (36.9 C)  98 F (36.7 C)  TempSrc: Oral Oral  Oral  Resp: 20 17  20   Height:      Weight:      SpO2: 96% 97%  96%    Intake/Output Summary (Last 24 hours) at 12/27/14 1702 Last data filed at 12/27/14 0907  Gross per 24 hour  Intake    600 ml  Output    100 ml  Net    500 ml    Exam:   General:  Pt is alert, not in acute distress  Cardiovascular: Regular rate and rhythm, S1/S2 (+)   Respiratory: bilaterally air entry, no wheezing   Abdomen: non tender, non distended, (+) BS   Extremities: palpable pulses, no edema   Neuro: Grossly nonfocal  Data Reviewed: Basic Metabolic Panel:  Recent Labs Lab 12/26/14 0205 12/26/14 0632  NA 135 137  K 3.7 3.8  CL 106 106  CO2 24 26  GLUCOSE 236* 177*  BUN 18 18  CREATININE 1.21* 1.19*  CALCIUM 7.7* 7.8*  Liver Function Tests:  Recent Labs Lab 12/26/14 0632  AST 30  ALT 23  ALKPHOS 60  BILITOT 0.5  PROT 6.0  ALBUMIN 2.9*   No results for input(s): LIPASE, AMYLASE in the last 168 hours. No results for input(s): AMMONIA in the last 168 hours. CBC:  Recent Labs Lab 12/26/14 0205 12/26/14 0632  WBC 9.8 8.0  NEUTROABS 6.5  --   HGB 10.4* 9.9*  HCT 30.9* 30.0*  MCV 90.1 90.4  PLT 165 158   Cardiac Enzymes: No results for input(s): CKTOTAL, CKMB, CKMBINDEX, TROPONINI in the last 168 hours. BNP: Invalid input(s): POCBNP CBG:  Recent Labs Lab 12/27/14 0549  GLUCAP 146*    No results found for this or any previous visit (from the  past 240 hour(s)).   Scheduled Meds: . aspirin EC  81 mg Oral Daily  . calcium carbonate  2 tablet Oral Q supper  . calcium carbonate  1 tablet Oral Q breakfast  . carvedilol  6.25 mg Oral BID  . cholecalciferol  1,000 Units Oral Daily  . glipiZIDE  2.5 mg Oral Q breakfast  . levofloxacin (LEVAQUIN) IV  750 mg Intravenous Q24H  . lisinopril  40 mg Oral Daily  . meloxicam  7.5 mg Oral Daily  . multivitamin with minerals  1 tablet Oral Daily  . NIFEdipine  30 mg Oral Daily  . pravastatin  20 mg Oral q1800  . sertraline  50 mg Oral Daily  . vitamin B-12  1,000 mcg Oral Daily  . vitamin C  1,000 mg Oral Daily   Continuous Infusions: . sodium chloride 50 mL/hr at 12/27/14 [phone]

## 2014-12-28 DIAGNOSIS — D638 Anemia in other chronic diseases classified elsewhere: Secondary | ICD-10-CM | POA: Insufficient documentation

## 2014-12-28 MED ORDER — HYDRALAZINE HCL 20 MG/ML IJ SOLN: 5.0000 mg | Freq: Once | INTRAMUSCULAR | Status: DC

## 2014-12-28 MED ORDER — LEVOFLOXACIN 500 MG PO TABS: 500.0000 mg | ORAL_TABLET | Freq: Every day | ORAL | Status: DC

## 2014-12-28 MED ORDER — HYDROCHLOROTHIAZIDE 25 MG PO TABS
12.5000 mg | ORAL_TABLET | Freq: Every day | ORAL | Status: DC
  Administered 2014-12-28: 12.5 mg via ORAL
  Filled 2014-12-28: qty 1

## 2014-12-28 MED ORDER — HYDROCHLOROTHIAZIDE 25 MG PO TABS: 25.0000 mg | ORAL_TABLET | Freq: Every day | ORAL | Status: DC

## 2014-12-28 NOTE — Progress Notes (Signed)
PT Cancellation Note  Patient Details Name: Brenda Patterson MRN: [id number] DOB: 1941-04-08   Cancelled Treatment:    Reason Eval/Treat Not Completed: PT screened, no needs identified, will sign off   Sable Feil 12/28/2014, 10:33 AM

## 2014-12-28 NOTE — Progress Notes (Signed)
1050 Patient d/c home this shift. D/C instructions and hard Rx given to patient. IV catheter removed from RIGHT AC, catheter tip intact, no s/s of infection noted, patient tolerated well. Patient assisted to vehicle via w/c by this Probation officer. Husband in car to pick up patient.

## 2014-12-28 NOTE — Care Management Note (Signed)
    Page 1 of 1   12/28/2014     10:44:55 AM CARE MANAGEMENT NOTE 12/28/2014  Patient:  STARLIN, PANIK   Account Number:  [id number]  Date Initiated:  12/28/2014  Documentation initiated by:  Theophilus Kinds  Subjective/Objective Assessment:   Pt admitted from home with pneumonia. Pt lives with her husband and will return home at discharge. Pt is independent with ADL's.     Action/Plan:   Pt discharged home today. Pt does not want to wait for PT eval as pt stated that she is independent with ADL's.   Anticipated DC Date:  12/28/2014   Anticipated DC Plan:  [place]  CM consult      Choice offered to / List presented to:             Status of service:  Completed, signed off Medicare Important Message given?  NA - LOS <3 / Initial given by admissions (If response is "NO", the following Medicare IM given date fields will be blank) Date Medicare IM given:   Medicare IM given by:   Date Additional Medicare IM given:   Additional Medicare IM given by:    Discharge Disposition:  HOME/SELF CARE  Per UR Regulation:    If discussed at Long Length of Stay Meetings, dates discussed:    Comments:  12/28/14 [place], RN BSN CM

## 2014-12-28 NOTE — Progress Notes (Signed)
UR chart review completed.  

## 2014-12-28 NOTE — Discharge Summary (Signed)
Physician Discharge Summary  Brenda Patterson [id number] DOB: Jun 24, 1941 DOA: 12/26/2014  PCP: [name], MD  Admit date: 12/26/2014 Discharge date: 12/28/2014  Recommendations for Outpatient Follow-up:  1. Patient will take Levaquin for 5 more days on discharge. She will follow-up with primary care physician per scheduled appointment.  Discharge Diagnoses:  Principal Problem:   PNA (pneumonia) Active Problems:   Type 2 diabetes mellitus, controlled   Essential hypertension   Obesity (BMI 30-39.9)    Discharge Condition: stable   Diet recommendation: as tolerated   History of present illness:  74 y.o. female with past medical history of hypertension, diabetes, dyslipidemia, migraine headaches, s/p gallbladder carcinoid tumor removal, who presented to [facility] ED with flu like symptoms for 3 days prior to this admission. She reported mild productive cough, SOB, fever and weakness. On admission, patient was hemodynamically stable. She had low grade fever of 99.4 F, oxygen saturation 93% with [place] oxygen support. Blood work revealed hemoglobin of 10.4, creatinine 1.21. CXR showed no acute cardiopulmonary process. She was given nebulizer treatment in ED and started on Levaquin and admitted for further evaluation.  Assessment/Plan:     Principal Problem: Acute respiratory failure with hypoxia / PNA (pneumonia) - Pt admitted with low grade fever, cough and shortness of breath and although no evidence of pneumonia on CXR we empirically started Levaquin - Patient will continue Levaquin for 5 more days on discharge. - Influenza panel was negative on this admission. - Respiratory status stable.  Active Problems: Chronic kidney disease, stage 3 - creatinine on this admission 1.21 which is better than 8 months ago when it was 1.3  Anemia of chronic disease - Secondary to history of chronic kidney disease - Hemoglobin stable at 9.9 - No indications for transfusion.  Type 2  diabetes mellitus, controlled - Continue glipizide and metformin   Essential hypertension - Continue coreg, lisinopril, procardia, HCTZ  Dyslipidemia - Continue statin therapy on discharge   DVT Prophylaxis  - Changed to SCD per pt request   Code Status: Full.  Family Communication: plan of care discussed with the patient and family at the bedside    IV access:  Peripheral IV  Procedures and diagnostic studies:   Dg Chest Portable 1 View 12/26/2014 No active disease.   Medical Consultants:  None   Other Consultants:  None  IAnti-Infectives:   Levaquin 12/26/2014 --> for 5 more days on discharge   Signed:  Leisa Lenz, MD  [facility] 12/28/2014, 9:51 AM  Pager #: [phone]   Discharge Exam: Filed Vitals:   12/28/14 0500  BP: 182/84  Pulse: 83  Temp: 98.1 F (36.7 C)  Resp: 18   Filed Vitals:   12/27/14 1436 12/27/14 2248 12/27/14 2300 12/28/14 0500  BP: 169/63 207/73 181/68 182/84  Pulse: 74 76 82 83  Temp: 98 F (36.7 C) 98.8 F (37.1 C)  98.1 F (36.7 C)  TempSrc: Oral Oral  Oral  Resp: 20 20  18   Height:      Weight:      SpO2: 96% 98%  97%    General: Pt is alert, follows commands appropriately, not in acute distress Cardiovascular: Regular rate and rhythm, S1/S2 +, no murmurs Respiratory: Clear to auscultation bilaterally, no wheezing, no crackles, no rhonchi Abdominal: Soft, non tender, non distended, bowel sounds +, no guarding Extremities: no edema, no cyanosis, pulses palpable bilaterally DP and PT Neuro: Grossly nonfocal  Discharge Instructions  Discharge Instructions    Call MD for:  difficulty  breathing, headache or visual disturbances    Complete by:  As directed      Call MD for:  redness, tenderness, or signs of infection (pain, swelling, redness, odor or green/yellow discharge around incision site)    Complete by:  As directed      Call MD for:  severe uncontrolled pain    Complete by:  As  directed      Diet - low sodium heart healthy    Complete by:  As directed      Discharge instructions    Complete by:  As directed   1. Continue Levaquin 500 mg daily for 5 days on discharge.     Increase activity slowly    Complete by:  As directed             Medication List    TAKE these medications        aspirin EC 81 MG tablet  Take 81 mg by mouth daily.     CALCIUM 600 PO  Take 600-900 mg by mouth daily. Take one tablet by mouth every morning and take one and one-half tablet by mouth at bedtime     carvedilol 6.25 MG tablet  Commonly known as:  COREG  TAKE ONE TABLET BY MOUTH TWICE DAILY     cholecalciferol 1000 UNITS tablet  Commonly known as:  VITAMIN D  Take 1,000 Units by mouth daily.     glipiZIDE 2.5 MG 24 hr tablet  Commonly known as:  GLUCOTROL XL  TAKE ONE TABLET BY MOUTH ONCE DAILY     hydrochlorothiazide 25 MG tablet  Commonly known as:  HYDRODIURIL  TAKE ONE TABLET BY MOUTH ONCE DAILY     levofloxacin 500 MG tablet  Commonly known as:  LEVAQUIN  Take 1 tablet (500 mg total) by mouth daily.     lisinopril 40 MG tablet  Commonly known as:  PRINIVIL,ZESTRIL  Take 1 tablet (40 mg total) by mouth daily.     lovastatin 20 MG tablet  Commonly known as:  MEVACOR  TAKE TWO TABLETS BY MOUTH AT BEDTIME     meloxicam 15 MG tablet  Commonly known as:  MOBIC  TAKE ONE TABLET BY MOUTH ONCE DAILY AS NEEDED     metFORMIN 500 MG tablet  Commonly known as:  GLUCOPHAGE  TAKE TWO TABLETS BY MOUTH IN THE MORNING AND ONE IN THE EVENING     multivitamin tablet  Take 1 tablet by mouth daily.     NIFEdipine 30 MG 24 hr tablet  Commonly known as:  PROCARDIA-XL/ADALAT CC  Take 1 tablet (30 mg total) by mouth daily.     sertraline 50 MG tablet  Commonly known as:  ZOLOFT  TAKE ONE TABLET BY MOUTH ONCE DAILY     Vitamin B-12 1000 MCG Subl  Place 1,000 mcg under the tongue daily.     vitamin C 1000 MG tablet  Take 1,000 mg by mouth daily.             Follow-up Information    Follow up with [name], MD. Schedule an appointment as soon as possible for a visit in 1 week.   Specialty:  Family Medicine   Why:  Follow up appt after recent hospitalization   Contact information:   [place] [address] [phone]        The results of significant diagnostics from this hospitalization (including imaging, microbiology, ancillary and laboratory) are listed below for reference.  Significant Diagnostic Studies: Dg Chest Portable 1 View  12/26/2014   CLINICAL DATA:  Productive cough, nausea vomiting  EXAM: PORTABLE CHEST - 1 VIEW  COMPARISON:  11/11/2012  FINDINGS: A single [facility] portable view of the chest demonstrates no focal airspace consolidation or alveolar edema. The lungs are grossly clear. There is no large effusion or pneumothorax. There is unchanged cardiomegaly. Cardiac and mediastinal contours are otherwise unremarkable.  IMPRESSION: No active disease.   Electronically Signed   By: Andreas Newport M.D.   On: 12/26/2014 01:42    Microbiology: No results found for this or any previous visit (from the past 240 hour(s)).   Labs: Basic Metabolic Panel:  Recent Labs Lab 12/26/14 0205 12/26/14 0632  NA 135 137  K 3.7 3.8  CL 106 106  CO2 24 26  GLUCOSE 236* 177*  BUN 18 18  CREATININE 1.21* 1.19*  CALCIUM 7.7* 7.8*   Liver Function Tests:  Recent Labs Lab 12/26/14 0632  AST 30  ALT 23  ALKPHOS 60  BILITOT 0.5  PROT 6.0  ALBUMIN 2.9*   No results for input(s): LIPASE, AMYLASE in the last 168 hours. No results for input(s): AMMONIA in the last 168 hours. CBC:  Recent Labs Lab 12/26/14 0205 12/26/14 0632  WBC 9.8 8.0  NEUTROABS 6.5  --   HGB 10.4* 9.9*  HCT 30.9* 30.0*  MCV 90.1 90.4  PLT 165 158   Cardiac Enzymes: No results for input(s): CKTOTAL, CKMB, CKMBINDEX, TROPONINI in the last 168 hours. BNP: BNP (last 3 results) No results for input(s): BNP in the last 8760  hours.  ProBNP (last 3 results) No results for input(s): PROBNP in the last 8760 hours.  CBG:  Recent Labs Lab 12/27/14 0549  GLUCAP 146*    Time coordinating discharge: Over 30 minutes

## 2014-12-28 NOTE — Discharge Instructions (Signed)
PLEASE CHECK YOUR BLOOD SUGAR FREQUENTLY AS YOU MAY NEED TO HOLD YOUR DIABETES MEDICINES AS THE LEVAQUIN CAN LOWER YOUR SUGAR  Pneumonia Pneumonia is an infection of the lungs.  CAUSES Pneumonia may be caused by bacteria or a virus. Usually, these infections are caused by breathing infectious particles into the lungs (respiratory tract). SIGNS AND SYMPTOMS   Cough.  Fever.  Chest pain.  Increased rate of breathing.  Wheezing.  Mucus production. DIAGNOSIS  If you have the common symptoms of pneumonia, your health care provider will typically confirm the diagnosis with a chest X-ray. The X-ray will show an abnormality in the lung (pulmonary infiltrate) if you have pneumonia. Other tests of your blood, urine, or sputum may be done to find the specific cause of your pneumonia. Your health care provider may also do tests (blood gases or pulse oximetry) to see how well your lungs are working. TREATMENT  Some forms of pneumonia may be spread to other people when you cough or sneeze. You may be asked to wear a mask before and during your exam. Pneumonia that is caused by bacteria is treated with antibiotic medicine. Pneumonia that is caused by the influenza virus may be treated with an antiviral medicine. Most other viral infections must run their course. These infections will not respond to antibiotics.  HOME CARE INSTRUCTIONS   Cough suppressants may be used if you are losing too much rest. However, coughing protects you by clearing your lungs. You should avoid using cough suppressants if you can.  Your health care provider may have prescribed medicine if he or she thinks your pneumonia is caused by bacteria or influenza. Finish your medicine even if you start to feel better.  Your health care provider may also prescribe an expectorant. This loosens the mucus to be coughed up.  Take medicines only as directed by your health care provider.  Do not smoke. Smoking is a common cause of  bronchitis and can contribute to pneumonia. If you are a smoker and continue to smoke, your cough may last several weeks after your pneumonia has cleared.  A cold steam vaporizer or humidifier in your room or home may help loosen mucus.  Coughing is often worse at night. Sleeping in a semi-upright position in a recliner or using a couple pillows under your head will help with this.  Get rest as you feel it is needed. Your body will usually let you know when you need to rest. PREVENTION A pneumococcal shot (vaccine) is available to prevent a common bacterial cause of pneumonia. This is usually suggested for:  People over 33 years old.  Patients on chemotherapy.  People with chronic lung problems, such as bronchitis or emphysema.  People with immune system problems. If you are over 65 or have a high risk condition, you may receive the pneumococcal vaccine if you have not received it before. In some countries, a routine influenza vaccine is also recommended. This vaccine can help prevent some cases of pneumonia.You may be offered the influenza vaccine as part of your care. If you smoke, it is time to quit. You may receive instructions on how to stop smoking. Your health care provider can provide medicines and counseling to help you quit. SEEK MEDICAL CARE IF: You have a fever. SEEK IMMEDIATE MEDICAL CARE IF:   Your illness becomes worse. This is especially true if you are elderly or weakened from any other disease.  You cannot control your cough with suppressants and are losing sleep.  You begin coughing up blood.  You develop pain which is getting worse or is uncontrolled with medicines.  Any of the symptoms which initially brought you in for treatment are getting worse rather than better.  You develop shortness of breath or chest pain. MAKE SURE YOU:   Understand these instructions.  Will watch your condition.  Will get help right away if you are not doing well or get  worse. Document Released: 10/23/2005 Document Revised: 03/09/2014 Document Reviewed: 01/12/2011 [facility] Patient Information 2015 [place], [place]. This information is not intended to replace advice given to you by your health care provider. Make sure you discuss any questions you have with your health care provider.

## 2015-01-05 ENCOUNTER — Ambulatory Visit (INDEPENDENT_AMBULATORY_CARE_PROVIDER_SITE_OTHER): Payer: Medicare Other | Admitting: Family Medicine

## 2015-01-05 ENCOUNTER — Encounter: Payer: Self-pay | Admitting: Family Medicine

## 2015-01-05 VITALS — BP 130/80 | HR 76 | Temp 98.1°F | Wt 211.0 lb

## 2015-01-05 DIAGNOSIS — I10 Essential (primary) hypertension: Secondary | ICD-10-CM

## 2015-01-05 DIAGNOSIS — E119 Type 2 diabetes mellitus without complications: Secondary | ICD-10-CM

## 2015-01-05 DIAGNOSIS — D649 Anemia, unspecified: Secondary | ICD-10-CM | POA: Diagnosis not present

## 2015-01-05 DIAGNOSIS — R059 Cough, unspecified: Secondary | ICD-10-CM

## 2015-01-05 DIAGNOSIS — R05 Cough: Secondary | ICD-10-CM

## 2015-01-05 LAB — CBC WITH DIFFERENTIAL/PLATELET
Basophils Absolute: 0 10*3/uL (ref 0.0–0.1)
Basophils Relative: 0.4 % (ref 0.0–3.0)
Eosinophils Absolute: 0.6 10*3/uL (ref 0.0–0.7)
Eosinophils Relative: 6.7 % — ABNORMAL HIGH (ref 0.0–5.0)
HCT: 33 % — ABNORMAL LOW (ref 36.0–46.0)
Hemoglobin: 11.2 g/dL — ABNORMAL LOW (ref 12.0–15.0)
Lymphocytes Relative: 31.6 % (ref 12.0–46.0)
Lymphs Abs: 2.8 10*3/uL (ref 0.7–4.0)
MCHC: 33.8 g/dL (ref 30.0–36.0)
MCV: 88.4 fl (ref 78.0–100.0)
Monocytes Absolute: 0.5 10*3/uL (ref 0.1–1.0)
Monocytes Relative: 5.5 % (ref 3.0–12.0)
Neutro Abs: 4.9 10*3/uL (ref 1.4–7.7)
Neutrophils Relative %: 55.8 % (ref 43.0–77.0)
Platelets: 292 10*3/uL (ref 150.0–400.0)
RBC: 3.73 Mil/uL — ABNORMAL LOW (ref 3.87–5.11)
RDW: 13 % (ref 11.5–15.5)
WBC: 8.7 10*3/uL (ref 4.0–10.5)

## 2015-01-05 MED ORDER — ONDANSETRON 4 MG PO TBDP: 4.0000 mg | ORAL_TABLET | Freq: Three times a day (TID) | ORAL | Status: DC | PRN

## 2015-01-05 NOTE — Progress Notes (Signed)
Pre visit review using our clinic review tool, if applicable. No additional management support is needed unless otherwise documented below in the visit note. 

## 2015-01-05 NOTE — Progress Notes (Signed)
   Subjective:    Patient ID: Brenda Patterson, female    DOB: [birth year], 74 y.o.   MRN: [id number]  HPI Patient seen for hospital follow-up. She was admitted on 12/26/2014 and discharged on 12/26/2014. She was admitted with low-grade fever, productive cough, shortness of breath, generalized weakness, sinus congestion and intermittent headaches. Her initial white count was normal. Chest x-ray did not show any clear infiltrate. However, she was treated empirically with Levaquin for presumed pneumonia. Influenza screen was negative. She gradually improved and was treated with 5 days of oral Levaquin at discharge.   History of chronic normocytic anemia and hemoglobin had dropped to 9.9. This may been partly dilutional. Previous hemoglobin here January 2014 12.3. Patient had further studies including B12 level which was normal. Iron saturation and iron levels were slightly low. Almost 10 years since previous colonoscopy. She's not had any melena or bloody stools. No recent upper abdominal pain.  Her blood pressure was up somewhat during hospitalization but she thinks she was resumed ceding some steroids which made exacerbated. Overall, feels tremendously improved at this time. Sinus congestive symptoms and cough have improved. No further fever.  Past Medical History  Diagnosis Date  . Allergy   . Diabetes mellitus   . Hypertension   . Hyperlipidemia   . Arthritis   . Migraines   . 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 Tubal ligation    . Cholecystectomy      reports that she has never smoked. She does not have any smokeless tobacco history on file. She reports that she does not drink alcohol or use illicit drugs. family history includes Arthritis in an other family member; Diabetes in an other family member; Hyperlipidemia in an other family member; Hypertension in an other family member; Stroke in an other family  member. Allergies  Allergen Reactions  . Azithromycin Nausea Only  . Penicillins Rash      Review of Systems  Constitutional: Negative for fatigue.  Eyes: Negative for visual disturbance.  Respiratory: Negative for cough, chest tightness, shortness of breath and wheezing.   Cardiovascular: Negative for chest pain, palpitations and leg swelling.  Gastrointestinal: Negative for abdominal pain and blood in stool.  Endocrine: Negative for polydipsia and polyuria.  Genitourinary: Negative for dysuria.  Neurological: Negative for dizziness, seizures, syncope, weakness, light-headedness and headaches.       Objective:   Physical Exam  Constitutional: She appears well-developed and well-nourished. No distress.  HENT:  Mouth/Throat: Oropharynx is clear and moist.  Cardiovascular: Normal rate and regular rhythm.   Pulmonary/Chest: Effort normal and breath sounds normal. No respiratory distress. She has no wheezes. She has no rales.  Musculoskeletal: She exhibits no edema.          Assessment & Plan:  #1 recent febrile illness. This was labeled as possible pneumonia but this was never evident from chest x-ray. She's improved at this time and near baseline following Levaquin #2 type 2 diabetes. History of fair control. Schedule for office follow-up in a month and recheck A1c then #3 normocytic anemia with slightly low iron saturation and serum iron. We discussed options such as Hemoccults. She has been almost 10 years out from previous colonoscopy and we will set up repeat GI evaluation. Repeat CBC today #4 hypertension which is stable by reading today

## 2015-01-06 ENCOUNTER — Telehealth: Payer: Self-pay | Admitting: Family Medicine

## 2015-01-06 NOTE — Telephone Encounter (Signed)
emmi emailed °

## 2015-01-18 ENCOUNTER — Other Ambulatory Visit: Payer: Self-pay | Admitting: Family Medicine

## 2015-02-02 ENCOUNTER — Other Ambulatory Visit: Payer: Self-pay | Admitting: Family Medicine

## 2015-02-16 ENCOUNTER — Other Ambulatory Visit: Payer: Self-pay | Admitting: Family Medicine

## 2015-02-17 ENCOUNTER — Telehealth: Payer: Self-pay | Admitting: Family Medicine

## 2015-02-17 NOTE — Telephone Encounter (Signed)
Erling Cruz called from [facility] wanting to see if we can change the mg and instruction for lovastatin (MEVACOR) 20 MG tablet. The Insurance won't pay for her to take 2 at bedtime but they will pay for 1 at bedtime 40mg .

## 2015-02-17 NOTE — Telephone Encounter (Signed)
OK to change to 40 mg tablet.

## 2015-02-18 MED ORDER — LOVASTATIN 40 MG PO TABS: 40.0000 mg | ORAL_TABLET | Freq: Every day | ORAL | Status: DC

## 2015-02-18 NOTE — Telephone Encounter (Signed)
Rx sent to pharmacy   

## 2015-04-07 ENCOUNTER — Other Ambulatory Visit: Payer: Self-pay | Admitting: Family Medicine

## 2015-04-13 ENCOUNTER — Telehealth: Payer: Self-pay

## 2015-04-13 MED ORDER — LISINOPRIL 40 MG PO TABS: 40.0000 mg | ORAL_TABLET | Freq: Every day | ORAL | Status: DC

## 2015-04-13 MED ORDER — CARVEDILOL 6.25 MG PO TABS: 6.2500 mg | ORAL_TABLET | Freq: Two times a day (BID) | ORAL | Status: DC

## 2015-04-13 MED ORDER — GLIPIZIDE ER 2.5 MG PO TB24: 2.5000 mg | ORAL_TABLET | Freq: Every day | ORAL | Status: DC

## 2015-04-13 NOTE — Telephone Encounter (Signed)
Rx request for:  Lisinopril 40 mg  Carvediol 6.25 mg  Glipizide 2.5 mg  Pharm: [facility]

## 2015-04-13 NOTE — Telephone Encounter (Signed)
HCTZ 25 mg  Nifedipine 30 mg

## 2015-04-14 ENCOUNTER — Ambulatory Visit (INDEPENDENT_AMBULATORY_CARE_PROVIDER_SITE_OTHER): Payer: Medicare Other | Admitting: Family Medicine

## 2015-04-14 ENCOUNTER — Encounter: Payer: Self-pay | Admitting: Family Medicine

## 2015-04-14 VITALS — BP 140/82 | HR 74 | Temp 97.9°F | Wt 212.0 lb

## 2015-04-14 DIAGNOSIS — I10 Essential (primary) hypertension: Secondary | ICD-10-CM

## 2015-04-14 DIAGNOSIS — E119 Type 2 diabetes mellitus without complications: Secondary | ICD-10-CM | POA: Diagnosis not present

## 2015-04-14 DIAGNOSIS — E785 Hyperlipidemia, unspecified: Secondary | ICD-10-CM

## 2015-04-14 LAB — HEPATIC FUNCTION PANEL
ALT: 15 U/L (ref 0–35)
AST: 14 U/L (ref 0–37)
Albumin: 3.9 g/dL (ref 3.5–5.2)
Alkaline Phosphatase: 85 U/L (ref 39–117)
Bilirubin, Direct: 0.1 mg/dL (ref 0.0–0.3)
Total Bilirubin: 0.4 mg/dL (ref 0.2–1.2)
Total Protein: 6.8 g/dL (ref 6.0–8.3)

## 2015-04-14 LAB — LIPID PANEL
Cholesterol: 168 mg/dL (ref 0–200)
HDL: 35.4 mg/dL — ABNORMAL LOW (ref >39.00)
NonHDL: 132.6
Total CHOL/HDL Ratio: 5
Triglycerides: 278 mg/dL — ABNORMAL HIGH (ref 0.0–149.0)
VLDL: 55.6 mg/dL — ABNORMAL HIGH (ref 0.0–40.0)

## 2015-04-14 LAB — BASIC METABOLIC PANEL
BUN: 26 mg/dL — ABNORMAL HIGH (ref 6–23)
CO2: 28 mEq/L (ref 19–32)
Calcium: 9.5 mg/dL (ref 8.4–10.5)
Chloride: 102 mEq/L (ref 96–112)
Creatinine, Ser: 1.32 mg/dL — ABNORMAL HIGH (ref 0.40–1.20)
GFR: 41.91 mL/min — ABNORMAL LOW (ref >60.00)
Glucose, Bld: 154 mg/dL — ABNORMAL HIGH (ref 70–99)
Potassium: 4.1 mEq/L (ref 3.5–5.1)
Sodium: 137 mEq/L (ref 135–145)

## 2015-04-14 LAB — HM DIABETES EYE EXAM

## 2015-04-14 LAB — LDL CHOLESTEROL, DIRECT: Direct LDL: 99 mg/dL

## 2015-04-14 LAB — HM DIABETES FOOT EXAM

## 2015-04-14 LAB — HEMOGLOBIN A1C: Hgb A1c MFr Bld: 6.8 % — ABNORMAL HIGH (ref 4.6–6.5)

## 2015-04-14 NOTE — Progress Notes (Signed)
   Subjective:    Patient ID: Brenda Patterson, female    DOB: [birth year], 74 y.o.   MRN: [id number]  HPI Medical follow-up  Type 2 diabetes. History of good control. Last A1c 7.0%. Does not monitor blood sugars regularly at home. No recent hypoglycemia. No polyuria or polydipsia. Schedule eye exam in a couple of weeks. She remains on ACE inhibitor  Hyperlipidemia. Treated with Mevacor. No myalgias. No history of CAD. Remains on baby aspirin. Compliant with therapy.  Hypertension. Treated with combination therapy with carvedilol, HCTZ, lisinopril, and nifedipine. No headaches. No peripheral edema. No dizziness.  Past Medical History  Diagnosis Date  . Allergy   . Diabetes mellitus   . Hypertension   . Hyperlipidemia   . Arthritis   . Migraines   . 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 Tubal ligation    . Cholecystectomy      reports that she has never smoked. She does not have any smokeless tobacco history on file. She reports that she does not drink alcohol or use illicit drugs. family history includes Arthritis in an other family member; Diabetes in an other family member; Hyperlipidemia in an other family member; Hypertension in an other family member; Stroke in an other family member. Allergies  Allergen Reactions  . Azithromycin Nausea Only  . Penicillins Rash      Review of Systems  Constitutional: Negative for fatigue and unexpected weight change.  Eyes: Negative for visual disturbance.  Respiratory: Negative for cough, chest tightness, shortness of breath and wheezing.   Cardiovascular: Negative for chest pain, palpitations and leg swelling.  Endocrine: Negative for polydipsia and polyuria.  Neurological: Negative for dizziness, seizures, syncope, weakness, light-headedness and headaches.       Objective:   Physical Exam  Constitutional: She appears well-developed and well-nourished.    Neck: Neck supple. No thyromegaly present.  Cardiovascular: Normal rate and regular rhythm.  Exam reveals no gallop.   Pulmonary/Chest: Effort normal and breath sounds normal. No respiratory distress. She has no wheezes. She has no rales.  Musculoskeletal: She exhibits no edema.  Skin:  She has small abrasion distal aspect right great toe. No signs of secondary infection. Otherwise no skin lesions. Normal sensory function. Normal foot pulses          Assessment & Plan:  #1 type 2 diabetes. History of good control. Recheck A1c. Continue with regular eye checks #2 hypertension. Consistent readings less than 130/80 by home readings. Continue close monitoring #3 hyperlipidemia. Repeat lipid and hepatic panel today.

## 2015-04-14 NOTE — Progress Notes (Signed)
Pre visit review using our clinic review tool, if applicable. No additional management support is needed unless otherwise documented below in the visit note. 

## 2015-04-15 ENCOUNTER — Other Ambulatory Visit: Payer: Self-pay | Admitting: Family Medicine

## 2015-04-15 MED ORDER — LOVASTATIN 40 MG PO TABS: 40.0000 mg | ORAL_TABLET | Freq: Every day | ORAL | Status: DC

## 2015-04-15 MED ORDER — NIFEDIPINE ER 30 MG PO TB24: 30.0000 mg | ORAL_TABLET | Freq: Every day | ORAL | Status: DC

## 2015-04-15 MED ORDER — SERTRALINE HCL 50 MG PO TABS
50.0000 mg | ORAL_TABLET | Freq: Every day | ORAL | Status: DC
Start: 2015-04-15 — End: 2015-11-10

## 2015-04-15 MED ORDER — METFORMIN HCL 500 MG PO TABS: ORAL_TABLET | ORAL | Status: DC

## 2015-04-15 MED ORDER — HYDROCHLOROTHIAZIDE 25 MG PO TABS: 25.0000 mg | ORAL_TABLET | Freq: Every day | ORAL | Status: DC

## 2015-04-15 NOTE — Telephone Encounter (Signed)
Sent to [facility] for 6 months.  Pt to return in Dec 2016

## 2015-04-15 NOTE — Telephone Encounter (Signed)
Sent to the pharmacy by [facility].  Pt to return in Dec 2016.  Filled for 6 months.

## 2015-04-15 NOTE — Telephone Encounter (Signed)
HCTZ and nifedipine filled for 6 months.  Pt to return in Dec. 2016.

## 2015-04-16 ENCOUNTER — Telehealth: Payer: Self-pay | Admitting: Family Medicine

## 2015-04-16 NOTE — Telephone Encounter (Signed)
Returning call. Pt would like results of labs. Please call back on cell number.

## 2015-04-20 NOTE — Telephone Encounter (Signed)
Pt informed

## 2015-04-28 ENCOUNTER — Telehealth: Payer: Self-pay | Admitting: Family Medicine

## 2015-04-28 NOTE — Telephone Encounter (Signed)
Fluconazole 150 mg po times one dose 

## 2015-04-28 NOTE — Telephone Encounter (Signed)
Last visit 04/14/15

## 2015-04-28 NOTE — Telephone Encounter (Signed)
Pt has a yeast infection and has tried monistat otc needs diflucan call into [facility]

## 2015-04-29 MED ORDER — FLUCONAZOLE 150 MG PO TABS: 150.0000 mg | ORAL_TABLET | Freq: Once | ORAL | Status: DC

## 2015-04-29 NOTE — Telephone Encounter (Signed)
Rx sent to pharmacy   

## 2015-05-03 ENCOUNTER — Other Ambulatory Visit: Payer: Self-pay | Admitting: Family Medicine

## 2015-06-14 ENCOUNTER — Other Ambulatory Visit: Payer: Self-pay

## 2015-06-14 DIAGNOSIS — Z1231 Encounter for screening mammogram for malignant neoplasm of breast: Secondary | ICD-10-CM

## 2015-06-26 ENCOUNTER — Encounter (HOSPITAL_COMMUNITY): Payer: Self-pay | Admitting: Emergency Medicine

## 2015-06-26 ENCOUNTER — Emergency Department (HOSPITAL_COMMUNITY)
Admission: EM | Admit: 2015-06-26 | Discharge: 2015-06-26 | Disposition: A | Discharge status: Home / Self Care | Payer: Medicare Other | Attending: Emergency Medicine | Admitting: Emergency Medicine

## 2015-06-26 DIAGNOSIS — Z791 Long term (current) use of non-steroidal anti-inflammatories (NSAID): Secondary | ICD-10-CM | POA: Diagnosis not present

## 2015-06-26 DIAGNOSIS — Z79899 Other long term (current) drug therapy: Secondary | ICD-10-CM | POA: Insufficient documentation

## 2015-06-26 DIAGNOSIS — S1096XA Insect bite of unspecified part of neck, initial encounter: Secondary | ICD-10-CM | POA: Diagnosis present

## 2015-06-26 DIAGNOSIS — I10 Essential (primary) hypertension: Secondary | ICD-10-CM | POA: Insufficient documentation

## 2015-06-26 DIAGNOSIS — Y999 Unspecified external cause status: Secondary | ICD-10-CM | POA: Insufficient documentation

## 2015-06-26 DIAGNOSIS — S80862A Insect bite (nonvenomous), left lower leg, initial encounter: Secondary | ICD-10-CM | POA: Diagnosis not present

## 2015-06-26 DIAGNOSIS — E119 Type 2 diabetes mellitus without complications: Secondary | ICD-10-CM | POA: Diagnosis not present

## 2015-06-26 DIAGNOSIS — E785 Hyperlipidemia, unspecified: Secondary | ICD-10-CM | POA: Insufficient documentation

## 2015-06-26 DIAGNOSIS — M1990 Unspecified osteoarthritis, unspecified site: Secondary | ICD-10-CM | POA: Insufficient documentation

## 2015-06-26 DIAGNOSIS — R0600 Dyspnea, unspecified: Secondary | ICD-10-CM | POA: Diagnosis not present

## 2015-06-26 DIAGNOSIS — W57XXXA Bitten or stung by nonvenomous insect and other nonvenomous arthropods, initial encounter: Secondary | ICD-10-CM | POA: Insufficient documentation

## 2015-06-26 DIAGNOSIS — S60561A Insect bite (nonvenomous) of right hand, initial encounter: Secondary | ICD-10-CM | POA: Diagnosis not present

## 2015-06-26 DIAGNOSIS — Z880 Allergy status to penicillin: Secondary | ICD-10-CM | POA: Diagnosis not present

## 2015-06-26 DIAGNOSIS — Y929 Unspecified place or not applicable: Secondary | ICD-10-CM | POA: Diagnosis not present

## 2015-06-26 DIAGNOSIS — Z7982 Long term (current) use of aspirin: Secondary | ICD-10-CM | POA: Diagnosis not present

## 2015-06-26 DIAGNOSIS — Y939 Activity, unspecified: Secondary | ICD-10-CM | POA: Diagnosis not present

## 2015-06-26 DIAGNOSIS — S30861A Insect bite (nonvenomous) of abdominal wall, initial encounter: Secondary | ICD-10-CM | POA: Diagnosis not present

## 2015-06-26 DIAGNOSIS — S80861A Insect bite (nonvenomous), right lower leg, initial encounter: Secondary | ICD-10-CM | POA: Diagnosis not present

## 2015-06-26 DIAGNOSIS — T148 Other injury of unspecified body region: Secondary | ICD-10-CM | POA: Insufficient documentation

## 2015-06-26 MED ORDER — HYDROCODONE-ACETAMINOPHEN 5-325 MG PO TABS
1.0000 | ORAL_TABLET | Freq: Once | ORAL | Status: AC
  Administered 2015-06-26: 1 via ORAL
  Filled 2015-06-26: qty 1

## 2015-06-26 MED ORDER — DIPHENHYDRAMINE HCL 25 MG PO TABS: 25.0000 mg | ORAL_TABLET | Freq: Four times a day (QID) | ORAL | Status: DC | PRN

## 2015-06-26 MED ORDER — KETOROLAC TROMETHAMINE 60 MG/2ML IM SOLN
30.0000 mg | Freq: Once | INTRAMUSCULAR | Status: AC
  Administered 2015-06-26: 30 mg via INTRAMUSCULAR
  Filled 2015-06-26: qty 2

## 2015-06-26 MED ORDER — PREDNISONE 50 MG PO TABS
60.0000 mg | ORAL_TABLET | Freq: Once | ORAL | Status: AC
  Administered 2015-06-26: 60 mg via ORAL
  Filled 2015-06-26 (×2): qty 1

## 2015-06-26 MED ORDER — OXYCODONE-ACETAMINOPHEN 5-325 MG PO TABS: 0.5000 | ORAL_TABLET | Freq: Three times a day (TID) | ORAL | Status: DC | PRN

## 2015-06-26 MED ORDER — PREDNISONE 20 MG PO TABS: 60.0000 mg | ORAL_TABLET | Freq: Every day | ORAL | Status: DC

## 2015-06-26 MED ORDER — OXYCODONE-ACETAMINOPHEN 5-325 MG PO TABS
2.0000 | ORAL_TABLET | Freq: Once | ORAL | Status: AC
Start: 2015-06-26 — End: 2015-06-26
  Administered 2015-06-26: 2 via ORAL
  Filled 2015-06-26: qty 2

## 2015-06-26 MED ORDER — DIPHENHYDRAMINE HCL 25 MG PO CAPS
50.0000 mg | ORAL_CAPSULE | Freq: Once | ORAL | Status: AC
  Administered 2015-06-26: 50 mg via ORAL
  Filled 2015-06-26: qty 2

## 2015-06-26 NOTE — ED Notes (Signed)
Discharge instructions given, pt demonstrated teach back and verbal understanding. No concerns voiced.  

## 2015-06-26 NOTE — ED Provider Notes (Signed)
CSN: [id number]     Arrival date & time 06/26/15  1826 History   First MD Initiated Contact with Patient 06/26/15 1857     Chief Complaint  Patient presents with  . Insect Bite     (Consider location/radiation/quality/duration/timing/severity/associated sxs/prior Treatment) Patient is a 74 y.o. female presenting with animal bite.  Animal Bite Contact animal:  Insect Location:  Head/neck, hand, shoulder/arm and leg Time since incident:  30 minutes Pain details:    Quality:  Aching and sharp   Severity:  Severe   Timing:  Constant Incident location:  Home Provoked: provoked   Relieved by:  None tried Worsened by:  Nothing tried Ineffective treatments:  None tried Associated symptoms: rash   Associated symptoms: no fever, no numbness and no swelling     Past Medical History  Diagnosis Date  . Allergy   . Diabetes mellitus   . Hypertension   . Hyperlipidemia   . Arthritis   . Migraines   . 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 Tubal ligation    . Cholecystectomy     Family History  Problem Relation Age of Onset  . Arthritis      fhx  . Hyperlipidemia      fhx  . Hypertension      fhx  . Diabetes      fhx  . Stroke      fhx   Social History  Substance Use Topics  . Smoking status: Never Smoker   . Smokeless tobacco: None  . Alcohol Use: No   OB History    No data available     Review of Systems  Constitutional: Negative for fever and chills.  Respiratory: Negative for cough, chest tightness and shortness of breath.   Cardiovascular: Negative for chest pain.  Gastrointestinal: Positive for nausea and vomiting (resolved now).  Endocrine: Negative for polydipsia and polyuria.  Skin: Positive for rash and wound. Negative for color change.  Neurological: Negative for numbness.  All other systems reviewed and are negative.     Allergies  Azithromycin and Penicillins  Home  Medications   Prior to Admission medications   Medication Sig Start Date End Date Taking? Authorizing Provider  Ascorbic Acid (VITAMIN C) 1000 MG tablet Take 1,000 mg by mouth daily.   Yes Historical Provider, MD  aspirin EC 81 MG tablet Take 81 mg by mouth daily.     Yes Historical Provider, MD  Calcium Carbonate (CALCIUM 600 PO) Take 600-900 mg by mouth daily. Take one tablet by mouth every morning and take one and one-half tablet by mouth at bedtime    Yes Historical Provider, MD  carvedilol (COREG) 6.25 MG tablet Take 1 tablet (6.25 mg total) by mouth 2 (two) times daily. 04/13/15  Yes [name], MD  cholecalciferol (VITAMIN D) 1000 UNITS tablet Take 1,000 Units by mouth daily.     Yes Historical Provider, MD  Cyanocobalamin (VITAMIN B-12) 1000 MCG SUBL Place 1,000 mcg under the tongue daily.     Yes Historical Provider, MD  glipiZIDE (GLUCOTROL XL) 2.5 MG 24 hr tablet Take 1 tablet (2.5 mg total) by mouth daily. 04/13/15  Yes [name], MD  hydrochlorothiazide (HYDRODIURIL) 25 MG tablet Take 1 tablet (25 mg total) by mouth daily. Patient taking differently: Take 12.5 mg by mouth daily.  04/15/15  Yes [name], MD  lisinopril (PRINIVIL,ZESTRIL) 40  MG tablet Take 1 tablet (40 mg total) by mouth daily. 04/13/15  Yes [name], MD  lovastatin (MEVACOR) 40 MG tablet Take 1 tablet (40 mg total) by mouth at bedtime. 04/15/15  Yes [name], MD  meloxicam (MOBIC) 15 MG tablet TAKE ONE TABLET BY MOUTH ONCE DAILY AS NEEDED Patient taking differently: TAKE ONE TABLET BY MOUTH ONCE DAILY AS NEEDED FOR PAIN 05/03/15  Yes [name], MD  metFORMIN (GLUCOPHAGE) 500 MG tablet TAKE TWO TABLETS BY MOUTH IN THE MORNING AND ONE IN THE EVENING Patient taking differently: Take 500-1,000 mg by mouth 2 (two) times daily. TAKE TWO TABLETS BY MOUTH IN THE MORNING AND ONE IN THE EVENING 04/15/15  Yes [name], MD  Multiple Vitamin (MULTIVITAMIN) tablet Take 1 tablet by mouth  daily.     Yes Historical Provider, MD  NIFEdipine (PROCARDIA-XL/ADALAT CC) 30 MG 24 hr tablet Take 1 tablet (30 mg total) by mouth daily. 04/15/15  Yes [name], MD  sertraline (ZOLOFT) 50 MG tablet Take 1 tablet (50 mg total) by mouth daily. 04/15/15  Yes [name], MD  diphenhydrAMINE (BENADRYL) 25 MG tablet Take 1 tablet (25 mg total) by mouth every 6 (six) hours as needed for itching. 06/26/15   Merrily Pew, MD  oxyCODONE-acetaminophen (PERCOCET/ROXICET) 5-325 MG per tablet Take 0.5 tablets by mouth every 8 (eight) hours as needed for severe pain. 06/26/15   Merrily Pew, MD  predniSONE (DELTASONE) 20 MG tablet Take 3 tablets (60 mg total) by mouth daily with breakfast. 06/27/15   Merrily Pew, MD   BP 209/81 mmHg  Pulse 84  Temp(Src) 98 F (36.7 C) (Oral)  Resp 18  Ht 5\' 5"  (1.651 m)  Wt 211 lb (95.709 kg)  BMI 35.11 kg/m2  SpO2 100% Physical Exam  Constitutional: She is oriented to person, place, and time. She appears well-developed and well-nourished.  HENT:  Head: Normocephalic and atraumatic.  Eyes: Conjunctivae and EOM are normal. Right eye exhibits no discharge. Left eye exhibits no discharge.  Cardiovascular: Normal rate and regular rhythm.   Pulmonary/Chest: Effort normal and breath sounds normal. No respiratory distress.  Abdominal: Soft. She exhibits no distension. There is no tenderness. There is no rebound.  Musculoskeletal: Normal range of motion. She exhibits no edema or tenderness.  Neurological: She is alert and oriented to person, place, and time.  Skin: Skin is warm and dry. Rash noted. There is erythema (multiple apparent bites on bilateral anterior/posterior lower extremities, abdomen, forearms, upper arms, neck and hands).  Nursing note and vitals reviewed.   ED Course  Procedures (including critical care time) Labs Review Labs Reviewed - No data to display  Imaging Review No results found. I have personally reviewed and evaluated these  images and lab results as part of my medical decision-making.   EKG Interpretation None      MDM   Final diagnoses:  Insect bite   Got into a nest of yellow jackets with multiple bites. Initially with some nausea, resolved. No CP, SOB, itchy throat, throat swelling or other s/s of anaphylaxis. Will treat for allergic reaction just to attempt reduction in pain, swelling. Will also tx pain with narcotics.  For reevaluation patient's symptoms progressively improved still no shortness of breath or evidence of anaphylaxis. I will discharge her home on a burst of steroids along with when necessary pain medication Benadryl for symptom management. She'll return here for any signs of anaphylaxis.  I have personally and contemperaneously  reviewed labs and imaging and used in my decision making as above.   A medical screening exam was performed and I feel the patient has had an appropriate workup for their chief complaint at this time and likelihood of emergent condition existing is low. They have been counseled on decision, discharge, follow up and which symptoms necessitate immediate return to the emergency department. They or their family verbally stated understanding and agreement with plan and discharged in stable condition.      Merrily Pew, MD 06/26/15 2203

## 2015-06-26 NOTE — ED Notes (Signed)
Patient is not in any respiratory distress. Patient throat is not swollen. Patient reports not being short of breath.

## 2015-06-26 NOTE — ED Notes (Signed)
EMS reports pt was outside with husband working in the yard and was stung multiple times by yellow jackets.  EMS reports no allergy to bees.  EMS reports pt's bp 213/138, HR 87, 02 98%, and cbg 141.  NSR on 12 monitor per EMS.  EMS put 18g IV in left ac.

## 2015-07-21 ENCOUNTER — Ambulatory Visit
Admission: RE | Admit: 2015-07-21 | Discharge: 2015-07-21 | Disposition: A | Discharge status: Home / Self Care | Payer: Medicare Other | Source: Ambulatory Visit

## 2015-07-21 DIAGNOSIS — E119 Type 2 diabetes mellitus without complications: Secondary | ICD-10-CM | POA: Diagnosis not present

## 2015-07-21 DIAGNOSIS — H43813 Vitreous degeneration, bilateral: Secondary | ICD-10-CM | POA: Diagnosis not present

## 2015-07-21 DIAGNOSIS — H2513 Age-related nuclear cataract, bilateral: Secondary | ICD-10-CM | POA: Diagnosis not present

## 2015-07-21 DIAGNOSIS — Z1231 Encounter for screening mammogram for malignant neoplasm of breast: Secondary | ICD-10-CM | POA: Diagnosis not present

## 2015-07-21 LAB — HM DIABETES EYE EXAM

## 2015-07-29 ENCOUNTER — Encounter: Payer: Self-pay | Admitting: Family Medicine

## 2015-07-30 ENCOUNTER — Other Ambulatory Visit: Payer: Self-pay | Admitting: Family Medicine

## 2015-07-30 ENCOUNTER — Other Ambulatory Visit: Payer: Self-pay | Admitting: *Deleted

## 2015-07-30 MED ORDER — MELOXICAM 15 MG PO TABS: ORAL_TABLET | ORAL | Status: DC

## 2015-08-04 ENCOUNTER — Encounter: Payer: Self-pay | Admitting: Family Medicine

## 2015-10-14 ENCOUNTER — Ambulatory Visit: Payer: Medicare Other | Admitting: Family Medicine

## 2015-10-26 ENCOUNTER — Encounter: Payer: Self-pay | Admitting: Family Medicine

## 2015-10-26 ENCOUNTER — Ambulatory Visit (INDEPENDENT_AMBULATORY_CARE_PROVIDER_SITE_OTHER): Payer: Medicare Other | Admitting: Family Medicine

## 2015-10-26 VITALS — BP 128/64 | HR 77 | Temp 98.1°F | Resp 16 | Ht 65.0 in | Wt 211.1 lb

## 2015-10-26 DIAGNOSIS — Z23 Encounter for immunization: Secondary | ICD-10-CM | POA: Diagnosis not present

## 2015-10-26 DIAGNOSIS — N183 Chronic kidney disease, stage 3 unspecified: Secondary | ICD-10-CM | POA: Insufficient documentation

## 2015-10-26 DIAGNOSIS — E119 Type 2 diabetes mellitus without complications: Secondary | ICD-10-CM

## 2015-10-26 DIAGNOSIS — I10 Essential (primary) hypertension: Secondary | ICD-10-CM

## 2015-10-26 LAB — BASIC METABOLIC PANEL
BUN: 33 mg/dL — ABNORMAL HIGH (ref 6–23)
CO2: 27 mEq/L (ref 19–32)
Calcium: 9.5 mg/dL (ref 8.4–10.5)
Chloride: 103 mEq/L (ref 96–112)
Creatinine, Ser: 1.42 mg/dL — ABNORMAL HIGH (ref 0.40–1.20)
GFR: 38.47 mL/min — ABNORMAL LOW (ref >60.00)
Glucose, Bld: 122 mg/dL — ABNORMAL HIGH (ref 70–99)
Potassium: 4.8 mEq/L (ref 3.5–5.1)
Sodium: 139 mEq/L (ref 135–145)

## 2015-10-26 LAB — HEMOGLOBIN A1C: Hgb A1c MFr Bld: 6.9 % — ABNORMAL HIGH (ref 4.6–6.5)

## 2015-10-26 NOTE — Progress Notes (Signed)
Pre visit review using our clinic review tool, if applicable. No additional management support is needed unless otherwise documented below in the visit note. 

## 2015-10-26 NOTE — Progress Notes (Signed)
   Subjective:    Patient ID: Brenda Patterson, female    DOB: 1941-08-24, 74 y.o.   MRN: [id number]  HPI Medical follow-up  Type 2 diabetes. Stable with recent A1c 6.8%. Does not monitor blood sugars regularly. No polyuria or polydipsia. Medications reviewed. Compliant with all. No history of neuropathy  She does have some chronic kidney disease stage III No recent peripheral edema issues  Hypertension. This has been challenging to manage in past but is well-controlled today. Takes multiple medications and these were reviewed. Avoids nonsteroidals  Needs flu vaccine.  Past Medical History  Diagnosis Date  . Allergy   . Diabetes mellitus   . Hypertension   . Hyperlipidemia   . Arthritis   . Migraines   . 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 Tubal ligation    . Cholecystectomy      reports that she has never smoked. She does not have any smokeless tobacco history on file. She reports that she does not drink alcohol or use illicit drugs. family history is not on file. Allergies  Allergen Reactions  . Azithromycin Nausea Only  . Penicillins Rash      Review of Systems  Constitutional: Negative for fatigue.  Eyes: Negative for visual disturbance.  Respiratory: Negative for cough, chest tightness, shortness of breath and wheezing.   Cardiovascular: Negative for chest pain, palpitations and leg swelling.  Endocrine: Negative for polydipsia and polyuria.  Neurological: Negative for dizziness, seizures, syncope, weakness, light-headedness and headaches.       Objective:   Physical Exam  Constitutional: She appears well-developed and well-nourished.  Eyes: Pupils are equal, round, and reactive to light.  Neck: Neck supple. No JVD present. No thyromegaly present.  Cardiovascular: Normal rate and regular rhythm.  Exam reveals no gallop.   Pulmonary/Chest: Effort normal and breath sounds  normal. No respiratory distress. She has no wheezes. She has no rales.  Musculoskeletal: She exhibits no edema.  Neurological: She is alert.  Skin:  Feet reveal no skin lesions. Good distal foot pulses. Good capillary refill. No calluses. Normal sensation with monofilament testing           Assessment & Plan:  #1 type 2 diabetes. History of stable control. Recheck A1c  #2 hypertension stable and at goal. Continue current medications  #3 chronic kidney disease stage III. Recheck basic metabolic panel

## 2015-11-10 ENCOUNTER — Other Ambulatory Visit: Payer: Self-pay | Admitting: Family Medicine

## 2015-11-11 ENCOUNTER — Ambulatory Visit (INDEPENDENT_AMBULATORY_CARE_PROVIDER_SITE_OTHER): Payer: Medicare Other | Admitting: Family Medicine

## 2015-11-11 ENCOUNTER — Encounter: Payer: Self-pay | Admitting: Family Medicine

## 2015-11-11 VITALS — BP 138/87 | HR 68 | Temp 98.1°F | Resp 20 | Wt 211.5 lb

## 2015-11-11 DIAGNOSIS — J069 Acute upper respiratory infection, unspecified: Secondary | ICD-10-CM

## 2015-11-11 DIAGNOSIS — J0111 Acute recurrent frontal sinusitis: Secondary | ICD-10-CM

## 2015-11-11 DIAGNOSIS — J0110 Acute frontal sinusitis, unspecified: Secondary | ICD-10-CM | POA: Insufficient documentation

## 2015-11-11 MED ORDER — BENZONATATE 100 MG PO CAPS: 100.0000 mg | ORAL_CAPSULE | Freq: Three times a day (TID) | ORAL | Status: DC | PRN

## 2015-11-11 MED ORDER — PREDNISONE 20 MG PO TABS: 60.0000 mg | ORAL_TABLET | Freq: Every day | ORAL | Status: DC

## 2015-11-11 MED ORDER — FLUTICASONE PROPIONATE 50 MCG/ACT NA SUSP: 2.0000 | Freq: Every day | NASAL | Status: DC

## 2015-11-11 MED ORDER — DOXYCYCLINE HYCLATE 100 MG PO TABS: 100.0000 mg | ORAL_TABLET | Freq: Two times a day (BID) | ORAL | Status: DC

## 2015-11-11 NOTE — Progress Notes (Signed)
Patient ID: Brenda Patterson, female   DOB: 01-Sep-1941, 75 y.o.   MRN: [id number]   Subjective:    Patient ID: Brenda Patterson   DOB: 1941-04-23, 75 y.o.    MRN: [id number]  HPI  Cough:  Patient presents with a 3 day history of sneezing , cough that has turned productive /yellow , hoarseness , nasal congestion, rhinorrhea, sore throat, decreased appetite in bilateral sinus pressure and headache. Patient is up-to-date with her flu shot. No history of asthma or COPD. Last GFR collected was 38.  patient denies nausea, vomit, diarrhea or rash. Flu shot UTD.  Per EMR tdap  Is due. OTC: "sinus med that is safe with HTN" GFR 38  Past Medical History  Diagnosis Date  . Allergy   . Diabetes mellitus   . Hypertension   . Hyperlipidemia   . Arthritis   . Migraines   . 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 Azithromycin Nausea Only  . Penicillins Rash   Past Surgical History  Procedure Laterality Date  . Tubal ligation    . Cholecystectomy     Social History  Substance Use Topics  . Smoking status: Never Smoker   . Smokeless tobacco: Not on file  . Alcohol Use: No    Review of Systems Negative, with the exception of above mentioned in HPI     Objective:   Physical Exam BP 138/87 mmHg  Pulse 68  Temp(Src) 98.1 F (36.7 C)  Resp 20  Wt 211 lb 8 oz (95.936 kg)  SpO2 99% Body mass index is 35.2 kg/(m^2). Gen: Afebrile. No acute distress. Nontoxic in appearance. Well developed, well nourished,  Pleasant Caucasian female. Appears fatigued. HENT: AT. [place]. Bilateral TM visualized,  No erythema or bulging, full tympanic membrane.. MMM, no oral lesions. Bilateral nares  With erythema in mild swelling. Throat  without erythema,  or exudates. Cough  present, Hoarseness  present, TTP  Bilateral frontal and maxillary sinuses Eyes:Pupils Equal Round Reactive to light, Extraocular movements intact,  Conjunctiva without redness, discharge  or icterus. Neck/lymp/endocrine: Supple,  Cervical anterior lymphadenopathy CV: RRR  Chest: CTAB, no wheeze or crackles Abd: Soft. NTND. BS  present Skin:  no rashes, purpura or petechiae.    Assessment & Plan:  Brenda Patterson is a 75 y.o. present for acute OV with  1. cough - benzonatate (TESSALON) 100 MG capsule; Take 1 capsule (100 mg total) by mouth 3 (three) times daily as needed for cough.  Dispense: 30 capsule; Refill: 0  2. Acute recurrent frontal sinusitis -  Doxycycline, Flonase, Tessalon Perles and prednisone. -  Rest and hydrate , humidifier use at night. - doxycycline (VIBRA-TABS) 100 MG tablet; Take 1 tablet (100 mg total) by mouth 2 (two) times daily.  Dispense: 20 tablet; Refill: 0 - fluticasone (FLONASE) 50 MCG/ACT nasal spray; Place 2 sprays into both nostrils daily.  Dispense: 16 g; Refill: 2   patient to follow with primary care provider to update tetanus vaccination if indeed she is due.  follow-up with PCP in one week  No improvement

## 2015-11-11 NOTE — Patient Instructions (Signed)

## 2015-12-27 ENCOUNTER — Encounter: Payer: Self-pay | Admitting: Family Medicine

## 2015-12-27 ENCOUNTER — Ambulatory Visit (INDEPENDENT_AMBULATORY_CARE_PROVIDER_SITE_OTHER): Payer: Medicare Other | Admitting: Family Medicine

## 2015-12-27 VITALS — BP 140/90 | HR 72 | Temp 99.4°F | Wt 211.0 lb

## 2015-12-27 DIAGNOSIS — M25561 Pain in right knee: Secondary | ICD-10-CM

## 2015-12-27 NOTE — Progress Notes (Signed)
   Subjective:    Patient ID: Brenda Patterson, female    DOB: 05-Jan-1941, 75 y.o.   MRN: [id number]  HPI Acute visit for right knee pain. Onset last week. She's had previous left knee pain and arthroscopic surgery several years ago and now states the right knee feels similar. She denies any specific injury. Last week she noticed one night after rolling over in bed that the pain particularly got worse. Pain is somewhat poorly localized but mostly lateral and posterior. She's not noted any swelling or redness. No locking or giving way. She is walking with a limp. She has tried some over-the-counter topical rubs along with ice without much improvement. Denies any prior history of right knee surgery  Past Medical History  Diagnosis Date  . Allergy   . Diabetes mellitus   . Hypertension   . Hyperlipidemia   . Arthritis   . Migraines   . 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 Tubal ligation    . Cholecystectomy      reports that she has never smoked. She does not have any smokeless tobacco history on file. She reports that she does not drink alcohol or use illicit drugs. family history is not on file. Allergies  Allergen Reactions  . Azithromycin Nausea Only  . Penicillins Rash      Review of Systems  Musculoskeletal: Negative for back pain.  Neurological: Negative for weakness and numbness.       Objective:   Physical Exam  Constitutional: She appears well-developed and well-nourished.  Cardiovascular: Normal rate and regular rhythm.   Pulmonary/Chest: Effort normal and breath sounds normal. No respiratory distress. She has no wheezes. She has no rales.  Musculoskeletal:  Right knee patient has pain with flexion and extension. No warmth. No ecchymosis. No erythema. No effusion. She has some poorly localized tenderness lateral joint space and to lesser extent medial joint space. We had difficult time  checking ligament stability secondary to pain.          Assessment & Plan:  Right knee pain. Somewhat nonfocal exam. No injury.  No effusion. Question lateral meniscus injury. We have recommended topical Voltaren gel which she has at home and continue frequent elevation and knee sleeve for support. Consider MRI or orthopedic referral in 2-3 weeks if not improving

## 2015-12-27 NOTE — Patient Instructions (Signed)
Use the Voltaren gel three times daily Consider elastic sleeve for additional support. Elevate frequently at night. Let me know in 2-3 weeks if no better.

## 2015-12-27 NOTE — Progress Notes (Signed)
Pre visit review using our clinic review tool, if applicable. No additional management support is needed unless otherwise documented below in the visit note. 

## 2015-12-28 ENCOUNTER — Telehealth: Payer: Self-pay | Admitting: Family Medicine

## 2015-12-28 NOTE — Telephone Encounter (Signed)
Pt needs new rx voltaren 1% gel. [facility] [employer]

## 2015-12-28 NOTE — Telephone Encounter (Signed)
Pt has not used since 2012. Please advise on how she should this before I sent it in, thanks.

## 2015-12-29 MED ORDER — DICLOFENAC SODIUM 1 % TD GEL: 2.0000 g | Freq: Two times a day (BID) | TRANSDERMAL | Status: DC | PRN

## 2015-12-29 NOTE — Telephone Encounter (Signed)
Apply to affected knee TID prn pain.

## 2015-12-29 NOTE — Telephone Encounter (Signed)
Medication sent in for patient. 

## 2016-02-14 ENCOUNTER — Ambulatory Visit (INDEPENDENT_AMBULATORY_CARE_PROVIDER_SITE_OTHER): Payer: Medicare Other | Admitting: Family Medicine

## 2016-02-14 VITALS — BP 144/72 | HR 90 | Temp 98.7°F | Ht 65.0 in | Wt 205.0 lb

## 2016-02-14 DIAGNOSIS — J019 Acute sinusitis, unspecified: Secondary | ICD-10-CM

## 2016-02-14 MED ORDER — DOXYCYCLINE HYCLATE 100 MG PO CAPS: 100.0000 mg | ORAL_CAPSULE | Freq: Two times a day (BID) | ORAL | Status: DC

## 2016-02-14 NOTE — Progress Notes (Signed)
Pre visit review using our clinic review tool, if applicable. No additional management support is needed unless otherwise documented below in the visit note. 

## 2016-02-14 NOTE — Patient Instructions (Signed)

## 2016-02-14 NOTE — Progress Notes (Signed)
   Subjective:    Patient ID: Brenda Patterson, female    DOB: [birth year], 75 y.o.   MRN: [id number]  HPI Acute Visit  Patient seen today with a complaint of nasal drainage/congestion, headache, fatigue, and nonproductive cough times 2 weeks.  Patient reports "stomach virus" times 3 weeks ago which has completely resolved. Denies fever, nausea, or vomiting.  She reports an intermittent frontal headache that has accompanied nasal congestion. She has used Flonase nasal spray with some improvement of nasal congestion.  Past Medical History  Diagnosis Date  . Allergy   . Diabetes mellitus   . Hypertension   . Hyperlipidemia   . Arthritis   . Migraines   . 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 Tubal ligation    . Cholecystectomy      reports that she has never smoked. She does not have any smokeless tobacco history on file. She reports that she does not drink alcohol or use illicit drugs. family history is not on file. Allergies  Allergen Reactions  . Azithromycin Nausea Only  . Penicillins Rash      Review of Systems  Constitutional: Negative.   HENT:       See HPI  Eyes: Positive for discharge.       Reports clear drainage from left eye  Respiratory: Positive for cough. Negative for shortness of breath and wheezing.   Cardiovascular: Negative.   Gastrointestinal: Negative.        Objective:   Physical Exam  Constitutional: She is oriented to person, place, and time. She appears well-developed and well-nourished.  HENT:  Head: Normocephalic and atraumatic.  Right Ear: External ear normal.  Left Ear: External ear normal.  Eyes: Conjunctivae and EOM are normal. Pupils are equal, round, and reactive to light.  Conjunctiva non-erythematous. Sclera non-erythematous   Neck: Normal range of motion.  Anterior cervical adenopathy present bilaterally  Cardiovascular: Normal rate and regular rhythm.    Pulmonary/Chest: Effort normal and breath sounds normal.  Lymphadenopathy:    She has cervical adenopathy.  Neurological: She is alert and oriented to person, place, and time. She has normal reflexes.  Skin: Skin is warm and dry.          Assessment & Plan:  Acute Sinusitis- Patient presents with a two week history of upper respiratory symptoms.  Her cough is improving although she has had an intermittent headache with continued nasal congestion without relief for two weeks.  Plan: Doxycycline 100 mg twice daily for 10 days. Continue Flonase as needed. Follow-up as needed.

## 2016-04-24 ENCOUNTER — Ambulatory Visit: Payer: Medicare Other | Admitting: Family Medicine

## 2016-05-01 ENCOUNTER — Ambulatory Visit (INDEPENDENT_AMBULATORY_CARE_PROVIDER_SITE_OTHER): Payer: Medicare Other | Admitting: Family Medicine

## 2016-05-01 VITALS — BP 120/70 | HR 79 | Temp 97.5°F | Ht 65.0 in | Wt 205.0 lb

## 2016-05-01 DIAGNOSIS — D649 Anemia, unspecified: Secondary | ICD-10-CM | POA: Diagnosis not present

## 2016-05-01 DIAGNOSIS — N183 Chronic kidney disease, stage 3 unspecified: Secondary | ICD-10-CM

## 2016-05-01 DIAGNOSIS — E785 Hyperlipidemia, unspecified: Secondary | ICD-10-CM

## 2016-05-01 DIAGNOSIS — I10 Essential (primary) hypertension: Secondary | ICD-10-CM | POA: Diagnosis not present

## 2016-05-01 DIAGNOSIS — E119 Type 2 diabetes mellitus without complications: Secondary | ICD-10-CM

## 2016-05-01 LAB — CBC WITH DIFFERENTIAL/PLATELET
Basophils Absolute: 0 10*3/uL (ref 0.0–0.1)
Basophils Relative: 0.4 % (ref 0.0–3.0)
Eosinophils Absolute: 0.6 10*3/uL (ref 0.0–0.7)
Eosinophils Relative: 8 % — ABNORMAL HIGH (ref 0.0–5.0)
HCT: 32.3 % — ABNORMAL LOW (ref 36.0–46.0)
Hemoglobin: 10.8 g/dL — ABNORMAL LOW (ref 12.0–15.0)
Lymphocytes Relative: 32.5 % (ref 12.0–46.0)
Lymphs Abs: 2.3 10*3/uL (ref 0.7–4.0)
MCHC: 33.5 g/dL (ref 30.0–36.0)
MCV: 86.8 fl (ref 78.0–100.0)
Monocytes Absolute: 0.5 10*3/uL (ref 0.1–1.0)
Monocytes Relative: 7.2 % (ref 3.0–12.0)
Neutro Abs: 3.6 10*3/uL (ref 1.4–7.7)
Neutrophils Relative %: 51.9 % (ref 43.0–77.0)
Platelets: 252 10*3/uL (ref 150.0–400.0)
RBC: 3.72 Mil/uL — ABNORMAL LOW (ref 3.87–5.11)
RDW: 14.6 % (ref 11.5–15.5)
WBC: 7 10*3/uL (ref 4.0–10.5)

## 2016-05-01 LAB — LIPID PANEL
Cholesterol: 178 mg/dL (ref 0–200)
HDL: 37.5 mg/dL — ABNORMAL LOW (ref >39.00)
NonHDL: 140.88
Total CHOL/HDL Ratio: 5
Triglycerides: 337 mg/dL — ABNORMAL HIGH (ref 0.0–149.0)
VLDL: 67.4 mg/dL — ABNORMAL HIGH (ref 0.0–40.0)

## 2016-05-01 LAB — HEPATIC FUNCTION PANEL
ALT: 16 U/L (ref 0–35)
AST: 16 U/L (ref 0–37)
Albumin: 3.9 g/dL (ref 3.5–5.2)
Alkaline Phosphatase: 77 U/L (ref 39–117)
Bilirubin, Direct: 0.1 mg/dL (ref 0.0–0.3)
Total Bilirubin: 0.4 mg/dL (ref 0.2–1.2)
Total Protein: 6.5 g/dL (ref 6.0–8.3)

## 2016-05-01 LAB — BASIC METABOLIC PANEL
BUN: 28 mg/dL — ABNORMAL HIGH (ref 6–23)
CO2: 27 mEq/L (ref 19–32)
Calcium: 9.6 mg/dL (ref 8.4–10.5)
Chloride: 102 mEq/L (ref 96–112)
Creatinine, Ser: 1.53 mg/dL — ABNORMAL HIGH (ref 0.40–1.20)
GFR: 35.24 mL/min — ABNORMAL LOW (ref >60.00)
Glucose, Bld: 185 mg/dL — ABNORMAL HIGH (ref 70–99)
Potassium: 4.1 mEq/L (ref 3.5–5.1)
Sodium: 137 mEq/L (ref 135–145)

## 2016-05-01 LAB — LDL CHOLESTEROL, DIRECT: Direct LDL: 108 mg/dL

## 2016-05-01 LAB — HEMOGLOBIN A1C: Hgb A1c MFr Bld: 7 % — ABNORMAL HIGH (ref 4.6–6.5)

## 2016-05-01 NOTE — Progress Notes (Signed)
   Subjective:    Patient ID: Brenda Patterson, female    DOB: 03-Oct-1941, 75 y.o.   MRN: [id number]  HPI Follow-up multiple issues  Type 2 diabetes. History of good control. Last A1c 6.9%. No recent hypoglycemia. Not monitoring blood sugars regularly. No polyuria or polydipsia.  Hypertension. Good control. No recent dizziness. Medications reviewed. Compliant with all.  Hyperlipidemia treated with lovastatin. No myalgias. No recent chest pains. She has history of normocytic anemia. No recent CBC. Also has history of mild B12 deficiency and shakes oral replacement.  Past Medical History  Diagnosis Date  . Allergy   . Diabetes mellitus   . Hypertension   . Hyperlipidemia   . Arthritis   . Migraines   . 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 Tubal ligation    . Cholecystectomy      reports that she has never smoked. She does not have any smokeless tobacco history on file. She reports that she does not drink alcohol or use illicit drugs. family history is not on file. Allergies  Allergen Reactions  . Azithromycin Nausea Only  . Penicillins Rash      Review of Systems  Constitutional: Negative for appetite change, fatigue and unexpected weight change.  Eyes: Negative for visual disturbance.  Respiratory: Negative for cough, chest tightness, shortness of breath and wheezing.   Cardiovascular: Negative for chest pain, palpitations and leg swelling.  Gastrointestinal: Negative for abdominal pain.  Genitourinary: Negative for dysuria.  Neurological: Negative for dizziness, seizures, syncope, weakness, light-headedness and headaches.       Objective:   Physical Exam  Constitutional: She appears well-developed and well-nourished.  Eyes: Pupils are equal, round, and reactive to light.  Neck: Neck supple. No JVD present. No thyromegaly present.  Cardiovascular: Normal rate and regular rhythm.  Exam  reveals no gallop.   Pulmonary/Chest: Effort normal and breath sounds normal. No respiratory distress. She has no wheezes. She has no rales.  Musculoskeletal: She exhibits no edema.  Neurological: She is alert. No cranial nerve deficit.          Assessment & Plan:  #1 type 2 diabetes. History of good control. Continue weight loss efforts. Recheck A1c  #2 hypertension. Stable and at goal. Continue current medications  #3 chronic kidney disease. Avoid all nonsteroidals. Recheck basic metabolic panel  #4 history of normocytic anemia. Recheck CBC  #5 dyslipidemia. Check lipid and hepatic panel.  [name] MD [facility]

## 2016-05-01 NOTE — Progress Notes (Signed)
Pre visit review using our clinic review tool, if applicable. No additional management support is needed unless otherwise documented below in the visit note. 

## 2016-05-02 ENCOUNTER — Telehealth: Payer: Self-pay

## 2016-05-02 NOTE — Telephone Encounter (Signed)
Called patient. Gave lab results and instructions. Patient verbalized understanding.

## 2016-05-02 NOTE — Telephone Encounter (Signed)
-----   Message from [name], MD sent at 05/01/2016  9:09 PM EDT ----- Her creatinine is up to 1.5 (was 1.4)- so not a big jump.  Recommend: reduce her Lisinopril to 20 mg qd and stay well hydrated Repeat BMP in one month.  Avoid ALL Non-steroidals.

## 2016-05-19 ENCOUNTER — Telehealth: Payer: Self-pay | Admitting: Family Medicine

## 2016-05-19 MED ORDER — GLIPIZIDE ER 2.5 MG PO TB24: ORAL_TABLET | ORAL | Status: DC

## 2016-05-19 MED ORDER — CARVEDILOL 6.25 MG PO TABS: ORAL_TABLET | ORAL | Status: DC

## 2016-05-19 NOTE — Telephone Encounter (Signed)
Pt need new Rx for glipizide and carvedilol  (emergency refill out)   Pharm:  [facility]

## 2016-05-19 NOTE — Telephone Encounter (Signed)
Medication sent into the pharmacy. 

## 2016-05-30 ENCOUNTER — Other Ambulatory Visit: Payer: Self-pay | Admitting: Family Medicine

## 2016-05-30 DIAGNOSIS — Z1231 Encounter for screening mammogram for malignant neoplasm of breast: Secondary | ICD-10-CM

## 2016-06-12 ENCOUNTER — Other Ambulatory Visit (INDEPENDENT_AMBULATORY_CARE_PROVIDER_SITE_OTHER): Payer: Medicare Other

## 2016-06-12 DIAGNOSIS — I10 Essential (primary) hypertension: Secondary | ICD-10-CM | POA: Diagnosis not present

## 2016-06-12 LAB — BASIC METABOLIC PANEL
BUN: 20 mg/dL (ref 6–23)
CO2: 25 mEq/L (ref 19–32)
Calcium: 9.5 mg/dL (ref 8.4–10.5)
Chloride: 102 mEq/L (ref 96–112)
Creatinine, Ser: 1.36 mg/dL — ABNORMAL HIGH (ref 0.40–1.20)
GFR: 40.36 mL/min — ABNORMAL LOW (ref >60.00)
Glucose, Bld: 170 mg/dL — ABNORMAL HIGH (ref 70–99)
Potassium: 4.2 mEq/L (ref 3.5–5.1)
Sodium: 138 mEq/L (ref 135–145)

## 2016-06-13 MED ORDER — LISINOPRIL 20 MG PO TABS: 40.0000 mg | ORAL_TABLET | Freq: Every day | ORAL | Status: DC

## 2016-07-05 ENCOUNTER — Other Ambulatory Visit: Payer: Self-pay | Admitting: Family Medicine

## 2016-07-24 ENCOUNTER — Ambulatory Visit
Admission: RE | Admit: 2016-07-24 | Discharge: 2016-07-24 | Disposition: A | Discharge status: Home / Self Care | Payer: Medicare Other | Source: Ambulatory Visit | Attending: Family Medicine | Admitting: Family Medicine

## 2016-07-24 DIAGNOSIS — Z1231 Encounter for screening mammogram for malignant neoplasm of breast: Secondary | ICD-10-CM | POA: Diagnosis not present

## 2016-07-27 ENCOUNTER — Other Ambulatory Visit: Payer: Self-pay | Admitting: Family Medicine

## 2016-07-27 DIAGNOSIS — R928 Other abnormal and inconclusive findings on diagnostic imaging of breast: Secondary | ICD-10-CM

## 2016-08-02 ENCOUNTER — Ambulatory Visit
Admission: RE | Admit: 2016-08-02 | Discharge: 2016-08-02 | Disposition: A | Discharge status: Home / Self Care | Payer: Medicare Other | Source: Ambulatory Visit | Attending: Family Medicine | Admitting: Family Medicine

## 2016-08-02 DIAGNOSIS — R928 Other abnormal and inconclusive findings on diagnostic imaging of breast: Secondary | ICD-10-CM

## 2016-08-17 ENCOUNTER — Other Ambulatory Visit: Payer: Self-pay | Admitting: Family Medicine

## 2016-08-23 DIAGNOSIS — H2513 Age-related nuclear cataract, bilateral: Secondary | ICD-10-CM | POA: Diagnosis not present

## 2016-08-23 DIAGNOSIS — E119 Type 2 diabetes mellitus without complications: Secondary | ICD-10-CM | POA: Diagnosis not present

## 2016-09-18 ENCOUNTER — Other Ambulatory Visit: Payer: Self-pay | Admitting: Family Medicine

## 2016-09-20 ENCOUNTER — Other Ambulatory Visit: Payer: Self-pay | Admitting: Family Medicine

## 2016-10-03 ENCOUNTER — Other Ambulatory Visit: Payer: Self-pay | Admitting: Family Medicine

## 2016-10-23 ENCOUNTER — Ambulatory Visit (INDEPENDENT_AMBULATORY_CARE_PROVIDER_SITE_OTHER): Payer: Medicare Other | Admitting: Family Medicine

## 2016-10-23 VITALS — BP 180/80 | HR 79 | Temp 97.5°F | Ht 65.0 in | Wt 206.0 lb

## 2016-10-23 DIAGNOSIS — I10 Essential (primary) hypertension: Secondary | ICD-10-CM | POA: Diagnosis not present

## 2016-10-23 DIAGNOSIS — N183 Chronic kidney disease, stage 3 unspecified: Secondary | ICD-10-CM

## 2016-10-23 DIAGNOSIS — E119 Type 2 diabetes mellitus without complications: Secondary | ICD-10-CM

## 2016-10-23 DIAGNOSIS — Z23 Encounter for immunization: Secondary | ICD-10-CM | POA: Diagnosis not present

## 2016-10-23 DIAGNOSIS — R110 Nausea: Secondary | ICD-10-CM

## 2016-10-23 LAB — HEMOGLOBIN A1C: Hgb A1c MFr Bld: 7.1 % — ABNORMAL HIGH (ref 4.6–6.5)

## 2016-10-23 LAB — BASIC METABOLIC PANEL
BUN: 26 mg/dL — ABNORMAL HIGH (ref 6–23)
CO2: 26 mEq/L (ref 19–32)
Calcium: 9 mg/dL (ref 8.4–10.5)
Chloride: 102 mEq/L (ref 96–112)
Creatinine, Ser: 1.56 mg/dL — ABNORMAL HIGH (ref 0.40–1.20)
GFR: 34.42 mL/min — ABNORMAL LOW (ref >60.00)
Glucose, Bld: 151 mg/dL — ABNORMAL HIGH (ref 70–99)
Potassium: 4.2 mEq/L (ref 3.5–5.1)
Sodium: 139 mEq/L (ref 135–145)

## 2016-10-23 NOTE — Progress Notes (Signed)
Subjective:     Patient ID: Brenda Patterson, female   DOB: Sep 27, 1941, 75 y.o.   MRN: [id number]  HPI  Here for routine medical follow-up. She's had a stressful past few months. Her sister was diagnosed with pancreatic cancer and died within 51 days of her diagnosis. They were very close. Pt was eating less during this stretch. She is not monitoring blood sugars or blood pressure regularly. She states she is compliant with all medications. Denies any polyuria or polydipsia. No chest pains.  New acute issue of one day history of some nausea without vomiting and loose stools. Grandson with similar symptoms. No fever. No bloody stools. No abdominal pain. No dizziness. Still needs flu vaccination  Past Medical History:  Diagnosis Date  . Allergy   . Arthritis   . 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 Diabetes mellitus   . Hyperlipidemia   . Hypertension   . Migraines    Past Surgical History:  Procedure Laterality Date  . CHOLECYSTECTOMY    . TUBAL LIGATION      reports that she has never smoked. She does not have any smokeless tobacco history on file. She reports that she does not drink alcohol or use drugs. family history is not on file. Allergies  Allergen Reactions  . Azithromycin Nausea Only  . Penicillins Rash     Review of Systems  Constitutional: Negative for fatigue, fever and unexpected weight change.  Eyes: Negative for visual disturbance.  Respiratory: Negative for cough, chest tightness, shortness of breath and wheezing.   Cardiovascular: Negative for chest pain, palpitations and leg swelling.  Gastrointestinal: Positive for diarrhea and nausea. Negative for abdominal pain and vomiting.  Endocrine: Negative for polydipsia and polyuria.  Genitourinary: Negative for dysuria.  Neurological: Negative for dizziness, seizures, syncope, weakness, light-headedness and headaches.       Objective:   Physical Exam  Constitutional: She appears  well-developed and well-nourished.  HENT:  Mouth/Throat: Oropharynx is clear and moist.  Neck: Neck supple.  Cardiovascular: Normal rate and regular rhythm.   Pulmonary/Chest: Effort normal and breath sounds normal. No respiratory distress. She has no wheezes. She has no rales.  Abdominal: Soft. There is no tenderness.  Musculoskeletal: She exhibits no edema.       Assessment:     #1 type 2 diabetes. History of good control previously. Not monitoring regularly  #2 hypertension poorly controlled by today's reading but generally well-controlled in the past  #3 probable viral gastroenteritis  #4 chronic kidney disease    Plan:     -recheck basic metabolic panel and hemoglobin A1c -Offered Zofran for nausea but she thinks she has some at home -Stay well-hydrated -Flu vaccine given -Return within one week to reassess blood pressure. If still up at that point consider further titration of nifedipine  [name] MD [facility] at [facility]

## 2016-10-23 NOTE — Progress Notes (Signed)
Pre visit review using our clinic review tool, if applicable. No additional management support is needed unless otherwise documented below in the visit note. 

## 2016-11-01 ENCOUNTER — Ambulatory Visit (INDEPENDENT_AMBULATORY_CARE_PROVIDER_SITE_OTHER): Payer: Medicare Other | Admitting: Family Medicine

## 2016-11-01 VITALS — BP 150/70 | HR 86 | Temp 97.6°F | Ht 65.0 in | Wt 204.6 lb

## 2016-11-01 DIAGNOSIS — I10 Essential (primary) hypertension: Secondary | ICD-10-CM | POA: Diagnosis not present

## 2016-11-01 MED ORDER — NIFEDIPINE ER OSMOTIC RELEASE 60 MG PO TB24: 60.0000 mg | ORAL_TABLET | Freq: Every day | ORAL | 3 refills | Status: DC

## 2016-11-01 NOTE — Progress Notes (Signed)
Subjective:     Patient ID: Brenda Patterson, female   DOB: 1941-03-08, 75 y.o.   MRN: [id number]  HPI Patient for follow-up hypertension. Last visit she had elevated reading of the 180/80. She has been somewhat more conscious of sodium intake since then. She is not monitoring regularly at home. No headaches. No dizziness. Current blood pressure medications include lisinopril 20 mg daily, carvedilol 6.25 mg twice daily, HCTZ 25 mg daily, and nifedipine extended release 30 mg once daily.  No alcohol use.  Past Medical History:  Diagnosis Date  . Allergy   . Arthritis   . 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 Diabetes mellitus   . Hyperlipidemia   . Hypertension   . Migraines    Past Surgical History:  Procedure Laterality Date  . CHOLECYSTECTOMY    . TUBAL LIGATION      reports that she has never smoked. She does not have any smokeless tobacco history on file. She reports that she does not drink alcohol or use drugs. family history is not on file. Allergies  Allergen Reactions  . Azithromycin Nausea Only  . Penicillins Rash     Review of Systems  Constitutional: Negative for fatigue and unexpected weight change.  Eyes: Negative for visual disturbance.  Respiratory: Negative for cough, chest tightness, shortness of breath and wheezing.   Cardiovascular: Negative for chest pain, palpitations and leg swelling.  Endocrine: Negative for polydipsia and polyuria.  Neurological: Negative for dizziness, seizures, syncope, weakness, light-headedness and headaches.       Objective:   Physical Exam  Constitutional: She appears well-developed and well-nourished.  Eyes: Pupils are equal, round, and reactive to light.  Neck: Neck supple. No JVD present. No thyromegaly present.  Cardiovascular: Normal rate and regular rhythm.  Exam reveals no gallop.   Pulmonary/Chest: Effort normal and breath sounds normal. No respiratory distress. She has no wheezes. She has no rales.   Musculoskeletal: She exhibits no edema.  Neurological: She is alert.       Assessment:     Hypertension. Poorly controlled but improved compared to last visit    Plan:     -She is encouraged to lose some weight -Acute sodium intake less than 3000 mg daily -Titrate nifedipine extended release to 60 mg daily and watch for edema -Follow-up in one month to reassess blood pressure  [name] MD [facility] at [facility]

## 2016-12-01 ENCOUNTER — Ambulatory Visit (INDEPENDENT_AMBULATORY_CARE_PROVIDER_SITE_OTHER): Payer: Medicare Other | Admitting: Family Medicine

## 2016-12-01 ENCOUNTER — Encounter: Payer: Self-pay | Admitting: Family Medicine

## 2016-12-01 VITALS — BP 160/78 | HR 76 | Temp 97.6°F | Ht 65.0 in | Wt 203.0 lb

## 2016-12-01 DIAGNOSIS — N183 Chronic kidney disease, stage 3 unspecified: Secondary | ICD-10-CM

## 2016-12-01 DIAGNOSIS — J0110 Acute frontal sinusitis, unspecified: Secondary | ICD-10-CM

## 2016-12-01 DIAGNOSIS — I10 Essential (primary) hypertension: Secondary | ICD-10-CM

## 2016-12-01 LAB — BASIC METABOLIC PANEL
BUN: 27 mg/dL — ABNORMAL HIGH (ref 6–23)
CO2: 28 mEq/L (ref 19–32)
Calcium: 9.1 mg/dL (ref 8.4–10.5)
Chloride: 103 mEq/L (ref 96–112)
Creatinine, Ser: 1.47 mg/dL — ABNORMAL HIGH (ref 0.40–1.20)
GFR: 36.85 mL/min — ABNORMAL LOW (ref >60.00)
Glucose, Bld: 174 mg/dL — ABNORMAL HIGH (ref 70–99)
Potassium: 4.4 mEq/L (ref 3.5–5.1)
Sodium: 139 mEq/L (ref 135–145)

## 2016-12-01 MED ORDER — AMOXICILLIN-POT CLAVULANATE 875-125 MG PO TABS: 1.0000 | ORAL_TABLET | Freq: Two times a day (BID) | ORAL | 0 refills | Status: DC

## 2016-12-01 NOTE — Progress Notes (Signed)
Subjective:     Patient ID: Brenda Patterson, female   DOB: [birth year], 76 y.o.   MRN: [id number]  HPI Patient seen today for the following issues:  Hypertension. Recently increased her nifedipine to 60 mg. She had recent upper respiratory infection and has some generalized weakness and initially thought this was due to increase in medication. She's only been taking the 30 mg dose still. Her blood pressures been up and down at home. She is currently taking nifedipine 30 mg. No peripheral edema  Chronic kidney disease. Last creatinine had increased to 1.56. No peripheral edema issues. We decreased her lisinopril 20 mg daily. No regular nonsteroidal use  Third issue is recurrent sinusitis symptoms. Immediately after last visit she had upper respiratory symptoms which lasted for over 2 weeks. One of her grandsons had some leftover amoxicillin which she took it she felt somewhat better until about 8 or 9 days ago. She has some greenish nasal discharge. Frequent frontal sinus pressure and headaches. No fevers or chills. No sore throat.  Past Medical History:  Diagnosis Date  . Allergy   . Arthritis   . 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 Diabetes mellitus   . Hyperlipidemia   . Hypertension   . Migraines    Past Surgical History:  Procedure Laterality Date  . CHOLECYSTECTOMY    . TUBAL LIGATION      reports that she has never smoked. She has never used smokeless tobacco. She reports that she does not drink alcohol or use drugs. family history is not on file. Allergies  Allergen Reactions  . Azithromycin Nausea Only     Review of Systems  Constitutional: Positive for fatigue. Negative for fever.  HENT: Positive for congestion, sinus pain and sinus pressure. Negative for sore throat.   Eyes: Negative for visual disturbance.  Respiratory: Negative for cough, chest tightness, shortness of breath and wheezing.   Cardiovascular: Negative for chest pain, palpitations  and leg swelling.  Neurological: Negative for dizziness, seizures, syncope, weakness, light-headedness and headaches.       Objective:   Physical Exam  Constitutional: She appears well-developed and well-nourished.  HENT:  Right Ear: External ear normal.  Left Ear: External ear normal.  Mouth/Throat: Oropharynx is clear and moist.  Neck: Neck supple.  Cardiovascular: Normal rate and regular rhythm.   Pulmonary/Chest: Effort normal and breath sounds normal. No respiratory distress. She has no wheezes. She has no rales.  Musculoskeletal: She exhibits no edema.  Lymphadenopathy:    She has no cervical adenopathy.       Assessment:     #1 hypertension. She still has elevated reading today but improved by home readings. She is currently taking only 30 mg nifedipine  #2 chronic kidney disease with recent mild bump and we reduced her lisinopril  #3 probable recurrent frontal sinusitis    Plan:     -Go ahead and increase Nifedipine to 60 mg daily -Stay well-hydrated -Repeat basic metabolic panel -Reassess blood pressure one month -Consider Augmentin 875 mg twice daily for 10 days if she has any persistent or worsening sinusitis symptoms  [name] MD [facility] at [facility]

## 2016-12-01 NOTE — Patient Instructions (Signed)
Get back on the 60 mg dose of Nifedipine  Monitor blood pressure and be in touch if not consistently < 140/90

## 2016-12-01 NOTE — Progress Notes (Signed)
Pre visit review using our clinic review tool, if applicable. No additional management support is needed unless otherwise documented below in the visit note. 

## 2016-12-04 NOTE — Progress Notes (Signed)
It is recommended not to cut/crush/chew tablet so would not recommend taking one half bid. Consider taking at night.  I believe the dizziness will very likely get better some as she adjusts. Stay well hydrated.

## 2016-12-05 ENCOUNTER — Telehealth: Payer: Self-pay | Admitting: Family Medicine

## 2016-12-05 MED ORDER — CARVEDILOL 6.25 MG PO TABS: 6.2500 mg | ORAL_TABLET | Freq: Two times a day (BID) | ORAL | 0 refills | Status: DC

## 2016-12-05 NOTE — Telephone Encounter (Signed)
Pt needs new rx carvedilol #30 send to [facility]. Pt will re-order from [facility]. Pt also needs zofran [facility] [place]

## 2016-12-06 ENCOUNTER — Other Ambulatory Visit: Payer: Self-pay | Admitting: Family Medicine

## 2016-12-06 NOTE — Telephone Encounter (Signed)
Last refill 01-19-2015 #15, 0rf Lm message seeing why she is taking this medication bc its not on her up dated medication list.

## 2016-12-25 ENCOUNTER — Ambulatory Visit (INDEPENDENT_AMBULATORY_CARE_PROVIDER_SITE_OTHER): Payer: Medicare Other | Admitting: Family Medicine

## 2016-12-25 VITALS — BP 160/80 | HR 84 | Temp 98.2°F | Ht 65.0 in | Wt 204.0 lb

## 2016-12-25 DIAGNOSIS — J0111 Acute recurrent frontal sinusitis: Secondary | ICD-10-CM | POA: Diagnosis not present

## 2016-12-25 DIAGNOSIS — H109 Unspecified conjunctivitis: Secondary | ICD-10-CM

## 2016-12-25 MED ORDER — TOBRAMYCIN 0.3 % OP SOLN: 2.0000 [drp] | OPHTHALMIC | 0 refills | Status: DC

## 2016-12-25 MED ORDER — NIFEDIPINE ER OSMOTIC RELEASE 30 MG PO TB24: 30.0000 mg | ORAL_TABLET | Freq: Two times a day (BID) | ORAL | 11 refills | Status: DC

## 2016-12-25 MED ORDER — DOXYCYCLINE HYCLATE 100 MG PO CAPS: 100.0000 mg | ORAL_CAPSULE | Freq: Two times a day (BID) | ORAL | 0 refills | Status: DC

## 2016-12-25 NOTE — Progress Notes (Signed)
Subjective:     Patient ID: Brenda Patterson, female   DOB: 1941-08-08, 76 y.o.   MRN: [id number]  HPI Patient recently seen for presumed bacterial sinusitis. Treat with Augmentin. She's had some persistent frontal sinus pressure and pain and postnasal drainage. 10 days ago she had some yellow drainage from the right eye and subsequently spread to the left eye. Frequent crusted yellow drainage early morning. Responsive to warm compresses. No visual changes. No fever. No chills. Occasional cough. Increased malaise.  Past Medical History:  Diagnosis Date  . Allergy   . Arthritis   . 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 Diabetes mellitus   . Hyperlipidemia   . Hypertension   . Migraines    Past Surgical History:  Procedure Laterality Date  . CHOLECYSTECTOMY    . TUBAL LIGATION      reports that she has never smoked. She has never used smokeless tobacco. She reports that she does not drink alcohol or use drugs. family history is not on file. Allergies  Allergen Reactions  . Azithromycin Nausea Only     Review of Systems  HENT: Positive for postnasal drip, sinus pain and sinus pressure.   Eyes: Positive for discharge, redness and itching. Negative for visual disturbance.  Respiratory: Positive for cough.        Objective:   Physical Exam  Constitutional: She appears well-developed and well-nourished.  HENT:  Right Ear: External ear normal.  Left Ear: External ear normal.  Mouth/Throat: Oropharynx is clear and moist.  Eyes:  Conjunctiva mildly erythematous bilaterally. No purulent secretions noted at this time  Neck: Neck supple.  Cardiovascular: Normal rate and regular rhythm.   Pulmonary/Chest: Effort normal and breath sounds normal. No respiratory distress. She has no wheezes. She has no rales.  Lymphadenopathy:    She has no cervical adenopathy.       Assessment:     #1 bilateral bacterial conjunctivitis  #2 probable bifrontal sinusitis     Plan:     -Tobrex eyedrops 2 drops each eye every 4 hours while awake and continue warm compresses -Doxycycline 100 mg twice a day for 10 days -Schedule follow-up in 2 weeks to reassess blood pressure  [name] MD [facility] at [facility]

## 2016-12-25 NOTE — Patient Instructions (Signed)

## 2016-12-25 NOTE — Progress Notes (Signed)
Pre visit review using our clinic review tool, if applicable. No additional management support is needed unless otherwise documented below in the visit note. 

## 2016-12-29 ENCOUNTER — Ambulatory Visit: Payer: Medicare Other | Admitting: Family Medicine

## 2017-01-12 ENCOUNTER — Ambulatory Visit (INDEPENDENT_AMBULATORY_CARE_PROVIDER_SITE_OTHER): Payer: Medicare Other | Admitting: Family Medicine

## 2017-01-12 VITALS — BP 122/70 | HR 76 | Temp 98.3°F | Wt 200.9 lb

## 2017-01-12 DIAGNOSIS — I10 Essential (primary) hypertension: Secondary | ICD-10-CM

## 2017-01-12 NOTE — Progress Notes (Signed)
Subjective:     Patient ID: Brenda Patterson, female   DOB: 08-Aug-1941, 76 y.o.   MRN: [id number]  HPI Patient seen for follow-up hypertension. She was here a month ago with bacterial conjunctivitis and those symptoms have fully resolved. Her blood pressure was 160/80. We had to reduce her nifedipine dosage recently secondary to side effects and she's done well taking 30 mg daily. She is compliant with all her blood pressure medications. She has some allergic rhinitis symptoms which are almost daily and she is not consistently taking any medication for that. Otherwise feels well.  Past Medical History:  Diagnosis Date  . Allergy   . Arthritis   . 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 Diabetes mellitus   . Hyperlipidemia   . Hypertension   . Migraines    Past Surgical History:  Procedure Laterality Date  . CHOLECYSTECTOMY    . TUBAL LIGATION      reports that she has never smoked. She has never used smokeless tobacco. She reports that she does not drink alcohol or use drugs. family history is not on file. Allergies  Allergen Reactions  . Azithromycin Nausea Only     Review of Systems  Constitutional: Negative for activity change, appetite change, fatigue, fever and unexpected weight change.  HENT: Negative for ear pain, hearing loss, sore throat and trouble swallowing.   Eyes: Negative for visual disturbance.  Respiratory: Negative for cough and shortness of breath.   Cardiovascular: Negative for chest pain and palpitations.  Gastrointestinal: Negative for abdominal pain, blood in stool, constipation and diarrhea.  Genitourinary: Negative for dysuria and hematuria.  Musculoskeletal: Negative for arthralgias, back pain and myalgias.  Skin: Negative for rash.  Neurological: Negative for dizziness, syncope and headaches.  Hematological: Negative for adenopathy.  Psychiatric/Behavioral: Negative for confusion and dysphoric mood.       Objective:   Physical  Exam  Constitutional: She appears well-developed and well-nourished.  Eyes: Pupils are equal, round, and reactive to light.  Neck: Neck supple. No JVD present. No thyromegaly present.  Cardiovascular: Normal rate and regular rhythm.  Exam reveals no gallop.   Pulmonary/Chest: Effort normal and breath sounds normal. No respiratory distress. She has no wheezes. She has no rales.  Musculoskeletal: She exhibits no edema.  Neurological: She is alert.       Assessment:     Hypertension improved and at goal today    Plan:     -Continue current medications -Routine follow-up in 3 months and obtain lab work including A1c then -Continue weight loss efforts  [name] MD [facility] at [facility]

## 2017-01-12 NOTE — Progress Notes (Signed)
Pre visit review using our clinic review tool, if applicable. No additional management support is needed unless otherwise documented below in the visit note. 

## 2017-01-22 ENCOUNTER — Telehealth: Payer: Self-pay | Admitting: Family Medicine

## 2017-01-22 MED ORDER — TOBRAMYCIN 0.3 % OP SOLN: 2.0000 [drp] | OPHTHALMIC | 0 refills | Status: DC

## 2017-01-22 NOTE — Telephone Encounter (Signed)
Pt has [place] again and is asking if she can get more drops sent to [facility] in [place].

## 2017-01-22 NOTE — Telephone Encounter (Signed)
May refill Tobrex eye drops once.

## 2017-01-22 NOTE — Telephone Encounter (Signed)
Left message on machine for patient Rx ready for pick up

## 2017-01-23 ENCOUNTER — Ambulatory Visit (INDEPENDENT_AMBULATORY_CARE_PROVIDER_SITE_OTHER): Payer: Medicare Other

## 2017-01-23 VITALS — BP 156/70 | HR 76 | Ht 65.0 in | Wt 201.4 lb

## 2017-01-23 DIAGNOSIS — Z0000 Encounter for general adult medical examination without abnormal findings: Secondary | ICD-10-CM | POA: Diagnosis not present

## 2017-01-23 DIAGNOSIS — E2839 Other primary ovarian failure: Secondary | ICD-10-CM

## 2017-01-23 NOTE — Progress Notes (Addendum)
Subjective:   Brenda Patterson is a 76 y.o. female who presents for Medicare Annual (Subsequent) preventive examination.  The Patient was informed that the wellness visit is to identify future health risk and educate and initiate measures that can reduce risk for increased disease through the lifespan.    NO ROS; Medicare Wellness Visit LABS 1/26 2018   Stressors: Dtr had thyroid cancer; now in remission; followed by [facility] to stand now  Sister sick in the past year as well; had pancreatic cancer  First symptom was dizziness in 2023/06/06 and died 25-Jun-2023  Mother in law passed and sister in law passed in the last year  Psychosocial;  Lives a spouse 3 dtr  One grandson and now one great grandson   Acupuncturist as good, fair or great?  Good  Preventive Screening -Counseling & Management   Smoking history - never smoked Spouse smoked but quit years ago  Smokeless tobacco no   ETOH no  Medication adherence or issues?  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  Long term goal is to "age in place" or undecided   Mobility of Functional changes this year? no Safety; community, wears sunscreen, safe place for firearms; Motor vehicle accidents; all reviewed and no issues   Mental Health:  Any emotional  problems? Anxious, depressed, irritable, sad or blue?  no Denies feeling depressed or hopeless; voices pleasure in daily life How many social activities have you been engaged in within the last 2 weeks? no   Eye exam Last documented eye exam 04/2016 Due June 2018 approximately   Hearing Screening   125Hz  250Hz  500Hz  1000Hz  2000Hz  3000Hz  4000Hz  6000Hz  8000Hz   Right ear:     100      Left ear:     100      Comments: May have an issues with high sounds But has a cold as well today  Vision Screening Comments: Eye exam annually  Dr. Berenice Primas in [facility] practice  No issues   Activities of Daily Living - See functional screen   Cognitive testing;  Ad8 score; 0 or less than 2  MMSE deferred or completed if AD8 + 2 issues Ad8 score is 0   Advanced Directives has discussed with the family. Declines completing a HCPOA in writing   Patient Care Team: [name], MD as PCP - General   Immunization History  Administered Date(s) Administered  . Influenza Split 09/01/2011, 08/11/2013  . Influenza Whole 08/06/2009  . Influenza, High Dose Seasonal PF 10/26/2015, 10/23/2016  . Influenza,inj,Quad PF,36+ Mos 10/21/2014  . Pneumococcal Conjugate-13 10/21/2014  . Pneumococcal Polysaccharide-23 02/25/2009  . Td 11/06/2002   Required Immunizations needed today  Screening test up to  date or reviewed for plan of completion Health Maintenance Due  Topic Date Due  . DEXA SCAN  02/11/2007  . TETANUS/TDAP  11/06/2012  . COLONOSCOPY  11/07/2015  . FOOT EXAM  04/13/2016  . OPHTHALMOLOGY EXAM  07/20/2016   Dexa: screening and will take when she has her mammogram  Mother had osteoporosis  Agreed to screening mammogram in June but reports states September. States the [facility] will call her.  Needs Tdap - due 11/2012 Educated on vaccine with pertussis/  Will take at pharmacy or plan to take here when she sees Dr. Elease Hashimoto later this year, which will give her time to allow for  cost   Foot exam; completed and cuts her own toenails; no issues  No foot pain, no tingling and burning  Pulses difficult to palpate but foot warm and dry. No callus or painful issues note; no burning Sensation very good   Colonoscopy 11/2015 Will have one and will outreach Dr. Collene Mares   Hearing Screening   125Hz  250Hz  500Hz  1000Hz  2000Hz  3000Hz  4000Hz  6000Hz  8000Hz   Right ear:     100      Left ear:     100      Comments: May have an issues with high sounds But has a cold as well today  Vision Screening Comments: Eye exam annually  Dr. Berenice Primas in Dr. [facility] practice  No issues     Colonoscopy 11/2005 / due 11/2015/ will schedule  Mammogram 07/2016/ will have dexa this year with mammogram   Cardiac Risk Factors include: advanced age (>61men, >9 women);diabetes mellitus;dyslipidemia;hypertension;obesity (BMI >30kg/m2);sedentary lifestyle     Objective:     Vitals: BP (!) 160/70   Pulse 76   Ht 5\' 5"  (1.651 m)   Wt 201 lb 7 oz (91.4 kg)   SpO2 97%   BMI 33.52 kg/m   Body mass index is 33.52 kg/m.   Tobacco History  Smoking Status  . Never Smoker  Smokeless Tobacco  . Never Used     Counseling given: Yes   Past Medical History:  Diagnosis Date  . Allergy   . Arthritis   . 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 Diabetes mellitus   . Hyperlipidemia   . Hypertension   . Migraines    Past Surgical History:  Procedure Laterality Date  . CHOLECYSTECTOMY    . TUBAL LIGATION     Family History  Problem Relation Age of Onset  . Arthritis      fhx  . Hyperlipidemia      fhx  . Hypertension      fhx  . Diabetes      fhx  . Stroke      fhx   History  Sexual Activity  . Sexual activity: Yes    Outpatient Encounter Prescriptions as of 01/23/2017  Medication Sig  . Ascorbic Acid (VITAMIN C) 1000 MG tablet Take 1,000 mg by mouth daily.  Marland Kitchen aspirin EC 81 MG tablet Take 81 mg by mouth daily.    . Calcium Carbonate (CALCIUM 600 PO) Take  600-900 mg by mouth daily. Takes 900mg  in the morning and 600mg  at night.  . carvedilol (COREG) 6.25 MG tablet Take 1 tablet (6.25 mg total) by mouth 2 (two) times daily.  . cholecalciferol (VITAMIN D) 1000 UNITS tablet Take 1,000 Units by mouth daily.    Marland Kitchen glipiZIDE (GLUCOTROL XL) 2.5 MG 24 hr tablet TAKE 1 TABLET BY MOUTH  DAILY  . hydrochlorothiazide (HYDRODIURIL) 25 MG tablet Take 1 tablet by mouth  daily  . lisinopril (PRINIVIL,ZESTRIL) 20 MG tablet Take 20 mg by mouth daily.  Marland Kitchen lovastatin (MEVACOR) 40 MG tablet TAKE 1 TABLET BY MOUTH AT  BEDTIME  . metFORMIN (GLUCOPHAGE) 500 MG tablet TAKE 2 TABLETS BY MOUTH  EVERY MORNING AND 1 TABLET  BY MOUTH EVERY EVENING  . Multiple Vitamin (MULTIVITAMIN) tablet Take 1 tablet by mouth daily.    Marland Kitchen NIFEdipine (PROCARDIA XL) 30 MG 24 hr tablet Take 1 tablet (30 mg total) by mouth 2 (two) times daily.  . sertraline (ZOLOFT) 50 MG tablet Take 1 tablet by mouth  daily  . tobramycin (TOBREX) 0.3 % ophthalmic solution Place 2 drops into both eyes every 4 (four) hours.   No facility-administered encounter medications on file as of 01/23/2017.     Activities of Daily Living In your present state of health, do you have any difficulty performing the following activities: 01/23/2017  Hearing? N  Vision? N  Difficulty concentrating or making decisions? N  Walking or climbing stairs? N  Dressing or bathing? N  Doing errands, shopping? N  Preparing Food and eating ? N  Using the Toilet? N  In the past six months, have you accidently leaked urine? N  Do you have problems with loss of bowel control? N  Managing your Medications? N  Managing your Finances? N  Housekeeping or managing your Housekeeping? N  Some recent data might be hidden    Patient Care Team: [name], MD as PCP - General    Assessment:     Exercise Activities and Dietary recommendations Current Exercise Habits: Home exercise routine  Goals    . Exercise 150 minutes per  week (moderate activity)          Will try to join silver sneakers this fall Keep busy       Fall Risk Fall Risk  01/23/2017 01/12/2017 05/01/2016 12/27/2015 10/21/2014  Falls in the past year? No No Yes No No  Number falls in past yr: - - 1 - -  Injury with Fall? - - Yes - -  Follow up - - Education provided;Falls evaluation completed - -   Depression Screen PHQ 2/9 Scores 01/23/2017 01/12/2017 12/27/2015 10/21/2014  PHQ - 2 Score 0 0 0 0     Cognitive Function MMSE - Mini Mental State Exam 01/23/2017  Not completed: (No Data)    Ad8 score is 0  Diabetic Foot Exam - Simple   Simple Foot Form Diabetic Foot exam was performed with the following findings:  Yes 01/23/2017 10:45 AM  Visual Inspection No deformities, no ulcerations, no other skin breakdown bilaterally:  Yes Sensation Testing Intact to touch and monofilament testing bilaterally:  Yes Pulse Check See comments:  Yes Comments Foot exam; completed and cuts her own toenails; no issues  No foot pain, no tingling and burning  Pulses difficult to palpate but foot warm and dry. No callus or painful issues note; no burning Sensation very good         Immunization History  Administered Date(s) Administered  . Influenza Split 09/01/2011, 08/11/2013  . Influenza Whole 08/06/2009  . Influenza, High Dose Seasonal PF 10/26/2015, 10/23/2016  . Influenza,inj,Quad PF,36+ Mos 10/21/2014  . Pneumococcal Conjugate-13 10/21/2014  . Pneumococcal Polysaccharide-23 02/25/2009  . Td 11/06/2002   Screening Tests Health Maintenance  Topic Date Due  . DEXA SCAN  02/11/2007  . TETANUS/TDAP  11/06/2012  . COLONOSCOPY  11/07/2015  . FOOT EXAM  04/13/2016  . OPHTHALMOLOGY EXAM  07/20/2016  . HEMOGLOBIN A1C  04/23/2017  . MAMMOGRAM  07/24/2018  . INFLUENZA VACCINE  Completed  . PNA vac Low Risk Adult  Completed      Plan:    PCP Notes  Health Maintenance -Declined AD but family understands directives  -Educated on the tdap;  may take at pharmacy or plan to take her next   visit to see Dr. Elease Hashimoto   Abnormal Screens  None Stated she was not sure of calcium dosage Recommended 1200 per day in food or supplement Triglycerides; 337 given information on appropriate fats in diet;  Stated she was taking fish oil and will start taking fish oil again.   Referrals Agrees to outreach Dr. Collene Mares to fup on colonoscopy. Was busy caring for her dtr last year  (Dr. Lorie Apley office contacted and stated her colonoscopy is due in June this year; will fax the last report)   Patient concerns; currently had pink eye today; Dr. Elease Hashimoto called in drops and will call for apt if this does not resolve. Hearing 2000 in both ears but has a cold today  Nurse Concerns; BP elevated; states she has "white coach" at the [place] office   Next PCP apt 06/11   During the course of the visit the patient was educated and counseled about the following appropriate screening and preventive services:   Vaccines to include Pneumoccal, Influenza, Hepatitis B, Td, Zostavax, HCV  Electrocardiogram  Cardiovascular Disease/ bp elevated today; monitors at home   Colorectal cancer screening Dr. Collene Mares in process of faxing report  Bone density screening ordered today  Diabetes screening   Glaucoma screening- eye exam to be scheduled  Mammography/ To schedule with Dexa   Nutrition counseling - yes  Discussed strategies to lose weight;  Agreed to start exercising this fall at the silver sneaker program   Patient Instructions (the written plan) was given to the patient.   HMCNO,BSJGG, RN  01/23/2017  Agree with above assessment and recommendations as per Wynetta Fines, RN.  Would definitely benefit from losing some weight.  [name] MD [facility]

## 2017-01-23 NOTE — Patient Instructions (Addendum)
Brenda Patterson , Thank you for taking time to come for your Medicare Wellness Visit. I appreciate your ongoing commitment to your health goals. Please review the following plan we discussed and let me know if I can assist you in the future.   Just to note; has discussed AD with the family  Will have dexa scan when you have your next mammogram   A Tetanus is recommended every 10 years. Medicare covers a tetanus if you have a cut or wound; otherwise, there may be a charge. If you had not had a tetanus with pertusses, known as the Tdap, you can take this anytime.  Try to take at the pharmacy or with Dr. Elease Hashimoto at next OV  Will outreach Dr. Collene Mares for scheduling your colonoscopy      These are the goals we discussed: Goals    . Exercise 150 minutes per week (moderate activity)          Will try to join silver sneakers this fall Keep busy        This is a list of the screening recommended for you and due dates:  Health Maintenance  Topic Date Due  . DEXA scan (bone density measurement)  02/11/2007  . Tetanus Vaccine  11/06/2012  . Colon Cancer Screening  11/07/2015  . Complete foot exam   04/13/2016  . Eye exam for diabetics  07/20/2016  . Hemoglobin A1C  04/23/2017  . Mammogram  07/24/2018  . Flu Shot  Completed  . Pneumonia vaccines  Completed       Bone Densitometry Bone densitometry is an imaging test that uses a special X-ray to measure the amount of calcium and other minerals in your bones (bone density). This test is also known as a bone mineral density test or dual-energy X-ray absorptiometry (DXA). The test can measure bone density at your hip and your spine. It is similar to having a regular X-ray. You may have this test to:  Diagnose a condition that causes weak or thin bones (osteoporosis).  Predict your risk of a broken bone (fracture).  Determine how well osteoporosis treatment is working. Tell a health care provider about:  Any allergies you  have.  All medicines you are taking, including vitamins, herbs, eye drops, creams, and over-the-counter medicines.  Any problems you or family members have had with anesthetic medicines.  Any blood disorders you have.  Any surgeries you have had.  Any medical conditions you have.  Possibility of pregnancy.  Any other medical test you had within the previous 14 days that used contrast material. What are the risks? Generally, this is a safe procedure. However, problems can occur and may include the following:  This test exposes you to a very small amount of radiation.  The risks of radiation exposure may be greater to unborn children. What happens before the procedure?  Do not take any calcium supplements for 24 hours before having the test. You can otherwise eat and drink what you usually do.  Take off all metal jewelry, eyeglasses, dental appliances, and any other metal objects. What happens during the procedure?  You may lie on an exam table. There will be an X-ray generator below you and an imaging device above you.  Other devices, such as boxes or braces, may be used to position your body properly for the scan.  You will need to lie still while the machine slowly scans your body.  The images will show up on a computer monitor. What happens  after the procedure? You may need more testing at a later time. This information is not intended to replace advice given to you by your health care provider. Make sure you discuss any questions you have with your health care provider. Document Released: 11/14/2004 Document Revised: 03/30/2016 Document Reviewed: 04/02/2014 Elsevier Interactive Patient Education  2017 [employer].   Diabetes and Foot Care Diabetes may cause you to have problems because of poor blood supply (circulation) to your feet and legs. This may cause the skin on your feet to become thinner, break easier, and heal more slowly. Your skin may become dry, and the  skin may peel and crack. You may also have nerve damage in your legs and feet causing decreased feeling in them. You may not notice minor injuries to your feet that could lead to infections or more serious problems. Taking care of your feet is one of the most important things you can do for yourself. Follow these instructions at home:  Wear shoes at all times, even in the house. Do not go barefoot. Bare feet are easily injured.  Check your feet daily for blisters, cuts, and redness. If you cannot see the bottom of your feet, use a mirror or ask someone for help.  Wash your feet with warm water (do not use hot water) and mild soap. Then pat your feet and the areas between your toes until they are completely dry. Do not soak your feet as this can dry your skin.  Apply a moisturizing lotion or petroleum jelly (that does not contain alcohol and is unscented) to the skin on your feet and to dry, brittle toenails. Do not apply lotion between your toes.  Trim your toenails straight across. Do not dig under them or around the cuticle. File the edges of your nails with an emery board or nail file.  Do not cut corns or calluses or try to remove them with medicine.  Wear clean socks or stockings every day. Make sure they are not too tight. Do not wear knee-high stockings since they may decrease blood flow to your legs.  Wear shoes that fit properly and have enough cushioning. To break in new shoes, wear them for just a few hours a day. This prevents you from injuring your feet. Always look in your shoes before you put them on to be sure there are no objects inside.  Do not cross your legs. This may decrease the blood flow to your feet.  If you find a minor scrape, cut, or break in the skin on your feet, keep it and the skin around it clean and dry. These areas may be cleansed with mild soap and water. Do not cleanse the area with peroxide, alcohol, or iodine.  When you remove an adhesive bandage, be sure  not to damage the skin around it.  If you have a wound, look at it several times a day to make sure it is healing.  Do not use heating pads or hot water bottles. They may burn your skin. If you have lost feeling in your feet or legs, you may not know it is happening until it is too late.  Make sure your health care provider performs a complete foot exam at least annually or more often if you have foot problems. Report any cuts, sores, or bruises to your health care provider immediately. Contact a health care provider if:  You have an injury that is not healing.  You have cuts or breaks in  the skin.  You have an ingrown nail.  You notice redness on your legs or feet.  You feel burning or tingling in your legs or feet.  You have pain or cramps in your legs and feet.  Your legs or feet are numb.  Your feet always feel cold. Get help right away if:  There is increasing redness, swelling, or pain in or around a wound.  There is a red line that goes up your leg.  Pus is coming from a wound.  You develop a fever or as directed by your health care provider.  You notice a bad smell coming from an ulcer or wound. This information is not intended to replace advice given to you by your health care provider. Make sure you discuss any questions you have with your health care provider. Document Released: 10/20/2000 Document Revised: 03/30/2016 Document Reviewed: 04/01/2013 Elsevier Interactive Patient Education  2017 [place] Prevention in the Home Falls can cause injuries. They can happen to people of all ages. There are many things you can do to make your home safe and to help prevent falls. What can I do on the outside of my home?  Regularly fix the edges of walkways and driveways and fix any cracks.  Remove anything that might make you trip as you walk through a door, such as a raised step or threshold.  Trim any bushes or trees on the path to your home.  Use  bright outdoor lighting.  Clear any walking paths of anything that might make someone trip, such as rocks or tools.  Regularly check to see if handrails are loose or broken. Make sure that both sides of any steps have handrails.  Any raised decks and porches should have guardrails on the edges.  Have any leaves, snow, or ice cleared regularly.  Use sand or salt on walking paths during winter.  Clean up any spills in your garage right away. This includes oil or grease spills. What can I do in the bathroom?  Use night lights.  Install grab bars by the toilet and in the tub and shower. Do not use towel bars as grab bars.  Use non-skid mats or decals in the tub or shower.  If you need to sit down in the shower, use a plastic, non-slip stool.  Keep the floor dry. Clean up any water that spills on the floor as soon as it happens.  Remove soap buildup in the tub or shower regularly.  Attach bath mats securely with double-sided non-slip rug tape.  Do not have throw rugs and other things on the floor that can make you trip. What can I do in the bedroom?  Use night lights.  Make sure that you have a light by your bed that is easy to reach.  Do not use any sheets or blankets that are too big for your bed. They should not hang down onto the floor.  Have a firm chair that has side arms. You can use this for support while you get dressed.  Do not have throw rugs and other things on the floor that can make you trip. What can I do in the kitchen?  Clean up any spills right away.  Avoid walking on wet floors.  Keep items that you use a lot in easy-to-reach places.  If you need to reach something above you, use a strong step stool that has a grab bar.  Keep electrical cords out of the way.  Do not use floor polish or wax that makes floors slippery. If you must use wax, use non-skid floor wax.  Do not have throw rugs and other things on the floor that can make you trip. What can  I do with my stairs?  Do not leave any items on the stairs.  Make sure that there are handrails on both sides of the stairs and use them. Fix handrails that are broken or loose. Make sure that handrails are as long as the stairways.  Check any carpeting to make sure that it is firmly attached to the stairs. Fix any carpet that is loose or worn.  Avoid having throw rugs at the top or bottom of the stairs. If you do have throw rugs, attach them to the floor with carpet tape.  Make sure that you have a light switch at the top of the stairs and the bottom of the stairs. If you do not have them, ask someone to add them for you. What else can I do to help prevent falls?  Wear shoes that:  Do not have high heels.  Have rubber bottoms.  Are comfortable and fit you well.  Are closed at the toe. Do not wear sandals.  If you use a stepladder:  Make sure that it is fully opened. Do not climb a closed stepladder.  Make sure that both sides of the stepladder are locked into place.  Ask someone to hold it for you, if possible.  Clearly mark and make sure that you can see:  Any grab bars or handrails.  First and last steps.  Where the edge of each step is.  Use tools that help you move around (mobility aids) if they are needed. These include:  Canes.  Walkers.  Scooters.  Crutches.  Turn on the lights when you go into a dark area. Replace any light bulbs as soon as they burn out.  Set up your furniture so you have a clear path. Avoid moving your furniture around.  If any of your floors are uneven, fix them.  If there are any pets around you, be aware of where they are.  Review your medicines with your doctor. Some medicines can make you feel dizzy. This can increase your chance of falling. Ask your doctor what other things that you can do to help prevent falls. This information is not intended to replace advice given to you by your health care provider. Make sure you  discuss any questions you have with your health care provider. Document Released: 08/19/2009 Document Revised: 03/30/2016 Document Reviewed: 11/27/2014 Elsevier Interactive Patient Education  2017 [place] Maintenance, Female Adopting a healthy lifestyle and getting preventive care can go a long way to promote health and wellness. Talk with your health care provider about what schedule of regular examinations is right for you. This is a good chance for you to check in with your provider about disease prevention and staying healthy. In between checkups, there are plenty of things you can do on your own. Experts have done a lot of research about which lifestyle changes and preventive measures are most likely to keep you healthy. Ask your health care provider for more information. Weight and diet Eat a healthy diet  Be sure to include plenty of vegetables, fruits, low-fat dairy products, and lean protein.  Do not eat a lot of foods high in solid fats, added sugars, or salt.  Get regular exercise. This is one of the most important  things you can do for your health.  Most adults should exercise for at least 150 minutes each week. The exercise should increase your heart rate and make you sweat (moderate-intensity exercise).  Most adults should also do strengthening exercises at least twice a week. This is in addition to the moderate-intensity exercise. Maintain a healthy weight  Body mass index (BMI) is a measurement that can be used to identify possible weight problems. It estimates body fat based on height and weight. Your health care provider can help determine your BMI and help you achieve or maintain a healthy weight.  For females 42 years of age and older:  A BMI below 18.5 is considered underweight.  A BMI of 18.5 to 24.9 is normal.  A BMI of 25 to 29.9 is considered overweight.  A BMI of 30 and above is considered obese. Watch levels of cholesterol and blood  lipids  You should start having your blood tested for lipids and cholesterol at 75 years of age, then have this test every 5 years.  You may need to have your cholesterol levels checked more often if:  Your lipid or cholesterol levels are high.  You are older than 75 years of age.  You are at high risk for heart disease. Cancer screening Lung Cancer  Lung cancer screening is recommended for adults 90-49 years old who are at high risk for lung cancer because of a history of smoking.  A yearly low-dose CT scan of the lungs is recommended for people who:  Currently smoke.  Have quit within the past 15 years.  Have at least a 30-pack-year history of smoking. A pack year is smoking an average of one pack of cigarettes a day for 1 year.  Yearly screening should continue until it has been 15 years since you quit.  Yearly screening should stop if you develop a health problem that would prevent you from having lung cancer treatment. Breast Cancer  Practice breast self-awareness. This means understanding how your breasts normally appear and feel.  It also means doing regular breast self-exams. Let your health care provider know about any changes, no matter how small.  If you are in your 20s or 30s, you should have a clinical breast exam (CBE) by a health care provider every 1-3 years as part of a regular health exam.  If you are 44 or older, have a CBE every year. Also consider having a breast X-ray (mammogram) every year.  If you have a family history of breast cancer, talk to your health care provider about genetic screening.  If you are at high risk for breast cancer, talk to your health care provider about having an MRI and a mammogram every year.  Breast cancer gene (BRCA) assessment is recommended for women who have family members with BRCA-related cancers. BRCA-related cancers include:  Breast.  Ovarian.  Tubal.  Peritoneal cancers.  Results of the assessment will  determine the need for genetic counseling and BRCA1 and BRCA2 testing. Cervical Cancer  Your health care provider may recommend that you be screened regularly for cancer of the pelvic organs (ovaries, uterus, and vagina). This screening involves a pelvic examination, including checking for microscopic changes to the surface of your cervix (Pap test). You may be encouraged to have this screening done every 3 years, beginning at age 6.  For women ages 103-65, health care providers may recommend pelvic exams and Pap testing every 3 years, or they may recommend the Pap and pelvic exam, combined  with testing for human papilloma virus (HPV), every 5 years. Some types of HPV increase your risk of cervical cancer. Testing for HPV may also be done on women of any age with unclear Pap test results.  Other health care providers may not recommend any screening for nonpregnant women who are considered low risk for pelvic cancer and who do not have symptoms. Ask your health care provider if a screening pelvic exam is right for you.  If you have had past treatment for cervical cancer or a condition that could lead to cancer, you need Pap tests and screening for cancer for at least 20 years after your treatment. If Pap tests have been discontinued, your risk factors (such as having a new sexual partner) need to be reassessed to determine if screening should resume. Some women have medical problems that increase the chance of getting cervical cancer. In these cases, your health care provider may recommend more frequent screening and Pap tests. Colorectal Cancer  This type of cancer can be detected and often prevented.  Routine colorectal cancer screening usually begins at 75 years of age and continues through 75 years of age.  Your health care provider may recommend screening at an earlier age if you have risk factors for colon cancer.  Your health care provider may also recommend using home test kits to check for  hidden blood in the stool.  A small camera at the end of a tube can be used to examine your colon directly (sigmoidoscopy or colonoscopy). This is done to check for the earliest forms of colorectal cancer.  Routine screening usually begins at age 57.  Direct examination of the colon should be repeated every 5-10 years through 75 years of age. However, you may need to be screened more often if early forms of precancerous polyps or small growths are found. Skin Cancer  Check your skin from head to toe regularly.  Tell your health care provider about any new moles or changes in moles, especially if there is a change in a mole's shape or color.  Also tell your health care provider if you have a mole that is larger than the size of a pencil eraser.  Always use sunscreen. Apply sunscreen liberally and repeatedly throughout the day.  Protect yourself by wearing long sleeves, pants, a wide-brimmed hat, and sunglasses whenever you are outside. Heart disease, diabetes, and high blood pressure  High blood pressure causes heart disease and increases the risk of stroke. High blood pressure is more likely to develop in:  People who have blood pressure in the high end of the normal range (130-139/85-89 mm Hg).  People who are overweight or obese.  People who are African American.  If you are 34-61 years of age, have your blood pressure checked every 3-5 years. If you are 16 years of age or older, have your blood pressure checked every year. You should have your blood pressure measured twice-once when you are at a hospital or clinic, and once when you are not at a hospital or clinic. Record the average of the two measurements. To check your blood pressure when you are not at a hospital or clinic, you can use:  An automated blood pressure machine at a pharmacy.  A home blood pressure monitor.  If you are between 60 years and 60 years old, ask your health care provider if you should take aspirin to  prevent strokes.  Have regular diabetes screenings. This involves taking a blood sample to check  your fasting blood sugar level.  If you are at a normal weight and have a low risk for diabetes, have this test once every three years after 75 years of age.  If you are overweight and have a high risk for diabetes, consider being tested at a younger age or more often. Preventing infection Hepatitis B  If you have a higher risk for hepatitis B, you should be screened for this virus. You are considered at high risk for hepatitis B if:  You were born in a country where hepatitis B is common. Ask your health care provider which countries are considered high risk.  Your parents were born in a high-risk country, and you have not been immunized against hepatitis B (hepatitis B vaccine).  You have HIV or AIDS.  You use needles to inject street drugs.  You live with someone who has hepatitis B.  You have had sex with someone who has hepatitis B.  You get hemodialysis treatment.  You take certain medicines for conditions, including cancer, organ transplantation, and autoimmune conditions. Hepatitis C  Blood testing is recommended for:  Everyone born from [birth year] through 1964.  Anyone with known risk factors for hepatitis C. Sexually transmitted infections (STIs)  You should be screened for sexually transmitted infections (STIs) including gonorrhea and chlamydia if:  You are sexually active and are younger than 75 years of age.  You are older than 75 years of age and your health care provider tells you that you are at risk for this type of infection.  Your sexual activity has changed since you were last screened and you are at an increased risk for chlamydia or gonorrhea. Ask your health care provider if you are at risk.  If you do not have HIV, but are at risk, it may be recommended that you take a prescription medicine daily to prevent HIV infection. This is called pre-exposure  prophylaxis (PrEP). You are considered at risk if:  You are sexually active and do not regularly use condoms or know the HIV status of your partner(s).  You take drugs by injection.  You are sexually active with a partner who has HIV. Talk with your health care provider about whether you are at high risk of being infected with HIV. If you choose to begin PrEP, you should first be tested for HIV. You should then be tested every 3 months for as long as you are taking PrEP. Pregnancy  If you are premenopausal and you may become pregnant, ask your health care provider about preconception counseling.  If you may become pregnant, take 400 to 800 micrograms (mcg) of folic acid every day.  If you want to prevent pregnancy, talk to your health care provider about birth control (contraception). Osteoporosis and menopause  Osteoporosis is a disease in which the bones lose minerals and strength with aging. This can result in serious bone fractures. Your risk for osteoporosis can be identified using a bone density scan.  If you are 50 years of age or older, or if you are at risk for osteoporosis and fractures, ask your health care provider if you should be screened.  Ask your health care provider whether you should take a calcium or vitamin D supplement to lower your risk for osteoporosis.  Menopause may have certain physical symptoms and risks.  Hormone replacement therapy may reduce some of these symptoms and risks. Talk to your health care provider about whether hormone replacement therapy is right for you. Follow these instructions at  home:  Schedule regular health, dental, and eye exams.  Stay current with your immunizations.  Do not use any tobacco products including cigarettes, chewing tobacco, or electronic cigarettes.  If you are pregnant, do not drink alcohol.  If you are breastfeeding, limit how much and how often you drink alcohol.  Limit alcohol intake to no more than 1 drink  per day for nonpregnant women. One drink equals 12 ounces of beer, 5 ounces of wine, or 1 ounces of hard liquor.  Do not use street drugs.  Do not share needles.  Ask your health care provider for help if you need support or information about quitting drugs.  Tell your health care provider if you often feel depressed.  Tell your health care provider if you have ever been abused or do not feel safe at home. This information is not intended to replace advice given to you by your health care provider. Make sure you discuss any questions you have with your health care provider. Document Released: 05/08/2011 Document Revised: 03/30/2016 Document Reviewed: 07/27/2015 Elsevier Interactive Patient Education  2017 [employer].

## 2017-01-24 ENCOUNTER — Other Ambulatory Visit: Payer: Self-pay | Admitting: Family Medicine

## 2017-02-03 ENCOUNTER — Other Ambulatory Visit: Payer: Self-pay | Admitting: Family Medicine

## 2017-02-06 ENCOUNTER — Other Ambulatory Visit: Payer: Self-pay | Admitting: Family Medicine

## 2017-02-07 ENCOUNTER — Other Ambulatory Visit: Payer: Self-pay | Admitting: Family Medicine

## 2017-03-16 ENCOUNTER — Other Ambulatory Visit: Payer: Self-pay | Admitting: *Deleted

## 2017-03-16 MED ORDER — NIFEDIPINE ER OSMOTIC RELEASE 30 MG PO TB24: 30.0000 mg | ORAL_TABLET | Freq: Two times a day (BID) | ORAL | 1 refills | Status: DC

## 2017-03-16 MED ORDER — LISINOPRIL 20 MG PO TABS: 20.0000 mg | ORAL_TABLET | Freq: Every day | ORAL | 1 refills | Status: DC

## 2017-03-16 NOTE — Telephone Encounter (Signed)
Rx done. 

## 2017-03-19 ENCOUNTER — Other Ambulatory Visit: Payer: Self-pay | Admitting: *Deleted

## 2017-03-19 MED ORDER — NIFEDIPINE ER OSMOTIC RELEASE 30 MG PO TB24: 30.0000 mg | ORAL_TABLET | Freq: Two times a day (BID) | ORAL | 1 refills | Status: DC

## 2017-03-19 MED ORDER — LISINOPRIL 20 MG PO TABS: 20.0000 mg | ORAL_TABLET | Freq: Every day | ORAL | 1 refills | Status: DC

## 2017-04-16 ENCOUNTER — Ambulatory Visit: Payer: Medicare Other | Admitting: Family Medicine

## 2017-04-23 ENCOUNTER — Ambulatory Visit (INDEPENDENT_AMBULATORY_CARE_PROVIDER_SITE_OTHER): Payer: Medicare Other | Admitting: Family Medicine

## 2017-04-23 ENCOUNTER — Encounter: Payer: Self-pay | Admitting: Family Medicine

## 2017-04-23 VITALS — BP 132/70 | HR 71 | Temp 98.3°F | Wt 202.6 lb

## 2017-04-23 DIAGNOSIS — I10 Essential (primary) hypertension: Secondary | ICD-10-CM

## 2017-04-23 DIAGNOSIS — E119 Type 2 diabetes mellitus without complications: Secondary | ICD-10-CM

## 2017-04-23 DIAGNOSIS — N183 Chronic kidney disease, stage 3 unspecified: Secondary | ICD-10-CM

## 2017-04-23 DIAGNOSIS — E785 Hyperlipidemia, unspecified: Secondary | ICD-10-CM

## 2017-04-23 LAB — POCT GLYCOSYLATED HEMOGLOBIN (HGB A1C): Hemoglobin A1C: 6.4

## 2017-04-23 NOTE — Progress Notes (Signed)
Subjective:     Patient ID: Brenda Patterson, female   DOB: [birth year], 76 y.o.   MRN: [id number]  HPI Patient here for medical follow-up. Her chronic prompt include history of obesity, type 2 diabetes, chronic kidney disease, hyperlipidemia, hypertension. Medications reviewed. Compliant with all. Denies any side effects.  She has tried to scale back carbohydrate intake since last visit though her weight is essentially unchanged. Last A1c 7.1%. Denies recent hypoglycemia. No consistent exercise. No recent chest pains.  Blood pressures been stable by home readings. No headaches.  She has chronic kidney disease with baseline creatinine around 1.4.  Past Medical History:  Diagnosis Date  . Allergy   . Arthritis   . 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 Diabetes mellitus   . Hyperlipidemia   . Hypertension   . Migraines    Past Surgical History:  Procedure Laterality Date  . CHOLECYSTECTOMY    . TUBAL LIGATION      reports that she has never smoked. She has never used smokeless tobacco. She reports that she does not drink alcohol or use drugs. family history is not on file. Allergies  Allergen Reactions  . Azithromycin Nausea Only     Review of Systems  Constitutional: Negative for fatigue.  Eyes: Negative for visual disturbance.  Respiratory: Negative for cough, chest tightness, shortness of breath and wheezing.   Cardiovascular: Negative for chest pain, palpitations and leg swelling.  Endocrine: Negative for polydipsia and polyuria.  Genitourinary: Negative for dysuria.  Neurological: Negative for dizziness, seizures, syncope, weakness, light-headedness and headaches.       Objective:   Physical Exam  Constitutional: She appears well-developed and well-nourished.  Eyes: Pupils are equal, round, and reactive to light.  Neck: Neck supple. No JVD present. No thyromegaly present.  Cardiovascular: Normal rate and regular rhythm.  Exam reveals no gallop.    Pulmonary/Chest: Effort normal and breath sounds normal. No respiratory distress. She has no wheezes. She has no rales.  Musculoskeletal: She exhibits no edema.  Neurological: She is alert.  Psychiatric: She has a normal mood and affect. Her behavior is normal.       Assessment:     # 1  Improved with A1c 6.4%  #2 hypertension stable  #3 dyslipidemia  #4 chronic kidney disease    Plan:     -Continue current medications -She is encouraged to try to lose some weight -She is already been scheduled for DEXA scan -Routine follow-up in 6 months and sooner as needed  [name] MD [facility] at [facility]

## 2017-07-30 ENCOUNTER — Other Ambulatory Visit: Payer: Self-pay | Admitting: Family Medicine

## 2017-07-31 ENCOUNTER — Other Ambulatory Visit: Payer: Self-pay | Admitting: Family Medicine

## 2017-07-31 DIAGNOSIS — Z1231 Encounter for screening mammogram for malignant neoplasm of breast: Secondary | ICD-10-CM

## 2017-08-21 ENCOUNTER — Other Ambulatory Visit: Payer: Medicare Other

## 2017-08-21 ENCOUNTER — Ambulatory Visit: Payer: Medicare Other

## 2017-09-06 ENCOUNTER — Ambulatory Visit
Admission: RE | Admit: 2017-09-06 | Discharge: 2017-09-06 | Disposition: A | Discharge status: Home / Self Care | Payer: Medicare Other | Source: Ambulatory Visit | Attending: Family Medicine | Admitting: Family Medicine

## 2017-09-06 DIAGNOSIS — M85851 Other specified disorders of bone density and structure, right thigh: Secondary | ICD-10-CM | POA: Diagnosis not present

## 2017-09-06 DIAGNOSIS — Z1231 Encounter for screening mammogram for malignant neoplasm of breast: Secondary | ICD-10-CM

## 2017-09-06 DIAGNOSIS — E2839 Other primary ovarian failure: Secondary | ICD-10-CM

## 2017-09-13 ENCOUNTER — Encounter: Payer: Self-pay | Admitting: Family Medicine

## 2017-09-13 ENCOUNTER — Ambulatory Visit (INDEPENDENT_AMBULATORY_CARE_PROVIDER_SITE_OTHER): Payer: Medicare Other | Admitting: Family Medicine

## 2017-09-13 VITALS — BP 124/68 | HR 78 | Temp 98.1°F | Wt 205.3 lb

## 2017-09-13 DIAGNOSIS — Z23 Encounter for immunization: Secondary | ICD-10-CM

## 2017-09-13 DIAGNOSIS — J0100 Acute maxillary sinusitis, unspecified: Secondary | ICD-10-CM

## 2017-09-13 MED ORDER — AMOXICILLIN-POT CLAVULANATE 500-125 MG PO TABS: 1.0000 | ORAL_TABLET | Freq: Two times a day (BID) | ORAL | 0 refills | Status: AC

## 2017-09-13 NOTE — Patient Instructions (Addendum)

## 2017-09-13 NOTE — Progress Notes (Signed)
Subjective:    Patient ID: Brenda Patterson, female    DOB: [birth year], 76 y.o.   MRN: [id number]  No chief complaint on file.   HPI Patient was seen today for acute concern.  Patient endorses 2 weeks of ear pain, headache, nasal congestion, and most recently cough.  Patient endorses sinus pressure behind the ears and at mastoid processes.  Patient also endorses swollen glands in her neck, so much so that she rubbed BenGay on her neck.  Patient has also tried Mucinex and Chloraseptic spray.  Endorses questionable chills and postnasal drip.  Denies fever, rhinorrhea.  Past Medical History:  Diagnosis Date  . Allergy   . Arthritis   . 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 Diabetes mellitus   . Hyperlipidemia   . Hypertension   . Migraines     Allergies  Allergen Reactions  . Azithromycin Nausea Only    ROS     Objective:    Blood pressure 124/68, pulse 78, temperature 98.1 F (36.7 C), temperature source Oral, weight 205 lb 4.8 oz (93.1 kg), SpO2 98 %.   Gen. Pleasant, well-nourished, in no distress, normal affect  HEENT: [place]/AT, face symmetric, no scleral icterus, PERRLA, EOMI, nares patent with some drainage, pharynx without erythema or exudate. Neck: No JVD, no thyromegaly, cervical lymphadenopathy present, tenderness of mastoid processes b/l Lungs: no accessory muscle use, CTAB, no wheezes or rales Cardiovascular: RRR, no m/r/g, no peripheral edema Abdomen: BS present, soft, NT/ND.   Wt Readings from Last 3 Encounters:  09/13/17 205 lb 4.8 oz (93.1 kg)  04/23/17 202 lb 9.6 oz (91.9 kg)  01/23/17 201 lb 7 oz (91.4 kg)    Lab Results  Component Value Date   WBC 7.0 05/01/2016   HGB 10.8 (L) 05/01/2016   HCT 32.3 (L) 05/01/2016   PLT 252.0 05/01/2016   GLUCOSE 174 (H) 12/01/2016   CHOL 178 05/01/2016   TRIG 337.0 (H) 05/01/2016   HDL 37.50 (L) 05/01/2016   LDLDIRECT 108.0 05/01/2016   LDLCALC 90 04/03/2014   ALT 16 05/01/2016   AST 16  05/01/2016   NA 139 12/01/2016   K 4.4 12/01/2016   CL 103 12/01/2016   CREATININE 1.47 (H) 12/01/2016   BUN 27 (H) 12/01/2016   CO2 28 12/01/2016   HGBA1C 6.4 04/23/2017    Assessment/Plan:  Acute non-recurrent maxillary sinusitis  -Okay to use Tylenol as needed for pain/discomfort -Discussed supportive care - Plan: amoxicillin-clavulanate (AUGMENTIN) 500-125 MG tablet -Given RTC precautions.  Patient inquired about influenza vaccine, however decided to wait until feeling better to receive the vaccine.

## 2017-10-22 ENCOUNTER — Other Ambulatory Visit: Payer: Self-pay | Admitting: Family Medicine

## 2017-10-22 ENCOUNTER — Ambulatory Visit (INDEPENDENT_AMBULATORY_CARE_PROVIDER_SITE_OTHER): Payer: Medicare Other | Admitting: Family Medicine

## 2017-10-22 ENCOUNTER — Encounter: Payer: Self-pay | Admitting: Family Medicine

## 2017-10-22 VITALS — BP 130/70 | HR 71 | Temp 98.4°F | Wt 202.0 lb

## 2017-10-22 DIAGNOSIS — E119 Type 2 diabetes mellitus without complications: Secondary | ICD-10-CM | POA: Diagnosis not present

## 2017-10-22 DIAGNOSIS — I10 Essential (primary) hypertension: Secondary | ICD-10-CM

## 2017-10-22 DIAGNOSIS — M8580 Other specified disorders of bone density and structure, unspecified site: Secondary | ICD-10-CM

## 2017-10-22 DIAGNOSIS — Z23 Encounter for immunization: Secondary | ICD-10-CM

## 2017-10-22 LAB — POCT GLYCOSYLATED HEMOGLOBIN (HGB A1C): Hemoglobin A1C: 6.8

## 2017-10-22 NOTE — Patient Instructions (Signed)
Check on coverage for Tdap- and let us know if interested.

## 2017-10-22 NOTE — Progress Notes (Signed)
Subjective:     Patient ID: Brenda Patterson, female   DOB: Oct 15, 1941, 75 y.o.   MRN: [id number]  HPI Patient here to discuss several issues as follows  Needs flu vaccine. She also is overdue for tetanus. She has a new great grandchild on the way. She's not sure if her insurance covers for tetanus and acellular pertussis vaccine.  Hypertension treated with multidrug regimen. No dizziness. No headaches. Blood pressures been well controlled by home readings. Compliant with all medications.  Type 2 diabetes. History of good control. Last A1c 6.4%. Poor compliance with diet recently. Denies any polyuria or polydipsia  History of osteopenia by recent DEXA.  Her FRAX score though predicted 10 year risk of hip fracture a little over 12%. This was probably based in part on the fact that her mother had history of hip fracture and also patient has previous history of radial fracture from trauma.  Past Medical History:  Diagnosis Date  . Allergy   . Arthritis   . 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 Diabetes mellitus   . Hyperlipidemia   . Hypertension   . Migraines    Past Surgical History:  Procedure Laterality Date  . CHOLECYSTECTOMY    . TUBAL LIGATION      reports that  has never smoked. she has never used smokeless tobacco. She reports that she does not drink alcohol or use drugs. family history includes Arthritis in her unknown relative; Diabetes in her unknown relative; Hyperlipidemia in her unknown relative; Hypertension in her unknown relative; Stroke in her unknown relative. Allergies  Allergen Reactions  . Azithromycin Nausea Only  . Heparin     Patient states it makes her weak     Review of Systems  Constitutional: Negative for activity change, appetite change, fatigue, fever and unexpected weight change.  HENT: Negative for trouble swallowing.   Eyes: Negative for visual disturbance.  Respiratory: Negative for shortness of breath.   Cardiovascular:  Negative for chest pain and palpitations.  Gastrointestinal: Negative for abdominal pain, blood in stool, constipation and diarrhea.  Endocrine: Negative for polydipsia and polyuria.  Genitourinary: Negative for dysuria and hematuria.  Skin: Negative for rash.  Neurological: Negative for dizziness, syncope and headaches.  Hematological: Negative for adenopathy.  Psychiatric/Behavioral: Negative for confusion and dysphoric mood.       Objective:   Physical Exam  Constitutional: She appears well-developed and well-nourished.  Eyes: Pupils are equal, round, and reactive to light.  Neck: Neck supple. No JVD present. No thyromegaly present.  Cardiovascular: Normal rate and regular rhythm. Exam reveals no gallop.  Pulmonary/Chest: Effort normal and breath sounds normal. No respiratory distress. She has no wheezes. She has no rales.  Musculoskeletal: She exhibits no edema.  Neurological: She is alert.       Assessment:     #1 hypertension stable and at goal  #2 type 2 diabetes well controlled with A1c 6.8%  #3 osteopenia with increased FRAX score    Plan:     -continue current medications -Continue regular calcium and vitamin D supplementation -We discussed possible treatments for osteopenia- beyond calcium and vitamin D and she declines at this time. -Would definitely consider repeat bone density scan in 2 years -Check on insurance coverage for Tdap  [name] MD [facility]

## 2017-11-05 ENCOUNTER — Encounter (HOSPITAL_COMMUNITY): Payer: Self-pay | Admitting: Emergency Medicine

## 2017-11-05 ENCOUNTER — Other Ambulatory Visit: Payer: Self-pay

## 2017-11-05 ENCOUNTER — Emergency Department (HOSPITAL_COMMUNITY)
Admission: EM | Admit: 2017-11-05 | Discharge: 2017-11-05 | Disposition: A | Discharge status: Home / Self Care | Payer: Medicare Other | Attending: Emergency Medicine | Admitting: Emergency Medicine

## 2017-11-05 DIAGNOSIS — N183 Chronic kidney disease, stage 3 (moderate): Secondary | ICD-10-CM | POA: Insufficient documentation

## 2017-11-05 DIAGNOSIS — D631 Anemia in chronic kidney disease: Secondary | ICD-10-CM | POA: Diagnosis not present

## 2017-11-05 DIAGNOSIS — Z79899 Other long term (current) drug therapy: Secondary | ICD-10-CM | POA: Insufficient documentation

## 2017-11-05 DIAGNOSIS — E785 Hyperlipidemia, unspecified: Secondary | ICD-10-CM | POA: Insufficient documentation

## 2017-11-05 DIAGNOSIS — H81399 Other peripheral vertigo, unspecified ear: Secondary | ICD-10-CM | POA: Insufficient documentation

## 2017-11-05 DIAGNOSIS — J0111 Acute recurrent frontal sinusitis: Secondary | ICD-10-CM | POA: Diagnosis not present

## 2017-11-05 DIAGNOSIS — Z7984 Long term (current) use of oral hypoglycemic drugs: Secondary | ICD-10-CM | POA: Insufficient documentation

## 2017-11-05 DIAGNOSIS — E1122 Type 2 diabetes mellitus with diabetic chronic kidney disease: Secondary | ICD-10-CM | POA: Diagnosis not present

## 2017-11-05 DIAGNOSIS — I129 Hypertensive chronic kidney disease with stage 1 through stage 4 chronic kidney disease, or unspecified chronic kidney disease: Secondary | ICD-10-CM | POA: Diagnosis not present

## 2017-11-05 DIAGNOSIS — R42 Dizziness and giddiness: Secondary | ICD-10-CM | POA: Diagnosis present

## 2017-11-05 LAB — CBC WITH DIFFERENTIAL/PLATELET
Basophils Absolute: 0 10*3/uL (ref 0.0–0.1)
Basophils Relative: 0 %
Eosinophils Absolute: 0.6 10*3/uL (ref 0.0–0.7)
Eosinophils Relative: 7 %
HCT: 34.6 % — ABNORMAL LOW (ref 36.0–46.0)
Hemoglobin: 11.5 g/dL — ABNORMAL LOW (ref 12.0–15.0)
Lymphocytes Relative: 36 %
Lymphs Abs: 3.1 10*3/uL (ref 0.7–4.0)
MCH: 29.9 pg (ref 26.0–34.0)
MCHC: 33.2 g/dL (ref 30.0–36.0)
MCV: 89.9 fL (ref 78.0–100.0)
Monocytes Absolute: 0.6 10*3/uL (ref 0.1–1.0)
Monocytes Relative: 7 %
Neutro Abs: 4.2 10*3/uL (ref 1.7–7.7)
Neutrophils Relative %: 49 %
Platelets: 344 10*3/uL (ref 150–400)
RBC: 3.85 MIL/uL — ABNORMAL LOW (ref 3.87–5.11)
RDW: 12.7 % (ref 11.5–15.5)
WBC: 8.5 10*3/uL (ref 4.0–10.5)

## 2017-11-05 LAB — CBG MONITORING, ED: Glucose-Capillary: 200 mg/dL — ABNORMAL HIGH (ref 65–99)

## 2017-11-05 LAB — URINALYSIS, ROUTINE W REFLEX MICROSCOPIC
Bilirubin Urine: NEGATIVE
Glucose, UA: NEGATIVE mg/dL
Hgb urine dipstick: NEGATIVE
Ketones, ur: NEGATIVE mg/dL
Nitrite: NEGATIVE
Protein, ur: 100 mg/dL — AB
Specific Gravity, Urine: 1.019 (ref 1.005–1.030)
pH: 5 (ref 5.0–8.0)

## 2017-11-05 LAB — BASIC METABOLIC PANEL
Anion gap: 12 (ref 5–15)
BUN: 29 mg/dL — ABNORMAL HIGH (ref 6–20)
CO2: 25 mmol/L (ref 22–32)
Calcium: 9.3 mg/dL (ref 8.9–10.3)
Chloride: 103 mmol/L (ref 101–111)
Creatinine, Ser: 1.63 mg/dL — ABNORMAL HIGH (ref 0.44–1.00)
GFR calc Af Amer: 34 mL/min — ABNORMAL LOW (ref >60)
GFR calc non Af Amer: 30 mL/min — ABNORMAL LOW (ref >60)
Glucose, Bld: 112 mg/dL — ABNORMAL HIGH (ref 65–99)
Potassium: 4.5 mmol/L (ref 3.5–5.1)
Sodium: 140 mmol/L (ref 135–145)

## 2017-11-05 MED ORDER — FLUTICASONE PROPIONATE 50 MCG/ACT NA SUSP: 2.0000 | Freq: Every day | NASAL | 2 refills | Status: DC

## 2017-11-05 MED ORDER — DOXYCYCLINE HYCLATE 100 MG PO CAPS: 100.0000 mg | ORAL_CAPSULE | Freq: Two times a day (BID) | ORAL | 0 refills | Status: DC

## 2017-11-05 NOTE — ED Triage Notes (Signed)
Pt reports cold symptoms and congestion since November. Pt reports intermittent dizziness with room spinning. Pt denies any headache, changes in vision, numbness. Pt reports history of vertigo. nad noted.

## 2017-11-05 NOTE — ED Notes (Signed)
cbg in triage 200.

## 2017-11-05 NOTE — Discharge Instructions (Signed)

## 2017-11-05 NOTE — ED Provider Notes (Signed)
[facility] EMERGENCY DEPARTMENT Provider Note   CSN: [id number] Arrival date & time: 11/05/17  1120     History   Chief Complaint Chief Complaint  Patient presents with  . Dizziness    HPI Brenda Patterson is a 76 y.o. female.  HPI  Patient is a 76 year old female, she has a known history of allergies, she also has a history of diabetes hypertension and hyperlipidemia.  She presents to the hospital with a complaint of dizziness which she describes as the room spinning, she states this started several days ago and has been intermittent, worse with turning her head to the side and it usually resolves very shortly thereafter.  As a part of this illness she has been struggling with nasal congestion and drainage down the back of her throat for the better part of a month, she initially took a course of amoxicillin and felt like she improved a little bit but it never went away and now has worsened.  At night she is using Afrin to keep her nose open, she denies any chest pain shortness of breath or fevers.  She does not have an overt headache but feels a pressure across her forehead.  There is no ringing in the ears, she is not vertiginous at this time, she was able to walk into the hospital on her own accord without any assistance or imbalance.  Past Medical History:  Diagnosis Date  . Allergy   . Arthritis   . 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 Diabetes mellitus   . Hyperlipidemia   . Hypertension   . Migraines     Patient Active Problem List   Diagnosis Date Noted  . Osteopenia 10/22/2017  . Acute frontal sinusitis 11/11/2015  . CKD (chronic kidney disease) stage 3, GFR 30-59 ml/min (HCC) 10/26/2015  . Anemia of chronic disease   . PNA (pneumonia) 12/26/2014  . Nausea vomiting and diarrhea 01/13/2014  . Obesity (BMI 30-39.9) 09/25/2013  . INGROWN TOENAIL 06/16/2010  . Hyperlipidemia 10/27/2009  . History of depression 10/27/2009  . ANEMIA, HX OF  03/25/2009  . Type 2 diabetes mellitus, controlled ([place]) 02/25/2009  . Essential hypertension 02/25/2009  . ALLERGIC RHINITIS 02/25/2009    Past Surgical History:  Procedure Laterality Date  . CHOLECYSTECTOMY    . TUBAL LIGATION      OB History    No data available       Home Medications    Prior to Admission medications   Medication Sig Start Date End Date Taking? Authorizing Provider  Ascorbic Acid (VITAMIN C) 1000 MG tablet Take 1,000 mg by mouth daily.    [provider]  aspirin EC 81 MG tablet Take 81 mg by mouth daily.      [provider]  Calcium Carbonate (CALCIUM 600 PO) Take 600 mg by mouth 2 (two) times daily.     [provider]  carvedilol (COREG) 6.25 MG tablet TAKE 1 TABLET BY MOUTH TWO  TIMES DAILY 10/22/17   Burchette, Alinda Sierras, MD  cholecalciferol (VITAMIN D) 1000 UNITS tablet Take 1,000 Units by mouth daily.      [provider]  doxycycline (VIBRAMYCIN) 100 MG capsule Take 1 capsule (100 mg total) by mouth 2 (two) times daily. 11/05/17   Noemi Chapel, MD  fluticasone (FLONASE) 50 MCG/ACT nasal spray Place 2 sprays into both nostrils daily. 11/05/17   Noemi Chapel, MD  glipiZIDE (GLUCOTROL XL) 2.5 MG 24 hr  tablet TAKE 1 TABLET BY MOUTH  DAILY 07/30/17   Burchette, Alinda Sierras, MD  hydrochlorothiazide (HYDRODIURIL) 25 MG tablet Take 1 tablet by mouth  daily 07/30/15   Burchette, Alinda Sierras, MD  lisinopril (PRINIVIL,ZESTRIL) 20 MG tablet TAKE 1 TABLET BY MOUTH  DAILY 07/30/17   Burchette, Alinda Sierras, MD  lovastatin (MEVACOR) 40 MG tablet TAKE 1 TABLET BY MOUTH AT  BEDTIME 07/30/17   Burchette, Alinda Sierras, MD  metFORMIN (GLUCOPHAGE) 500 MG tablet TAKE 2 TABLETS BY MOUTH  EVERY MORNING AND 1 TABLET  BY MOUTH EVERY EVENING 10/04/16   Burchette, Alinda Sierras, MD  Multiple Vitamin (MULTIVITAMIN) tablet Take 1 tablet by mouth daily.      [provider]  NIFEdipine (PROCARDIA XL) 30 MG 24 hr tablet Take 1 tablet (30 mg total) by mouth 2 (two)  times daily. 03/19/17   Burchette, Alinda Sierras, MD  sertraline (ZOLOFT) 50 MG tablet TAKE 1 TABLET BY MOUTH  DAILY 07/30/17   Burchette, Alinda Sierras, MD  tobramycin (TOBREX) 0.3 % ophthalmic solution Place 2 drops into both eyes every 4 (four) hours. 01/22/17   Burchette, Alinda Sierras, MD    Family History Family History  Problem Relation Age of Onset  . Arthritis Unknown        fhx  . Hyperlipidemia Unknown        fhx  . Hypertension Unknown        fhx  . Diabetes Unknown        fhx  . Stroke Unknown        fhx  . Breast cancer Neg Hx     Social History Social History   Tobacco Use  . Smoking status: Never Smoker  . Smokeless tobacco: Never Used  Substance Use Topics  . Alcohol use: No  . Drug use: No     Allergies   Azithromycin and Heparin   Review of Systems Review of Systems  All other systems reviewed and are negative.    Physical Exam Updated Vital Signs BP (!) 148/65   Pulse 72   Temp 97.8 F (36.6 C) (Oral)   Resp 18   Ht 5\' 3"  (1.6 m)   Wt 91.6 kg (202 lb)   SpO2 99%   BMI 35.78 kg/m   Physical Exam  Constitutional: She appears well-developed and well-nourished. No distress.  HENT:  Head: Normocephalic and atraumatic.  Mouth/Throat: Oropharynx is clear and moist. No oropharyngeal exudate.  Tympanic membranes are clear bilaterally, there is no effusions or purulent collections, no erythema and no external auditory canal tenderness or occlusion.  Nasal passages with swelling turbinates, oropharynx is clear and moist  Eyes: Conjunctivae and EOM are normal. Pupils are equal, round, and reactive to light. Right eye exhibits no discharge. Left eye exhibits no discharge. No scleral icterus.  Conjunctive are clear and the pupils are equal round and reactive to light  Neck: Normal range of motion. Neck supple. No JVD present. No thyromegaly present.  Very supple neck, no torticollis, no lymphadenopathy  Cardiovascular: Normal rate, regular rhythm, normal heart  sounds and intact distal pulses. Exam reveals no gallop and no friction rub.  No murmur heard. Pulmonary/Chest: Effort normal and breath sounds normal. No respiratory distress. She has no wheezes. She has no rales.  Abdominal: Soft. Bowel sounds are normal. She exhibits no distension and no mass. There is no tenderness.  Musculoskeletal: Normal range of motion. She exhibits no edema or tenderness.  Lymphadenopathy:    She has no  cervical adenopathy.  Neurological: She is alert. Coordination normal.  Clear speech, normal gait, normal strength in all 4 extremities.  No inducible nystagmus  Skin: Skin is warm and dry. No rash noted. No erythema.  Psychiatric: She has a normal mood and affect. Her behavior is normal.  Nursing note and vitals reviewed.    ED Treatments / Results  Labs (all labs ordered are listed, but only abnormal results are displayed) Labs Reviewed  CBC WITH DIFFERENTIAL/PLATELET - Abnormal; Notable for the following components:      Result Value   RBC 3.85 (*)    Hemoglobin 11.5 (*)    HCT 34.6 (*)    All other components within normal limits  BASIC METABOLIC PANEL - Abnormal; Notable for the following components:   Glucose, Bld 112 (*)    BUN 29 (*)    Creatinine, Ser 1.63 (*)    GFR calc non Af Amer 30 (*)    GFR calc Af Amer 34 (*)    All other components within normal limits  CBG MONITORING, ED - Abnormal; Notable for the following components:   Glucose-Capillary 200 (*)    All other components within normal limits  URINALYSIS, ROUTINE W REFLEX MICROSCOPIC    EKG  EKG Interpretation None       Radiology No results found.  Procedures Procedures (including critical care time)  Medications Ordered in ED Medications - No data to display   Initial Impression / Assessment and Plan / ED Course  I have reviewed the triage vital signs and the nursing notes.  Pertinent labs & imaging results that were available during my care of the patient were  reviewed by me and considered in my medical decision making (see chart for details).    Nursing had ordered labs on the patient was she was in the waiting room, there is nothing significant other than her creatinine but as seen below this is not outside the range of where she has been.  I believe that the patient likely had some peripheral vertigo, this is likely due to the ongoing congested state, she has been using Afrin and with her history of hypertension I would not suggest the use of this or Sudafed any longer, I will switch her to [facility] and give her a trial of a atypical antibiotic such as doxycycline given her allergy to macrolides.  She can follow-up in the outpatient setting.  She appears totally stable for discharge and is in agreement with the plan.  BUN  Date Value Ref Range Status  11/05/2017 29 (H) 6 - 20 mg/dL Final  12/01/2016 27 (H) 6 - 23 mg/dL Final  10/23/2016 26 (H) 6 - 23 mg/dL Final  06/12/2016 20 6 - 23 mg/dL Final   Creatinine, Ser  Date Value Ref Range Status  11/05/2017 1.63 (H) 0.44 - 1.00 mg/dL Final  12/01/2016 1.47 (H) 0.40 - 1.20 mg/dL Final  10/23/2016 1.56 (H) 0.40 - 1.20 mg/dL Final  06/12/2016 1.36 (H) 0.40 - 1.20 mg/dL Final      Final Clinical Impressions(s) / ED Diagnoses   Final diagnoses:  Acute recurrent frontal sinusitis  Peripheral vertigo, unspecified laterality    ED Discharge Orders        Ordered    fluticasone (FLONASE) 50 MCG/ACT nasal spray  Daily     11/05/17 1628    doxycycline (VIBRAMYCIN) 100 MG capsule  2 times daily     11/05/17 1628       Noemi Chapel, MD  11/05/17 1629  

## 2017-11-13 ENCOUNTER — Other Ambulatory Visit: Payer: Self-pay

## 2017-11-13 MED ORDER — METFORMIN HCL 500 MG PO TABS: ORAL_TABLET | ORAL | 1 refills | Status: DC

## 2017-11-13 MED ORDER — HYDROCHLOROTHIAZIDE 25 MG PO TABS: 25.0000 mg | ORAL_TABLET | Freq: Every day | ORAL | 3 refills | Status: DC

## 2017-11-14 ENCOUNTER — Other Ambulatory Visit: Payer: Self-pay | Admitting: *Deleted

## 2017-11-14 MED ORDER — METFORMIN HCL 500 MG PO TABS: ORAL_TABLET | ORAL | 1 refills | Status: DC

## 2017-11-14 MED ORDER — HYDROCHLOROTHIAZIDE 25 MG PO TABS: 25.0000 mg | ORAL_TABLET | Freq: Every day | ORAL | 1 refills | Status: DC

## 2017-12-25 DIAGNOSIS — H25813 Combined forms of age-related cataract, bilateral: Secondary | ICD-10-CM | POA: Diagnosis not present

## 2017-12-25 DIAGNOSIS — H2511 Age-related nuclear cataract, right eye: Secondary | ICD-10-CM | POA: Diagnosis not present

## 2017-12-25 DIAGNOSIS — E119 Type 2 diabetes mellitus without complications: Secondary | ICD-10-CM | POA: Diagnosis not present

## 2017-12-25 DIAGNOSIS — H40013 Open angle with borderline findings, low risk, bilateral: Secondary | ICD-10-CM | POA: Diagnosis not present

## 2017-12-25 LAB — HM DIABETES EYE EXAM

## 2017-12-28 ENCOUNTER — Other Ambulatory Visit: Payer: Self-pay | Admitting: Family Medicine

## 2017-12-31 ENCOUNTER — Encounter: Payer: Self-pay | Admitting: Family Medicine

## 2018-03-04 ENCOUNTER — Telehealth: Payer: Self-pay | Admitting: Family Medicine

## 2018-03-04 NOTE — Telephone Encounter (Signed)
Could start Colace 100 mg two once daily (OTC).

## 2018-03-04 NOTE — Telephone Encounter (Signed)
Copied from [place] [phone]. Topic: Inquiry >> Mar 04, 2018 10:06 AM Conception Chancy, NT wrote: Reason for CRM: patient states she is having cataract surgery on Monday 03/11/18 and she states sometimes she gets constipated and she knows that she can not strain during eye surgery and would like to know Dr. Elease Hashimoto recommendations.

## 2018-03-04 NOTE — Telephone Encounter (Signed)
Left message on machine for patient to return our call.  CRM created 

## 2018-03-08 NOTE — Telephone Encounter (Signed)
Patient is aware 

## 2018-03-11 DIAGNOSIS — H2511 Age-related nuclear cataract, right eye: Secondary | ICD-10-CM | POA: Diagnosis not present

## 2018-03-11 DIAGNOSIS — H25811 Combined forms of age-related cataract, right eye: Secondary | ICD-10-CM | POA: Diagnosis not present

## 2018-03-21 DIAGNOSIS — H2512 Age-related nuclear cataract, left eye: Secondary | ICD-10-CM | POA: Diagnosis not present

## 2018-03-25 DIAGNOSIS — H25812 Combined forms of age-related cataract, left eye: Secondary | ICD-10-CM | POA: Diagnosis not present

## 2018-03-25 DIAGNOSIS — H2512 Age-related nuclear cataract, left eye: Secondary | ICD-10-CM | POA: Diagnosis not present

## 2018-04-09 ENCOUNTER — Other Ambulatory Visit: Payer: Self-pay | Admitting: Family Medicine

## 2018-04-22 ENCOUNTER — Ambulatory Visit: Payer: Medicare Other | Admitting: Family Medicine

## 2018-05-03 ENCOUNTER — Ambulatory Visit (INDEPENDENT_AMBULATORY_CARE_PROVIDER_SITE_OTHER): Payer: Medicare Other | Admitting: Family Medicine

## 2018-05-03 ENCOUNTER — Encounter: Payer: Self-pay | Admitting: Family Medicine

## 2018-05-03 VITALS — BP 122/80 | HR 72 | Temp 98.2°F | Wt 195.7 lb

## 2018-05-03 DIAGNOSIS — I10 Essential (primary) hypertension: Secondary | ICD-10-CM

## 2018-05-03 DIAGNOSIS — E119 Type 2 diabetes mellitus without complications: Secondary | ICD-10-CM

## 2018-05-03 DIAGNOSIS — E785 Hyperlipidemia, unspecified: Secondary | ICD-10-CM

## 2018-05-03 LAB — POCT GLYCOSYLATED HEMOGLOBIN (HGB A1C): Hemoglobin A1C: 6.3 % — AB (ref 4.0–5.6)

## 2018-05-03 NOTE — Patient Instructions (Signed)
Let's try leaving off the Glipizide for now.   Set up physical for next visit.

## 2018-05-03 NOTE — Progress Notes (Signed)
  Subjective:     Patient ID: Brenda Patterson, female   DOB: 08-Mar-1941, 77 y.o.   MRN: [id number]  HPI Patient seen for medical follow-up. She has history of type 2 diabetes, obesity, hyperlipidemia, chronic kidney disease. She has done an excellent job with weight loss by restricting overall calories. She has lost down from a high weight of 253 pounds to current weight of 195 pounds. Feels better overall. Last A1c 6.8%.  Medications reviewed. Compliant with all. Occasional hypoglycemic symptoms.  Past Medical History:  Diagnosis Date  . Allergy   . Arthritis   . 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 Diabetes mellitus   . Hyperlipidemia   . Hypertension   . Migraines    Past Surgical History:  Procedure Laterality Date  . CHOLECYSTECTOMY    . TUBAL LIGATION      reports that she has never smoked. She has never used smokeless tobacco. She reports that she does not drink alcohol or use drugs. family history includes Arthritis in her unknown relative; Diabetes in her unknown relative; Hyperlipidemia in her unknown relative; Hypertension in her unknown relative; Stroke in her unknown relative. Allergies  Allergen Reactions  . Azithromycin Nausea Only  . Heparin     Patient states it makes her weak     Review of Systems  Constitutional: Negative for fatigue.  Eyes: Negative for visual disturbance.  Respiratory: Negative for cough, chest tightness, shortness of breath and wheezing.   Cardiovascular: Negative for chest pain, palpitations and leg swelling.  Neurological: Negative for dizziness, seizures, syncope, weakness, light-headedness and headaches.       Objective:   Physical Exam  Constitutional: She appears well-developed and well-nourished.  Eyes: Pupils are equal, round, and reactive to light.  Neck: Neck supple. No JVD present. No thyromegaly present.  Cardiovascular: Normal rate and regular rhythm. Exam reveals no gallop.  Pulmonary/Chest:  Effort normal and breath sounds normal. No respiratory distress. She has no wheezes. She has no rales.  Musculoskeletal: She exhibits no edema.  Neurological: She is alert.       Assessment:     Type 2 diabetes improving control A1c today 6.3%  Hypertension improved and at goal  History of dyslipidemia treated with statin with Mevacor    Plan:     -We give her option of dropping off glipizide at this time -Continue weight loss efforts and set up physical for next follow-up in 6 months.  [name] MD [place] at [facility]

## 2018-06-05 ENCOUNTER — Encounter: Payer: Self-pay | Admitting: Family Medicine

## 2018-06-05 ENCOUNTER — Ambulatory Visit (INDEPENDENT_AMBULATORY_CARE_PROVIDER_SITE_OTHER): Payer: Medicare Other

## 2018-06-05 ENCOUNTER — Ambulatory Visit (INDEPENDENT_AMBULATORY_CARE_PROVIDER_SITE_OTHER): Payer: Medicare Other | Admitting: Family Medicine

## 2018-06-05 VITALS — BP 132/76 | HR 79 | Temp 98.4°F | Resp 16 | Ht 63.0 in | Wt 197.0 lb

## 2018-06-05 DIAGNOSIS — R053 Chronic cough: Secondary | ICD-10-CM

## 2018-06-05 DIAGNOSIS — J309 Allergic rhinitis, unspecified: Secondary | ICD-10-CM | POA: Diagnosis not present

## 2018-06-05 DIAGNOSIS — J988 Other specified respiratory disorders: Secondary | ICD-10-CM

## 2018-06-05 DIAGNOSIS — R062 Wheezing: Secondary | ICD-10-CM

## 2018-06-05 DIAGNOSIS — R05 Cough: Secondary | ICD-10-CM

## 2018-06-05 MED ORDER — ALBUTEROL SULFATE HFA 108 (90 BASE) MCG/ACT IN AERS: 2.0000 | INHALATION_SPRAY | Freq: Four times a day (QID) | RESPIRATORY_TRACT | 0 refills | Status: DC | PRN

## 2018-06-05 MED ORDER — BENZONATATE 100 MG PO CAPS: 200.0000 mg | ORAL_CAPSULE | Freq: Two times a day (BID) | ORAL | 0 refills | Status: AC | PRN

## 2018-06-05 MED ORDER — DOXYCYCLINE HYCLATE 100 MG PO TABS: 100.0000 mg | ORAL_TABLET | Freq: Two times a day (BID) | ORAL | 0 refills | Status: AC

## 2018-06-05 NOTE — Patient Instructions (Addendum)
  [name] I have seen you today for an acute visit.  A few things to remember from today's visit:   Persistent cough - Plan: DG Chest 2 View, benzonatate (TESSALON) 100 MG capsule  Respiratory tract infection - Plan: doxycycline (VIBRA-TABS) 100 MG tablet  Allergic rhinitis, unspecified seasonality, unspecified trigger  Wheezing symptom - Plan: albuterol (PROVENTIL HFA;VENTOLIN HFA) 108 (90 Base) MCG/ACT inhaler   Medications prescribed today are intended for short period of time and will not be refill upon request, a follow up appointment might be necessary to discuss continuation of of treatment if appropriate.  Albuterol inh 2 puff every 6 hours for a week then as needed for wheezing or shortness of breath.  Continue Flonase nasal spray daily. Start nasal irrigation with saline as needed. Take antibiotic with food.    In general please monitor for signs of worsening symptoms and seek immediate medical attention if any concerning.   I hope you get better soon!

## 2018-06-05 NOTE — Progress Notes (Signed)
ACUTE VISIT  HPI:  Chief Complaint  Patient presents with  . Sinusitis  . Cough    [name] is a 77 y.o.female here today complaining of 3 to 4 weeks of respiratory symptoms.  She thinks symptoms are caused by sinus infection.  According to patient, she has had productive cough and sinus congestion since she saw PCP last to follow on her chronic health problems, she did not mention it because she thought she was getting better. For the past couple days she feels like symptoms are getting worse. Cough is worse at night when lying down,interferring with sleep and early in the morning. Productive cough with yellowish sputum for the past couple days, it was clear.  Frontal pressure headache, she denies facial pain. Negative for dyspnea but she thinks she has had some wheezing. N history of COPD or asthma. History of allergy rhinitis, she resume Flonase nasal spray 2 days ago.  Cough  This is a new problem. The current episode started 1 to 4 weeks ago. The problem has been gradually worsening. The problem occurs every few hours. The cough is productive of sputum. Associated symptoms include headaches, nasal congestion, postnasal drip, rhinorrhea and wheezing. Pertinent negatives include no chest pain, chills, ear congestion, ear pain, eye redness, fever, heartburn, hemoptysis, myalgias, rash, sore throat, shortness of breath or weight loss. The symptoms are aggravated by lying down. She has tried OTC cough suppressant for the symptoms. The treatment provided no relief. Her past medical history is significant for bronchitis and environmental allergies. There is no history of asthma or COPD.   No Hx of recent travel. No sick contact, although she mentions  that her daughter has been in the hospital for a few days and she has been visiting.   OTC medications for this problem: Cough drops.   DM II, she is not checking BS's.  Lab Results  Component Value Date   HGBA1C 6.3 (A) 05/03/2018   Hypertension, currently she is on lisinopril 20 mg, Coreg, and Procardia XL.   Review of Systems  Constitutional: Positive for fatigue. Negative for activity change, appetite change, chills, fever and weight loss.  HENT: Positive for congestion, postnasal drip, rhinorrhea and sinus pressure. Negative for ear pain, facial swelling, mouth sores, nosebleeds, sore throat, trouble swallowing and voice change.   Eyes: Negative for redness and itching.  Respiratory: Positive for cough and wheezing. Negative for hemoptysis and shortness of breath.   Cardiovascular: Negative for chest pain and leg swelling.  Gastrointestinal: Negative for abdominal pain, diarrhea, heartburn, nausea and vomiting.  Musculoskeletal: Negative for gait problem and myalgias.  Skin: Negative for rash.  Allergic/Immunologic: Positive for environmental allergies.  Neurological: Positive for headaches. Negative for syncope and weakness.  Psychiatric/Behavioral: Positive for sleep disturbance. Negative for confusion.      Current Outpatient Medications on File Prior to Visit  Medication Sig Dispense Refill  . Ascorbic Acid (VITAMIN C) 1000 MG tablet Take 1,000 mg by mouth daily.    Marland Kitchen aspirin EC 81 MG tablet Take 81 mg by mouth daily.      . Calcium Carbonate (CALCIUM 600 PO) Take 600 mg by mouth 2 (two) times daily.     . carvedilol (COREG) 6.25 MG tablet TAKE 1 TABLET BY MOUTH TWO  TIMES DAILY 180 tablet 0  . cholecalciferol (VITAMIN D) 1000 UNITS tablet Take 1,000 Units by mouth daily.      . fluticasone (FLONASE) 50 MCG/ACT nasal spray Place 2  sprays into both nostrils daily. 16 g 2  . hydrochlorothiazide (HYDRODIURIL) 25 MG tablet TAKE 1 TABLET BY MOUTH  DAILY 90 tablet 0  . lisinopril (PRINIVIL,ZESTRIL) 20 MG tablet TAKE 1 TABLET BY MOUTH  DAILY 90 tablet 1  . lovastatin (MEVACOR) 40 MG tablet TAKE 1 TABLET BY MOUTH AT  BEDTIME 90 tablet 1  . metFORMIN (GLUCOPHAGE) 500 MG tablet TAKE 2  TABLETS BY MOUTH  EVERY MORNING AND 1 TABLET  BY MOUTH EVERY EVENING 270 tablet 1  . Multiple Vitamin (MULTIVITAMIN) tablet Take 1 tablet by mouth daily.      Marland Kitchen NIFEdipine (PROCARDIA-XL/ADALAT-CC/NIFEDICAL-XL) 30 MG 24 hr tablet TAKE 1 TABLET BY MOUTH TWO  TIMES DAILY 180 tablet 1  . sertraline (ZOLOFT) 50 MG tablet TAKE 1 TABLET BY MOUTH  DAILY 90 tablet 1   No current facility-administered medications on file prior to visit.      Past Medical History:  Diagnosis Date  . Allergy   . Arthritis   . 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 Diabetes mellitus   . Hyperlipidemia   . Hypertension   . Migraines    Allergies  Allergen Reactions  . Azithromycin Nausea Only  . Heparin     Patient states it makes her weak    Social History   Socioeconomic History  . Marital status: Married    Spouse name: Not on file  . Number of children: Not on file  . Years of education: Not on file  . Highest education level: Not on file  Occupational History  . Not on file  Social Needs  . Financial resource strain: Not on file  . Food insecurity:    Worry: Not on file    Inability: Not on file  . Transportation needs:    Medical: Not on file    Non-medical: Not on file  Tobacco Use  . Smoking status: Never Smoker  . Smokeless tobacco: Never Used  Substance and Sexual Activity  . Alcohol use: No  . Drug use: No  . Sexual activity: Yes  Lifestyle  . Physical activity:    Days per week: Not on file    Minutes per session: Not on file  . Stress: Not on file  Relationships  . Social connections:    Talks on phone: Not on file    Gets together: Not on file    Attends religious service: Not on file    Active member of club or organization: Not on file    Attends meetings of clubs or organizations: Not on file    Relationship status: Not on file  Other Topics Concern  . 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   Physical Exam  Nursing note and vitals reviewed. Constitutional: She is oriented to person, place, and time. She appears well-developed. She does not appear ill. No distress.  HENT:  Head: Normocephalic and atraumatic.  Right Ear: Tympanic membrane, external ear and ear canal normal.  Left Ear: Tympanic membrane, external ear and ear canal normal.  Nose: Right sinus exhibits no maxillary sinus tenderness and no frontal sinus tenderness. Left sinus exhibits no maxillary sinus tenderness and no frontal sinus tenderness.  Mouth/Throat:  Oropharynx is clear and moist and mucous membranes are normal.  Postnasal drainage.  Eyes: Conjunctivae are normal.  Cardiovascular: Normal rate and regular rhythm.  No murmur heard. Respiratory: Effort normal and breath sounds normal. No stridor. No respiratory distress.  Productive cough a few times during visit. Prolonged expiration.  Lymphadenopathy:    She has no cervical adenopathy.  Neurological: She is alert and oriented to person, place, and time. She has normal strength. Gait normal.  Skin: Skin is warm. No erythema.  Psychiatric: She has a normal mood and affect.  Well groomed, good eye contact.    ASSESSMENT AND PLAN:   Ms. Ane was seen today for sinusitis/cough.  Diagnoses and all orders for this visit:  Persistent cough  We discussed possible etiologies of cough, including allergies, residual from recent URI, GERD, medications among some. Because she has had persistent cough + history of diabetes, I think is appropriate to obtain CXR.  -     DG Chest 2 View; Future -     benzonatate (TESSALON) 100 MG capsule; Take 2 capsules (200 mg total) by mouth 2 (two) times daily as needed for up to 10 days.  Respiratory tract infection  Because persistent symptoms,prior Hx of CAP, and DM II I am recommending  empiric treatment with antibiotics. We discussed some side effects. Plain Mucinex. Adequate hydration. Further recommendations will be given according to imaging results. Recommend following with PCP in 2 weeks, before if needed.  -     doxycycline (VIBRA-TABS) 100 MG tablet; Take 1 tablet (100 mg total) by mouth 2 (two) times daily for 7 days.  Allergic rhinitis, unspecified seasonality, unspecified trigger  I do not think at this time she has a bacterial sinus infection. Recommend continuing Flonase nasal spray and started nasal irrigations with saline as needed. Follow-up with PCP in 2 weeks, before if needed.  Wheezing symptom  No wheezing appreciated upon auscultation today. At this time I do not think she needs to take oral steroids. I recommend Albuterol inh 2 puff every 6 hours for a week then as needed for wheezing or shortness of breath.  Instructed about warning signs. Follow-up with PCP in 2 weeks, before if needed.  -     albuterol (PROVENTIL HFA;VENTOLIN HFA) 108 (90 Base) MCG/ACT inhaler; Inhale 2 puffs into the lungs every 6 (six) hours as needed for wheezing or shortness of breath.     Betty G. Martinique, MD  [facility]. [place] office.

## 2018-06-19 ENCOUNTER — Other Ambulatory Visit: Payer: Self-pay | Admitting: Family Medicine

## 2018-06-21 DIAGNOSIS — R05 Cough: Secondary | ICD-10-CM | POA: Diagnosis not present

## 2018-08-20 ENCOUNTER — Other Ambulatory Visit: Payer: Self-pay | Admitting: Family Medicine

## 2018-08-20 DIAGNOSIS — Z1231 Encounter for screening mammogram for malignant neoplasm of breast: Secondary | ICD-10-CM

## 2018-09-03 ENCOUNTER — Other Ambulatory Visit: Payer: Self-pay

## 2018-09-03 NOTE — Patient Outreach (Signed)
[facility]) Care Management  09/03/2018  Brenda Patterson November 18, 1940 [id number]   Medication Adherence call to Mrs. Sharah Finnell left a message for patient to call back patient is due on all her medication Lisinopril 20 mg, Metfromin 500mg , Glipizide Er 2.5 mg and Lovastatin 40 mg.under [place].   [place] Management Direct Dial [phone]  Fax [phone] [email or web address] [email or web address]

## 2018-09-11 DIAGNOSIS — E162 Hypoglycemia, unspecified: Secondary | ICD-10-CM | POA: Diagnosis not present

## 2018-09-11 DIAGNOSIS — E161 Other hypoglycemia: Secondary | ICD-10-CM | POA: Diagnosis not present

## 2018-09-16 ENCOUNTER — Other Ambulatory Visit: Payer: Self-pay

## 2018-09-16 NOTE — Patient Outreach (Signed)
[facility]) Care Management  09/16/2018  [name] January 09, 1941 [id number]   Medication Adherence call to Brenda Patterson spoke with patient she said he has medication at this time and does not need any patient is due on Lisinopril 20 mg, Metformin 500 mg, Glipizide Er 2.5 mg and Lovastatin 40 mg. Mrs. Weyandt is showing past due under [place].   [place] Management Direct Dial [phone]  Fax [phone] [email or web address]

## 2018-09-26 ENCOUNTER — Ambulatory Visit: Payer: Medicare Other

## 2018-10-08 ENCOUNTER — Ambulatory Visit
Admission: RE | Admit: 2018-10-08 | Discharge: 2018-10-08 | Disposition: A | Discharge status: Home / Self Care | Payer: Medicare Other | Source: Ambulatory Visit | Attending: Family Medicine | Admitting: Family Medicine

## 2018-10-08 DIAGNOSIS — Z1231 Encounter for screening mammogram for malignant neoplasm of breast: Secondary | ICD-10-CM | POA: Diagnosis not present

## 2018-10-25 ENCOUNTER — Ambulatory Visit (INDEPENDENT_AMBULATORY_CARE_PROVIDER_SITE_OTHER): Payer: Medicare Other | Admitting: Family Medicine

## 2018-10-25 ENCOUNTER — Encounter: Payer: Self-pay | Admitting: Family Medicine

## 2018-10-25 VITALS — BP 130/70 | HR 86 | Temp 98.7°F | Resp 12 | Ht 63.0 in | Wt 189.1 lb

## 2018-10-25 DIAGNOSIS — J069 Acute upper respiratory infection, unspecified: Secondary | ICD-10-CM | POA: Diagnosis not present

## 2018-10-25 DIAGNOSIS — R110 Nausea: Secondary | ICD-10-CM

## 2018-10-25 DIAGNOSIS — J309 Allergic rhinitis, unspecified: Secondary | ICD-10-CM

## 2018-10-25 MED ORDER — ONDANSETRON HCL 4 MG PO TABS: 4.0000 mg | ORAL_TABLET | Freq: Three times a day (TID) | ORAL | 0 refills | Status: AC | PRN

## 2018-10-25 MED ORDER — DOXYCYCLINE HYCLATE 100 MG PO TABS: 100.0000 mg | ORAL_TABLET | Freq: Two times a day (BID) | ORAL | 0 refills | Status: AC

## 2018-10-25 NOTE — Patient Instructions (Addendum)
A few things to remember from today's visit:   URI, acute  Nausea without vomiting - Plan: ondansetron (ZOFRAN) 4 MG tablet  Allergic rhinitis, unspecified seasonality, unspecified trigger  viral infections are self-limited and we treat each symptom depending of severity.  Over the counter medications as decongestants and cold medications usually help, they need to be taken with caution if there is a history of high blood pressure or palpitations. Tylenol also helps with most symptoms (headache, muscle aching, fever,etc) Plenty of fluids. Honey helps with cough. Steam inhalations helps with runny nose, nasal congestion, and may prevent sinus infections. Cough and nasal congestion could last a few days and sometimes weeks. Please follow in not any better in 1-2 weeks or if symptoms get worse.  Please be sure medication list is accurate. If a new problem present, please set up appointment sooner than planned today.

## 2018-10-25 NOTE — Progress Notes (Signed)
ACUTE VISIT  HPI:  Chief Complaint  Patient presents with  . Cough    x 3 days  . Nasal Congestion  . Diarrhea  . Nausea    [name] is a 77 y.o.female here today complaining of 3-4 days of respiratory symptoms. Mild nasal congestion,rhinorrhea, and post nasal drainage. Productive cough with yellowish sputum. No dyspnea,wheezing,or chest pain.  + Fatigue,body aches,ear fullness sensation and earache. She has not noted fever or chills.  Also having diarrhea and nausea. No recent abx use.  "Gassy" abdominal pain,alleviated by passing gas and defecation.  Today she has had 3 stools,no blood. She took a zofran tab she had left from old Rx.  URI   This is a new problem. The current episode started in the past 7 days. The problem has been unchanged. There has been no fever. Associated symptoms include abdominal pain, congestion, coughing, diarrhea, nausea, neck pain, a plugged ear sensation and rhinorrhea. Pertinent negatives include no dysuria, ear pain, headaches, rash, sore throat, swollen glands, vomiting or wheezing. She has tried acetaminophen for the symptoms. The treatment provided mild relief.   No Hx of recent travel. No sick contact. No known insect bite.  Hx of allergies: Yes,allergic rhinitis.  OTC medications for this problem: Coricidin. Nasal drops have helped.   Review of Systems  Constitutional: Positive for activity change, appetite change and fatigue. Negative for fever.  HENT: Positive for congestion, postnasal drip, rhinorrhea and sinus pressure. Negative for ear pain, mouth sores, sore throat, trouble swallowing and voice change.   Eyes: Negative for discharge, redness and itching.  Respiratory: Positive for cough. Negative for chest tightness, shortness of breath and wheezing.   Cardiovascular: Negative for leg swelling.  Gastrointestinal: Positive for abdominal pain, diarrhea and nausea. Negative for blood in stool and vomiting.   Genitourinary: Negative for decreased urine volume, dysuria and hematuria.  Musculoskeletal: Positive for neck pain. Negative for gait problem and myalgias.  Skin: Negative for rash.  Allergic/Immunologic: Positive for environmental allergies.  Neurological: Negative for weakness and headaches.  Psychiatric/Behavioral: Negative for confusion. The patient is nervous/anxious.       Current Outpatient Medications on File Prior to Visit  Medication Sig Dispense Refill  . Ascorbic Acid (VITAMIN C) 1000 MG tablet Take 1,000 mg by mouth daily.    Marland Kitchen aspirin EC 81 MG tablet Take 81 mg by mouth daily.      . Calcium Carbonate (CALCIUM 600 PO) Take 600 mg by mouth 2 (two) times daily.     . carvedilol (COREG) 6.25 MG tablet TAKE 1 TABLET BY MOUTH TWO  TIMES DAILY 180 tablet 0  . cholecalciferol (VITAMIN D) 1000 UNITS tablet Take 1,000 Units by mouth daily.      . fluticasone (FLONASE) 50 MCG/ACT nasal spray Place 2 sprays into both nostrils daily. 16 g 2  . hydrochlorothiazide (HYDRODIURIL) 25 MG tablet TAKE 1 TABLET BY MOUTH  DAILY 90 tablet 0  . lisinopril (PRINIVIL,ZESTRIL) 20 MG tablet TAKE 1 TABLET BY MOUTH  DAILY 90 tablet 1  . lovastatin (MEVACOR) 40 MG tablet TAKE 1 TABLET BY MOUTH AT  BEDTIME 90 tablet 1  . metFORMIN (GLUCOPHAGE) 500 MG tablet TAKE 2 TABLETS BY MOUTH  EVERY MORNING AND 1 TABLET  EVERY EVENING 270 tablet 1  . Multiple Vitamin (MULTIVITAMIN) tablet Take 1 tablet by mouth daily.      Marland Kitchen NIFEdipine (PROCARDIA-XL/ADALAT-CC/NIFEDICAL-XL) 30 MG 24 hr tablet TAKE 1 TABLET BY MOUTH TWO  TIMES DAILY 180 tablet 1  . sertraline (ZOLOFT) 50 MG tablet TAKE 1 TABLET BY MOUTH  DAILY 90 tablet 1  . albuterol (PROVENTIL HFA;VENTOLIN HFA) 108 (90 Base) MCG/ACT inhaler Inhale 2 puffs into the lungs every 6 (six) hours as needed for wheezing or shortness of breath. 1 Inhaler 0   No current facility-administered medications on file prior to visit.      Past Medical History:  Diagnosis Date    . Allergy   . Arthritis   . 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 Diabetes mellitus   . Hyperlipidemia   . Hypertension   . Migraines    Allergies  Allergen Reactions  . Azithromycin Nausea Only  . Heparin     Patient states it makes her weak    Social History   Socioeconomic History  . Marital status: Married    Spouse name: Not on file  . Number of children: Not on file  . Years of education: Not on file  . Highest education level: Not on file  Occupational History  . Not on file  Social Needs  . Financial resource strain: Not on file  . Food insecurity:    Worry: Not on file    Inability: Not on file  . Transportation needs:    Medical: Not on file    Non-medical: Not on file  Tobacco Use  . Smoking status: Never Smoker  . Smokeless tobacco: Never Used  Substance and Sexual Activity  . Alcohol use: No  . Drug use: No  . Sexual activity: Yes  Lifestyle  . Physical activity:    Days per week: Not on file    Minutes per session: Not on file  . Stress: Not on file  Relationships  . Social connections:    Talks on phone: Not on file    Gets together: Not on file    Attends religious service: Not on file    Active member of club or organization: Not on file    Attends meetings of clubs or organizations: Not on file    Relationship status: Not on file  Other Topics Concern  . 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   Physical Exam  Nursing note and vitals reviewed. Constitutional: She is oriented to person, place, and time. She appears well-developed. She does not appear ill. No distress.  HENT:  Head: Atraumatic.  Right Ear: Tympanic membrane, external ear and ear canal normal.  Left Ear: Tympanic membrane, external ear and ear canal normal.  Nose: Rhinorrhea  present.  Mouth/Throat: Oropharynx is clear and moist and mucous membranes are normal.  Nasal voice. Postnasal drainage. Mild tenderness upon palpation of maxillary and TMJ.  Normal sinus transillumination.  Eyes: Conjunctivae are normal.  Neck: No muscular tenderness present. No edema and no erythema present.  Cardiovascular: Normal rate and regular rhythm.  No murmur heard. Respiratory: Effort normal and breath sounds normal. No stridor. No respiratory distress.  GI: Soft. Bowel sounds are normal. She exhibits no mass. There is no abdominal tenderness.  Lymphadenopathy:       Head (right side): No submandibular adenopathy present.       Head (left side): No submandibular  adenopathy present.    She has no cervical adenopathy.  Neurological: She is alert and oriented to person, place, and time. She has normal strength.  Skin: Skin is warm. No rash noted. No erythema.  Psychiatric:  Well groomed, good eye contact.    ASSESSMENT AND PLAN:  Ms. Karan was seen today for cough, nasal congestion, diarrhea and nausea.  Diagnoses and all orders for this visit:  URI, acute Symptoms suggests a viral syndrome (upper res and GI symptoms). Symptomatic treatment recommended,I do not think abx is needed at this time. Instructed to monitor for signs of complications, including new onset of fever among some, clearly instructed about warning signs. I also explained that cough and nasal congestion can last a few days and sometimes weeks. F/U as needed.  Nausea without vomiting + Diarrhea, most likely also viral (gastroenteritis) Oral hydration. Small and frequent sips of clear fluids.  -     ondansetron (ZOFRAN) 4 MG tablet; Take 1 tablet (4 mg total) by mouth every 8 (eight) hours as needed for up to 4 days for nausea or vomiting.  Allergic rhinitis, unspecified seasonality, unspecified trigger Respiratory symptoms could be also related or aggravated by allergies. Continue nasal saline  irrigations. Flonase nasal spray daily for a few weeks then prn.  Other orders -     doxycycline (VIBRA-TABS) 100 MG tablet; Take 1 tablet (100 mg total) by mouth 2 (two) times daily for 7 days.  I do not think abx is needed at this time. Because the coming holidays, I sent abx to her pharmacy, side effects discussed. Recommend starting abx if fever or not any better in 4-5 days.      Betty G. Martinique, MD  [facility]. [place] office.

## 2018-10-27 ENCOUNTER — Encounter: Payer: Self-pay | Admitting: Family Medicine

## 2018-11-04 ENCOUNTER — Encounter: Payer: Self-pay | Admitting: Family Medicine

## 2018-11-04 ENCOUNTER — Ambulatory Visit (INDEPENDENT_AMBULATORY_CARE_PROVIDER_SITE_OTHER): Payer: Medicare Other | Admitting: Family Medicine

## 2018-11-04 ENCOUNTER — Other Ambulatory Visit: Payer: Self-pay

## 2018-11-04 VITALS — BP 110/70 | HR 72 | Temp 98.2°F | Ht 62.25 in | Wt 188.5 lb

## 2018-11-04 DIAGNOSIS — E119 Type 2 diabetes mellitus without complications: Secondary | ICD-10-CM | POA: Diagnosis not present

## 2018-11-04 DIAGNOSIS — Z23 Encounter for immunization: Secondary | ICD-10-CM

## 2018-11-04 DIAGNOSIS — Z0000 Encounter for general adult medical examination without abnormal findings: Secondary | ICD-10-CM | POA: Diagnosis not present

## 2018-11-04 LAB — BASIC METABOLIC PANEL
BUN: 41 mg/dL — ABNORMAL HIGH (ref 6–23)
CO2: 26 mEq/L (ref 19–32)
Calcium: 9 mg/dL (ref 8.4–10.5)
Chloride: 102 mEq/L (ref 96–112)
Creatinine, Ser: 2.06 mg/dL — ABNORMAL HIGH (ref 0.40–1.20)
GFR: 24.83 mL/min — ABNORMAL LOW (ref >60.00)
Glucose, Bld: 207 mg/dL — ABNORMAL HIGH (ref 70–99)
Potassium: 4.4 mEq/L (ref 3.5–5.1)
Sodium: 138 mEq/L (ref 135–145)

## 2018-11-04 LAB — CBC WITH DIFFERENTIAL/PLATELET
Basophils Absolute: 0.1 10*3/uL (ref 0.0–0.1)
Basophils Relative: 0.6 % (ref 0.0–3.0)
Eosinophils Absolute: 0.5 10*3/uL (ref 0.0–0.7)
Eosinophils Relative: 5.9 % — ABNORMAL HIGH (ref 0.0–5.0)
HCT: 30.6 % — ABNORMAL LOW (ref 36.0–46.0)
Hemoglobin: 10.6 g/dL — ABNORMAL LOW (ref 12.0–15.0)
Lymphocytes Relative: 25.2 % (ref 12.0–46.0)
Lymphs Abs: 2.3 10*3/uL (ref 0.7–4.0)
MCHC: 34.7 g/dL (ref 30.0–36.0)
MCV: 88.9 fl (ref 78.0–100.0)
Monocytes Absolute: 0.5 10*3/uL (ref 0.1–1.0)
Monocytes Relative: 5.2 % (ref 3.0–12.0)
Neutro Abs: 5.7 10*3/uL (ref 1.4–7.7)
Neutrophils Relative %: 63.1 % (ref 43.0–77.0)
Platelets: 304 10*3/uL (ref 150.0–400.0)
RBC: 3.44 Mil/uL — ABNORMAL LOW (ref 3.87–5.11)
RDW: 12.6 % (ref 11.5–15.5)
WBC: 9.1 10*3/uL (ref 4.0–10.5)

## 2018-11-04 LAB — HEPATIC FUNCTION PANEL
ALT: 12 U/L (ref 0–35)
AST: 12 U/L (ref 0–37)
Albumin: 3.7 g/dL (ref 3.5–5.2)
Alkaline Phosphatase: 73 U/L (ref 39–117)
Bilirubin, Direct: 0 mg/dL (ref 0.0–0.3)
Total Bilirubin: 0.3 mg/dL (ref 0.2–1.2)
Total Protein: 6.2 g/dL (ref 6.0–8.3)

## 2018-11-04 LAB — LDL CHOLESTEROL, DIRECT: Direct LDL: 111 mg/dL

## 2018-11-04 LAB — LIPID PANEL
Cholesterol: 180 mg/dL (ref 0–200)
HDL: 35.3 mg/dL — ABNORMAL LOW (ref >39.00)
NonHDL: 144.57
Total CHOL/HDL Ratio: 5
Triglycerides: 255 mg/dL — ABNORMAL HIGH (ref 0.0–149.0)
VLDL: 51 mg/dL — ABNORMAL HIGH (ref 0.0–40.0)

## 2018-11-04 LAB — POCT GLYCOSYLATED HEMOGLOBIN (HGB A1C): Hemoglobin A1C: 6.9 % — AB (ref 4.0–5.6)

## 2018-11-04 LAB — TSH: TSH: 5.69 u[IU]/mL — ABNORMAL HIGH (ref 0.35–4.50)

## 2018-11-04 LAB — HEMOGLOBIN A1C: Hgb A1c MFr Bld: 7.2 % — ABNORMAL HIGH (ref 4.6–6.5)

## 2018-11-04 NOTE — Progress Notes (Signed)
Subjective:     Patient ID: Brenda Patterson, female   DOB: 07/13/1941, 77 y.o.   MRN: [id number]  HPI Patient here for physical exam.  Chronic problems include history of obesity, hypertension, type 2 diabetes, chronic kidney disease, hyperlipidemia.  She also has mild anemia of chronic disease.  She had one episode of possible hypoglycemia recently when out shopping.  She only takes metformin.  No insulin or sulfonylurea use.  She drank some chocolate milk and symptoms resolved.  By the time she checked blood sugar was around 200.  Patient had colonoscopy around 2004 but she is not sure of date.  No history of shingles vaccine.  Still needs flu vaccine.  Last tetanus over 10 years ago.  She had DEXA scan 11/18.  Has had both pneumonia vaccines.  Just had mammogram earlier this month which was normal  Past Medical History:  Diagnosis Date  . Allergy   . Arthritis   . 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 Diabetes mellitus   . Hyperlipidemia   . Hypertension   . Migraines    Past Surgical History:  Procedure Laterality Date  . CHOLECYSTECTOMY    . TUBAL LIGATION      reports that she has never smoked. She has never used smokeless tobacco. She reports that she does not drink alcohol or use drugs. family history includes Arthritis in an other family member; Breast cancer in her sister; Diabetes in an other family member; Hyperlipidemia in an other family member; Hypertension in an other family member; Stroke in an other family member. Allergies  Allergen Reactions  . Azithromycin Nausea Only  . Heparin     Patient states it makes her weak     Review of Systems  Constitutional: Negative for activity change, appetite change, fatigue, fever and unexpected weight change.  HENT: Negative for ear pain, hearing loss, sore throat and trouble swallowing.   Eyes: Negative for visual disturbance.  Respiratory: Negative for cough and shortness of breath.    Cardiovascular: Negative for chest pain and palpitations.  Gastrointestinal: Negative for abdominal pain, blood in stool, constipation and diarrhea.  Genitourinary: Negative for dysuria and hematuria.  Musculoskeletal: Negative for arthralgias, back pain and myalgias.  Skin: Negative for rash.  Neurological: Negative for dizziness, syncope and headaches.  Hematological: Negative for adenopathy.  Psychiatric/Behavioral: Negative for confusion and dysphoric mood.       Objective:   Physical Exam Constitutional:      Appearance: She is well-developed.  HENT:     Head: Normocephalic and atraumatic.  Eyes:     Pupils: Pupils are equal, round, and reactive to light.  Neck:     Musculoskeletal: Normal range of motion and neck supple.     Thyroid: No thyromegaly.  Cardiovascular:     Rate and Rhythm: Normal rate and regular rhythm.     Heart sounds: Normal heart sounds. No murmur.  Pulmonary:     Effort: No respiratory distress.     Breath sounds: Normal breath sounds. No wheezing or rales.  Abdominal:     General: Bowel sounds are normal. There is no distension.     Palpations: Abdomen is soft. There is no mass.     Tenderness: There is no abdominal tenderness. There is no guarding or rebound.  Musculoskeletal: Normal range of motion.  Lymphadenopathy:     Cervical: No cervical adenopathy.  Skin:    Comments: Feet reveal no skin lesions. Good  distal foot pulses. Good capillary refill. No calluses. Normal sensation with monofilament testing  She has vitiligo involving mostly extremities  Neurological:     Mental Status: She is alert and oriented to person, place, and time.     Cranial Nerves: No cranial nerve deficit.     Deep Tendon Reflexes: Reflexes normal.  Psychiatric:        Behavior: Behavior normal.        Thought Content: Thought content normal.        Judgment: Judgment normal.        Assessment:     Physical exam.  She has type 2 diabetes stable with A1c  6.9%.  Several health maintenance issues addressed as below    Plan:     -Check on coverage for shingles vaccine and she will check at pharmacy -Confirm date of last colonoscopy.  If over 10 years consider repeat though she is not sure she wishes to pursue at this time -Continue with yearly mammogram -Flu vaccine given -Pneumonia vaccines up-to-date -Obtain screening labs including lipids -Routine follow-up in 6 months and sooner as needed  [name] MD [facility] at [facility]

## 2018-11-04 NOTE — Addendum Note (Signed)
Addended by: Anibal Henderson on: 11/04/2018 09:57 AM   Modules accepted: Orders

## 2018-11-04 NOTE — Patient Instructions (Addendum)
Consider Shingrix vaccine- and check on insurance coverage if interested.    Check to see date of last colonoscopy.

## 2018-11-04 NOTE — Addendum Note (Signed)
Addended by: Anibal Henderson on: 11/04/2018 09:35 AM   Modules accepted: Orders

## 2018-11-22 ENCOUNTER — Ambulatory Visit (INDEPENDENT_AMBULATORY_CARE_PROVIDER_SITE_OTHER): Payer: Medicare Other | Admitting: Family Medicine

## 2018-11-22 ENCOUNTER — Other Ambulatory Visit: Payer: Self-pay

## 2018-11-22 ENCOUNTER — Encounter: Payer: Self-pay | Admitting: Family Medicine

## 2018-11-22 VITALS — BP 132/84 | HR 73 | Temp 97.2°F | Ht 62.25 in | Wt 186.5 lb

## 2018-11-22 DIAGNOSIS — N183 Chronic kidney disease, stage 3 unspecified: Secondary | ICD-10-CM

## 2018-11-22 DIAGNOSIS — R7989 Other specified abnormal findings of blood chemistry: Secondary | ICD-10-CM | POA: Diagnosis not present

## 2018-11-22 DIAGNOSIS — I10 Essential (primary) hypertension: Secondary | ICD-10-CM

## 2018-11-22 DIAGNOSIS — E1122 Type 2 diabetes mellitus with diabetic chronic kidney disease: Secondary | ICD-10-CM

## 2018-11-22 DIAGNOSIS — N179 Acute kidney failure, unspecified: Secondary | ICD-10-CM | POA: Diagnosis not present

## 2018-11-22 LAB — T4, FREE: Free T4: 0.63 ng/dL (ref 0.60–1.60)

## 2018-11-22 LAB — BASIC METABOLIC PANEL
BUN: 31 mg/dL — ABNORMAL HIGH (ref 6–23)
CO2: 26 mEq/L (ref 19–32)
Calcium: 8.9 mg/dL (ref 8.4–10.5)
Chloride: 101 mEq/L (ref 96–112)
Creatinine, Ser: 1.91 mg/dL — ABNORMAL HIGH (ref 0.40–1.20)
GFR: 25.49 mL/min — ABNORMAL LOW (ref >60.00)
Glucose, Bld: 202 mg/dL — ABNORMAL HIGH (ref 70–99)
Potassium: 4.3 mEq/L (ref 3.5–5.1)
Sodium: 136 mEq/L (ref 135–145)

## 2018-11-22 LAB — TSH: TSH: 4.87 u[IU]/mL — ABNORMAL HIGH (ref 0.35–4.50)

## 2018-11-22 NOTE — Progress Notes (Signed)
  Subjective:     Patient ID: Brenda Patterson, female   DOB: 12-03-1940, 78 y.o.   MRN: [id number]  HPI Patient here regarding some recent lab abnormalities.  She has chronic kidney disease with baseline creatinine around 1.5.  Her creatinine did increase to 2.06.  She thinks she might have been somewhat dehydrated.  She had elevated TSH of 5.69.  She has strong family history of hypothyroidism in mother and 2 sisters.  Her A1c was 6.9%.  We had her decrease lisinopril to 10 mg daily from dosage of 20 mg and hold metformin.  She generally feels well overall.  No recent peripheral edema issues.  No nonsteroidal use.  Compliant with other medications.  Past Medical History:  Diagnosis Date  . Allergy   . Arthritis   . 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 Diabetes mellitus   . Hyperlipidemia   . Hypertension   . Migraines    Past Surgical History:  Procedure Laterality Date  . CHOLECYSTECTOMY    . TUBAL LIGATION      reports that she has never smoked. She has never used smokeless tobacco. She reports that she does not drink alcohol or use drugs. family history includes Arthritis in an other family member; Breast cancer in her sister; Diabetes in an other family member; Hyperlipidemia in an other family member; Hypertension in an other family member; Stroke in an other family member. Allergies  Allergen Reactions  . Azithromycin Nausea Only  . Heparin     Patient states it makes her weak     Review of Systems  Constitutional: Negative for fatigue.  Eyes: Negative for visual disturbance.  Respiratory: Negative for cough, chest tightness, shortness of breath and wheezing.   Cardiovascular: Negative for chest pain, palpitations and leg swelling.  Endocrine: Negative for polydipsia and polyuria.  Neurological: Negative for dizziness, seizures, syncope, weakness, light-headedness and headaches.       Objective:   Physical Exam Constitutional:      Appearance:  She is well-developed.  Eyes:     Pupils: Pupils are equal, round, and reactive to light.  Neck:     Musculoskeletal: Neck supple.     Thyroid: No thyromegaly.     Vascular: No JVD.  Cardiovascular:     Rate and Rhythm: Normal rate and regular rhythm.     Heart sounds: No gallop.   Pulmonary:     Effort: Pulmonary effort is normal. No respiratory distress.     Breath sounds: Normal breath sounds. No wheezing or rales.  Neurological:     Mental Status: She is alert.        Assessment:     #1 acute worsening of chronic kidney disease possibly related to dehydration/prerenal  #2 hypertension stable  #3 elevated TSH with strong family history of hypothyroidism  #4 type 2 diabetes with recent good control with A1c 6.9%    Plan:     -Recheck basic metabolic panel and TSH along with free T4 -If creatinine improving with better hydration will continue lower dose lisinopril and hopefully can start back her metformin depending on results -Continue to avoid non-steroidals  [name] MD [facility]

## 2018-11-25 ENCOUNTER — Other Ambulatory Visit: Payer: Self-pay

## 2018-11-25 MED ORDER — LEVOTHYROXINE SODIUM 25 MCG PO CAPS: 25.0000 ug | ORAL_CAPSULE | Freq: Every day | ORAL | 3 refills | Status: DC

## 2018-11-27 ENCOUNTER — Telehealth: Payer: Self-pay | Admitting: Family Medicine

## 2018-11-27 NOTE — Telephone Encounter (Signed)
Please advise if OK to change the CAPS to tablets?

## 2018-11-27 NOTE — Telephone Encounter (Signed)
Copied from [place]. Topic: Quick Communication - See Telephone Encounter >> Nov 27, 2018 10:38 AM Bea Graff, NT wrote: CRM for notification. See Telephone encounter for: 11/27/18. John with [facility] in [place] wanted to see if the dr wants to change the Levothyroxine Sodium 25 MCG CAPS to tablets to be more cost effective for the pt?

## 2018-11-27 NOTE — Telephone Encounter (Signed)
OK to change?

## 2018-11-27 NOTE — Telephone Encounter (Signed)
Called Brenda Patterson at [facility] and gave him the verbal OK per Dr. Elease Hashimoto to change to tablets instead of capsules.  Brenda Patterson verbalized an understanding.

## 2018-12-11 ENCOUNTER — Other Ambulatory Visit: Payer: Self-pay | Admitting: Family Medicine

## 2019-02-21 ENCOUNTER — Other Ambulatory Visit: Payer: Self-pay

## 2019-02-21 ENCOUNTER — Ambulatory Visit (INDEPENDENT_AMBULATORY_CARE_PROVIDER_SITE_OTHER): Payer: Medicare Other | Admitting: Family Medicine

## 2019-02-21 DIAGNOSIS — E785 Hyperlipidemia, unspecified: Secondary | ICD-10-CM | POA: Diagnosis not present

## 2019-02-21 DIAGNOSIS — E1122 Type 2 diabetes mellitus with diabetic chronic kidney disease: Secondary | ICD-10-CM | POA: Diagnosis not present

## 2019-02-21 DIAGNOSIS — R7989 Other specified abnormal findings of blood chemistry: Secondary | ICD-10-CM

## 2019-02-21 DIAGNOSIS — N183 Chronic kidney disease, stage 3 unspecified: Secondary | ICD-10-CM

## 2019-02-21 DIAGNOSIS — I10 Essential (primary) hypertension: Secondary | ICD-10-CM

## 2019-02-21 NOTE — Progress Notes (Signed)
Patient ID: Brenda Patterson, female   DOB: 03-Dec-1940, 78 y.o.   MRN: [id number]  Virtual Visit via Video Note  I connected with Brenda Patterson on 02/21/19 at  9:45 AM EDT by a video enabled telemedicine application and verified that I am speaking with the correct person using two identifiers.  Location patient: home Location provider:work or home office Persons participating in the virtual visit: patient, provider Attempted video follow-up with Brenda Patterson but she had some technical difficulties.  I discussed the limitations of evaluation and management by telemedicine and the availability of in person appointments. The patient expressed understanding and agreed to proceed.   HPI: Patient has type 2 diabetes, chronic kidney disease, hyperlipidemia, hypertension.  She had recent elevation of creatinine and we reduced her lisinopril to 10 mg daily.  Her blood pressures been relatively stable.  She had most recent creatinine 1.91 with a GFR of 25.  Her last A1c was 6.9%.  We discontinued metformin.  She is checking blood sugar some but not regularly.  No recent hypoglycemic symptoms.  Appetite and weight are stable.  Recent elevated TSH.  We started low-dose levothyroxine 25 mcg daily which she is taking consistently.  Denies any significant fatigue issues.  Her lipids were fairly well controlled by recent readings.   ROS: See pertinent positives and negatives per HPI.  Past Medical History:  Diagnosis Date  . Allergy   . Arthritis   . 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 Diabetes mellitus   . Hyperlipidemia   . Hypertension   . Migraines     Past Surgical History:  Procedure Laterality Date  . CHOLECYSTECTOMY    . TUBAL LIGATION      Family History  Problem Relation Age of Onset  . Arthritis Other        fhx  . Hyperlipidemia Other        fhx  . Hypertension Other        fhx  . Diabetes Other        fhx  . Stroke Other        fhx  . Breast cancer  Sister     SOCIAL HX: Non-smoker.  No alcohol.   Current Outpatient Medications:  .  albuterol (PROVENTIL HFA;VENTOLIN HFA) 108 (90 Base) MCG/ACT inhaler, Inhale 2 puffs into the lungs every 6 (six) hours as needed for wheezing or shortness of breath., Disp: 1 Inhaler, Rfl: 0 .  Ascorbic Acid (VITAMIN C) 1000 MG tablet, Take 1,000 mg by mouth daily., Disp: , Rfl:  .  aspirin EC 81 MG tablet, Take 81 mg by mouth daily.  , Disp: , Rfl:  .  Calcium Carbonate (CALCIUM 600 PO), Take 600 mg by mouth 2 (two) times daily. , Disp: , Rfl:  .  carvedilol (COREG) 6.25 MG tablet, TAKE 1 TABLET BY MOUTH TWO  TIMES DAILY, Disp: 180 tablet, Rfl: 1 .  cholecalciferol (VITAMIN D) 1000 UNITS tablet, Take 1,000 Units by mouth daily.  , Disp: , Rfl:  .  fluticasone (FLONASE) 50 MCG/ACT nasal spray, Place 2 sprays into both nostrils daily., Disp: 16 g, Rfl: 2 .  hydrochlorothiazide (HYDRODIURIL) 25 MG tablet, TAKE 1 TABLET BY MOUTH  DAILY, Disp: 90 tablet, Rfl: 1 .  Levothyroxine Sodium 25 MCG CAPS, Take 1 capsule (25 mcg total) by mouth daily before breakfast., Disp: 30 capsule, Rfl: 3 .  lisinopril (PRINIVIL,ZESTRIL) 20 MG tablet, TAKE 1 TABLET BY MOUTH  DAILY (Patient taking differently: Take 10 mg by mouth daily. ), Disp: 90 tablet, Rfl: 1 .  lovastatin (MEVACOR) 40 MG tablet, TAKE 1 TABLET BY MOUTH AT  BEDTIME, Disp: 90 tablet, Rfl: 1 .  metFORMIN (GLUCOPHAGE) 500 MG tablet, TAKE 2 TABLETS BY MOUTH  EVERY MORNING AND 1 TABLET  EVERY EVENING (Patient not taking: Reported on 11/22/2018), Disp: 270 tablet, Rfl: 1 .  Multiple Vitamin (MULTIVITAMIN) tablet, Take 1 tablet by mouth daily.  , Disp: , Rfl:  .  NIFEdipine (PROCARDIA-XL/NIFEDICAL-XL) 30 MG 24 hr tablet, TAKE 1 TABLET BY MOUTH TWO  TIMES DAILY, Disp: 180 tablet, Rfl: 1 .  sertraline (ZOLOFT) 50 MG tablet, TAKE 1 TABLET BY MOUTH  DAILY, Disp: 90 tablet, Rfl: 1  EXAM:  VITALS per patient if applicable:  GENERAL: alert, oriented, appears well and in no  acute distress  HEENT: atraumatic, conjunttiva clear, no obvious abnormalities on inspection of external nose and ears  NECK: normal movements of the head and neck  LUNGS: on inspection no signs of respiratory distress, breathing rate appears normal, no obvious gross SOB, gasping or wheezing  CV: no obvious cyanosis  MS: moves all visible extremities without noticeable abnormality  PSYCH/NEURO: pleasant and cooperative, no obvious depression or anxiety, speech and thought processing grossly intact  ASSESSMENT AND PLAN:  Discussed the following assessment and plan:  #1 type 2 diabetes.  History of fairly good control.  Recent discontinuation of metformin -Continue to hold metformin for now -We plan to recheck A1c at follow-up in 2 months  #2 hypothyroidism -Needs repeat TSH.  We have elected to defer for 2 more months  #3 chronic kidney disease. -We will recheck basic metabolic panel at follow-up in 2 months.  Avoid nonsteroidals.  Continue to hold metformin  #4 hyperlipidemia-well-controlled by recent labs     I discussed the assessment and treatment plan with the patient. The patient was provided an opportunity to ask questions and all were answered. The patient agreed with the plan and demonstrated an understanding of the instructions.   The patient was advised to call back or seek an in-person evaluation if the symptoms worsen or if the condition fails to improve as anticipated.   Carolann Littler, MD

## 2019-02-27 ENCOUNTER — Telehealth: Payer: Self-pay | Admitting: Family Medicine

## 2019-02-27 NOTE — Telephone Encounter (Signed)
Patient would like a call back in regards to the medication changes made in her 02/21/2019 visit. She is mainly confused on Glipizide.

## 2019-02-28 ENCOUNTER — Other Ambulatory Visit: Payer: Self-pay

## 2019-02-28 MED ORDER — GLIPIZIDE ER 2.5 MG PO TB24: 2.5000 mg | ORAL_TABLET | Freq: Every day | ORAL | 1 refills | Status: DC

## 2019-02-28 NOTE — Telephone Encounter (Signed)
Start back Glipizide 2.5 mg po qd.  Continue to hold the Metformin.

## 2019-02-28 NOTE — Telephone Encounter (Signed)
Called patient and she stated that she stopped taking the Glipizide 2.5 mg when she stopped taking the Metformin and she was not sure if she was supposed to have stopped this also? It is currently not on her medication list.  Please advise if she is supposed to be taking Glipizide 2.5 mg and dosage.

## 2019-02-28 NOTE — Telephone Encounter (Signed)
Called patient and gave her the message and she asked to have a refill of glipizide 2.5 mg sent to [facility] for her. I have sent in her refill and patient verbalized an understanding.

## 2019-03-11 ENCOUNTER — Telehealth: Payer: Self-pay

## 2019-03-11 NOTE — Telephone Encounter (Signed)
Called patient and left a detailed voice message to let patient know to call back to schedule a 3 month follow up appointment.  OK for PEC to discuss/advise/schedule patient for 3 month diabetic follow up.  CRM Created.

## 2019-04-17 ENCOUNTER — Other Ambulatory Visit: Payer: Self-pay | Admitting: Family Medicine

## 2019-04-18 NOTE — Telephone Encounter (Signed)
Pt stated she is going out of town in the morning and only has one tablet left. Requesting a refill today. Advised that they can take up to 3 business days and refill today was not guaranteed. Please advise.  [facility] [address] [place] [address] [phone] (Phone) [phone] (Fax)

## 2019-04-24 ENCOUNTER — Other Ambulatory Visit: Payer: Self-pay | Admitting: Family Medicine

## 2019-04-29 NOTE — Telephone Encounter (Signed)
Called patient and left a detailed voice message letting the patient know that she is due for an in office follow up appointment and to please call back to schedule so we can send in her refills.  OK for PEC to discuss and/or advise.  CRM Created.

## 2019-05-26 ENCOUNTER — Ambulatory Visit (INDEPENDENT_AMBULATORY_CARE_PROVIDER_SITE_OTHER): Payer: Medicare Other | Admitting: Family Medicine

## 2019-05-26 ENCOUNTER — Other Ambulatory Visit: Payer: Self-pay

## 2019-05-26 DIAGNOSIS — I10 Essential (primary) hypertension: Secondary | ICD-10-CM | POA: Diagnosis not present

## 2019-05-26 DIAGNOSIS — E039 Hypothyroidism, unspecified: Secondary | ICD-10-CM

## 2019-05-26 DIAGNOSIS — E1122 Type 2 diabetes mellitus with diabetic chronic kidney disease: Secondary | ICD-10-CM | POA: Diagnosis not present

## 2019-05-26 DIAGNOSIS — N183 Chronic kidney disease, stage 3 unspecified: Secondary | ICD-10-CM

## 2019-05-26 DIAGNOSIS — R7989 Other specified abnormal findings of blood chemistry: Secondary | ICD-10-CM | POA: Diagnosis not present

## 2019-05-26 NOTE — Patient Outreach (Signed)
[facility]) Care Management  05/26/2019  MAYTTE JACOT [birth year] [id number]   Medication Adherence call to [name] Hippa Identifiers Verify spoke with patient she is due on Lisinopril 20 mg and Lovastatin 40 mg patient explain she is taking Lisinopril but she is only taking 1/2 tablet daily on Lovastatin she is taking it daily patient explain she has an appointment today and will ask doctor to send in a prescription to the pharmacy. Mrs. Chuang is showing past due under [place].  [facility] Management Direct Dial [phone]  Fax [phone] [email or web address]

## 2019-05-26 NOTE — Progress Notes (Signed)
Patient ID: Brenda Patterson, female   DOB: 1941-03-05, 78 y.o.   MRN: [id number]  This visit type was conducted due to national recommendations for restrictions regarding the COVID-19 pandemic in an effort to limit this patient's exposure and mitigate transmission in our community.   Virtual Visit via Telephone Note  I connected with Brenda Patterson on 05/26/19 at  3:00 PM EDT by telephone and verified that I am speaking with the correct person using two identifiers.   I discussed the limitations, risks, security and privacy concerns of performing an evaluation and management service by telephone and the availability of in person appointments. I also discussed with the patient that there may be a patient responsible charge related to this service. The patient expressed understanding and agreed to proceed.  Location patient: home Location provider: work or home office Participants present for the call: patient, provider Patient did not have a visit in the prior 7 days to address this/these issue(s).   History of Present Illness: Patient has history of type 2 diabetes, chronic kidney disease, hyperlipidemia, elevated TSH, hypertension.  She had recent bump in renal function we took her off metformin.  Last A1c 6.9%.  She had elevated TSH and we started levothyroxine 25 mcg daily.  Feels about the same symptomatically.  She was due for labs in the spring but we decided to hold because of the pandemic.  She would like to proceed with labs at this time.  Medications reviewed.  Compliant with all.  Her blood pressures been very stable.  Past Medical History:  Diagnosis Date  . Allergy   . Arthritis   . 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 Diabetes mellitus   . Hyperlipidemia   . Hypertension   . Migraines    Past Surgical History:  Procedure Laterality Date  . CHOLECYSTECTOMY    . TUBAL LIGATION      reports that she has never smoked. She has never used smokeless  tobacco. She reports that she does not drink alcohol or use drugs. family history includes Arthritis in an other family member; Breast cancer in her sister; Diabetes in an other family member; Hyperlipidemia in an other family member; Hypertension in an other family member; Stroke in an other family member. Allergies  Allergen Reactions  . Azithromycin Nausea Only  . Heparin     Patient states it makes her weak      Observations/Objective: Patient sounds cheerful and well on the phone. I do not appreciate any SOB. Speech and thought processing are grossly intact. Patient reported vitals:  Assessment and Plan: #1 hypertension stable by home readings  #2 type 2 diabetes.  She has chronic kidney disease and we had to stop her metformin.  She remains on glipizide  #3 elevated TSH.  We elected to go ahead and start low-dose levothyroxine because of some fatigue  #4 chronic kidney disease  Follow Up Instructions:  -Patient will come Wednesday morning for labs including basic metabolic panel, D5H, TSH   99441 5-10 99442 11-20 99443 21-30 I did not refer this patient for an OV in the next 24 hours for this/these issue(s).  I discussed the assessment and treatment plan with the patient. The patient was provided an opportunity to ask questions and all were answered. The patient agreed with the plan and demonstrated an understanding of the instructions.   The patient was advised to call back or seek an in-person evaluation if the symptoms  worsen or if the condition fails to improve as anticipated.  I provided 25 minutes of non-face-to-face time during this encounter.   Carolann Littler, MD

## 2019-05-27 ENCOUNTER — Other Ambulatory Visit (INDEPENDENT_AMBULATORY_CARE_PROVIDER_SITE_OTHER): Payer: Medicare Other

## 2019-05-27 DIAGNOSIS — E1122 Type 2 diabetes mellitus with diabetic chronic kidney disease: Secondary | ICD-10-CM | POA: Diagnosis not present

## 2019-05-27 DIAGNOSIS — N183 Chronic kidney disease, stage 3 (moderate): Secondary | ICD-10-CM | POA: Diagnosis not present

## 2019-05-27 LAB — TSH: TSH: 4.25 u[IU]/mL (ref 0.35–4.50)

## 2019-05-27 LAB — BASIC METABOLIC PANEL
BUN: 38 mg/dL — ABNORMAL HIGH (ref 6–23)
CO2: 24 mEq/L (ref 19–32)
Calcium: 8.6 mg/dL (ref 8.4–10.5)
Chloride: 103 mEq/L (ref 96–112)
Creatinine, Ser: 2.3 mg/dL — ABNORMAL HIGH (ref 0.40–1.20)
GFR: 20.55 mL/min — ABNORMAL LOW (ref >60.00)
Glucose, Bld: 170 mg/dL — ABNORMAL HIGH (ref 70–99)
Potassium: 4.6 mEq/L (ref 3.5–5.1)
Sodium: 136 mEq/L (ref 135–145)

## 2019-05-27 LAB — HEMOGLOBIN A1C: Hgb A1c MFr Bld: 6.9 % — ABNORMAL HIGH (ref 4.6–6.5)

## 2019-06-02 ENCOUNTER — Other Ambulatory Visit: Payer: Self-pay

## 2019-06-02 DIAGNOSIS — R7989 Other specified abnormal findings of blood chemistry: Secondary | ICD-10-CM

## 2019-06-03 ENCOUNTER — Other Ambulatory Visit: Payer: Self-pay | Admitting: Family Medicine

## 2019-06-16 ENCOUNTER — Other Ambulatory Visit: Payer: Self-pay

## 2019-06-16 ENCOUNTER — Other Ambulatory Visit (INDEPENDENT_AMBULATORY_CARE_PROVIDER_SITE_OTHER): Payer: Medicare Other

## 2019-06-16 DIAGNOSIS — R7989 Other specified abnormal findings of blood chemistry: Secondary | ICD-10-CM | POA: Diagnosis not present

## 2019-06-16 LAB — BASIC METABOLIC PANEL
BUN: 39 mg/dL — ABNORMAL HIGH (ref 6–23)
CO2: 25 mEq/L (ref 19–32)
Calcium: 9 mg/dL (ref 8.4–10.5)
Chloride: 99 mEq/L (ref 96–112)
Creatinine, Ser: 2.22 mg/dL — ABNORMAL HIGH (ref 0.40–1.20)
GFR: 21.4 mL/min — ABNORMAL LOW (ref >60.00)
Glucose, Bld: 170 mg/dL — ABNORMAL HIGH (ref 70–99)
Potassium: 4 mEq/L (ref 3.5–5.1)
Sodium: 135 mEq/L (ref 135–145)

## 2019-06-16 NOTE — Patient Outreach (Signed)
[facility]) Care Management  06/16/2019  Brenda Patterson [birth year] [id number]   Medication Adherence call to Brenda Patterson Hippa Identifiers Verify spoke with patient she is past due on Lisinopril 20 mg,Lovastatin 40 mg,Glipizide Er 2.5 mg patient explain she is taking all three medication but on Lisinopril she is only taking 1/2 tablet patient has enough at this time.patient wants a call back at a later time,patient said her daughter is on a  Life support at this time. Brenda Patterson is showing past due under [place].   [facility] Management Direct Dial [phone]  Fax [phone] [email or web address]

## 2019-06-17 ENCOUNTER — Other Ambulatory Visit: Payer: Medicare Other

## 2019-08-04 ENCOUNTER — Other Ambulatory Visit: Payer: Self-pay | Admitting: Family Medicine

## 2019-08-22 ENCOUNTER — Telehealth (INDEPENDENT_AMBULATORY_CARE_PROVIDER_SITE_OTHER): Payer: Medicare Other | Admitting: Adult Health

## 2019-08-22 ENCOUNTER — Other Ambulatory Visit: Payer: Self-pay

## 2019-08-22 DIAGNOSIS — J0141 Acute recurrent pansinusitis: Secondary | ICD-10-CM

## 2019-08-22 MED ORDER — DOXYCYCLINE HYCLATE 100 MG PO CAPS: 100.0000 mg | ORAL_CAPSULE | Freq: Two times a day (BID) | ORAL | 0 refills | Status: DC

## 2019-08-22 NOTE — Progress Notes (Signed)
Virtual Visit via Telephone Note  I connected with Brenda Patterson on 08/22/19 at  3:30 PM EDT by telephone and verified that I am speaking with the correct person using two identifiers.   I discussed the limitations, risks, security and privacy concerns of performing an evaluation and management service by telephone and the availability of in person appointments. I also discussed with the patient that there may be a patient responsible charge related to this service. The patient expressed understanding and agreed to proceed.  Location patient: home Location provider: work or home office Participants present for the call: patient, provider Patient did not have a visit in the prior 7 days to address this/these issue(s).   History of Present Illness: 78 year old female who  has a past medical history of Allergy, Arthritis, Carcinoid tumor of gallbladder, Diabetes mellitus, Hyperlipidemia, Hypertension, and Migraines.  She is being evaluated today for an acute issus she believes she has a sinus infection.  Her symptoms include sinus congestion with yellow drainage, sinus pain and pressure throughout, semiproductive cough, postnasal drip, and fatigue.  She denies fevers, chills, anorexia.  Symptoms have been present greater than 2 weeks and her symptoms are not getting any better.  She has no known history of COPD or asthma.   Observations/Objective: Patient sounds cheerful and well on the phone. I do not appreciate any SOB. Speech and thought processing are grossly intact. Patient reported vitals:  Assessment and Plan:   Follow Up Instructions:  1. Acute recurrent pansinusitis Due to symptoms and duration that the symptoms have been present we will treat with doxycycline twice daily x14 days.  She was advised to follow-up with PCP if symptoms do not start to resolve within the next 3 to 4 days or sooner if symptoms worsen.  She was advised on adequate hydration and can also use Mucinex  as needed - doxycycline (VIBRAMYCIN) 100 MG capsule; Take 1 capsule (100 mg total) by mouth 2 (two) times daily.  Dispense: 14 capsule; Refill: 0  I did not refer this patient for an OV in the next 24 hours for this/these issue(s).  I discussed the assessment and treatment plan with the patient. The patient was provided an opportunity to ask questions and all were answered. The patient agreed with the plan and demonstrated an understanding of the instructions.   The patient was advised to call back or seek an in-person evaluation if the symptoms worsen or if the condition fails to improve as anticipated.  I provided 15 minutes of non-face-to-face time during this encounter.   Brenda Peng, NP

## 2019-09-15 ENCOUNTER — Other Ambulatory Visit: Payer: Self-pay

## 2019-09-15 ENCOUNTER — Encounter: Payer: Self-pay | Admitting: Internal Medicine

## 2019-09-15 ENCOUNTER — Telehealth (INDEPENDENT_AMBULATORY_CARE_PROVIDER_SITE_OTHER): Payer: Medicare Other | Admitting: Internal Medicine

## 2019-09-15 DIAGNOSIS — J0141 Acute recurrent pansinusitis: Secondary | ICD-10-CM

## 2019-09-15 MED ORDER — CEFUROXIME AXETIL 500 MG PO TABS: 500.0000 mg | ORAL_TABLET | Freq: Two times a day (BID) | ORAL | 0 refills | Status: DC

## 2019-09-15 NOTE — Progress Notes (Signed)
   Virtual Visit via Telephone Note  I connected with@ on 09/15/19 at 10:30 AM EST by telephone and verified that I am speaking with the correct person using two identifiers.   I discussed the limitations, risks, security and privacy concerns of performing an evaluation and management service by telephone and the availability of in person appointments. I also discussed with the patient that there may be a patient responsible charge related to this service. The patient expressed understanding and agreed to proceed.  Location patient: home Location provider: work  office Participants present for the call: patient, provider Patient did not have a visit in the prior 7 days to address this/these issue(s).   History of Present Illness: DEMITA TOBIA presents for SDA PCP appt NA.    Was rx with doxy for 7  days for pansisutisi in October  16  After prolonged sinus sx    By CN phne visit   States she got about 75 % better but then got worse agon 4-5 days ago and left eye with  Pus coming out of the corners yellow without vision change and not like conjunctivitis.  Denies any periorbital puffiness has sinus pressure but no severe pain but symptoms over the bridge of her nose.  She does have a history of recurrent sinus problems seem to be seasonal has been allergy tested in the past is on Flonase.  She states her cough is from drainage no fever chest pain shortness of breath and denies tobacco product.    Observations/Objective: Patient sounds cheerful and well on the phone.  She sounds congested with occasional cough but no dyspnea I do not appreciate any SOB. Speech and thought processing are grossly intact. Patient reported vitals: No fever  Assessment and Plan: Recurrent partially treated possibly sinusitis by history and possible lacrimal gland inflammation based on history.  She states she had blisters in her mouth once with penicillin otherwise not sure if she is allergic.  As I would  prefer to use Augmentin but will use Ceftin.  Would avoid using quinolones unless she is seen in person.  She states she is to work for [employer] ENT M advised next step would be to see in person visit or see ENT.  She will get back with [place]. Follow Up Instructions: Saline Flonase compresses antibiotic Ceftin and lieu of Augmentin plan follow-up if persistent progressive as above.    99441 5-10 99442 11-20 9443 21-30 I did not refer this patient for an OV in the next 24 hours for this/these issue(s).  I discussed the assessment and treatment plan with the patient. The patient was provided an opportunity to ask questions and all were answered. The patient agreed with the plan and demonstrated an understanding of the instructions.   The patient was advised to call back or seek an in-person evaluation if the symptoms worsen or if the condition fails to improve as anticipated.  I provided 13 minutes of non-face-to-face time during this encounter.   Shanon Ace, MD

## 2019-09-16 ENCOUNTER — Other Ambulatory Visit: Payer: Self-pay

## 2019-09-16 NOTE — Patient Outreach (Signed)
[facility]) Care Management  09/16/2019  ELISEA KHADER 02/18/1941 [id number]   Medication Adherence call to [name] HIPPA Compliant Voice message left with a call back number. Mrs.Tackitt is showing past due on Lovastatin 40 mg,Glipizide Er 2.5 mg ,and Lisinopril 20 mg under [place].   [facility] Management Direct Dial [phone]  Fax [phone] [email or web address]

## 2019-09-22 ENCOUNTER — Other Ambulatory Visit: Payer: Self-pay | Admitting: Family Medicine

## 2019-09-22 DIAGNOSIS — Z1231 Encounter for screening mammogram for malignant neoplasm of breast: Secondary | ICD-10-CM

## 2019-10-07 ENCOUNTER — Other Ambulatory Visit: Payer: Self-pay

## 2019-10-07 NOTE — Patient Outreach (Signed)
[facility]) Care Management  10/07/2019  Brenda Patterson [birth year] [id number]   Medication Adherence call to [name] Hippa Identifiers Verify spoke with patient she is past due on Lovastatin 40 mg,Glipizide Er 2.5 mg and Lisinopril 20 mg,patient explain she is taking all three medication on Lisinopril she is only taking 1/2 of tablet daily on Lovastatin she has medication for 20 more days,patient ask to call [facility] on Glipizide [facility] will [facility] out with in 5-7 days.patient said she is going thru hardship her daughter pass (deceased) and it has not been easy for her and her grand son,patient decline and help.[name] is showing past due under [place].   [facility] Management Direct Dial [phone]  Fax [phone] [email or web address]

## 2019-11-13 ENCOUNTER — Telehealth: Payer: Self-pay | Admitting: Family Medicine

## 2019-11-13 NOTE — Telephone Encounter (Signed)
Message Routed to PCP CMA 

## 2019-11-13 NOTE — Telephone Encounter (Signed)
Pt just finished a Rx for meloxicam that she was prescribed 4 years ago and she states she only takes it when needed for the arthritis/ Pt is asking for a refill for meloxicam (MOBIC) 15 MG tablet  To be sent to  [place], [place] Phone:  [phone]  Fax:  [phone]

## 2019-11-14 NOTE — Telephone Encounter (Signed)
Tylenol 500 mg one to two every 8 hours as needed.

## 2019-11-14 NOTE — Telephone Encounter (Signed)
Called patient and gave her the message from Dr. Elease Hashimoto. Patient thanked me and stated that she will probably just use [place] and take the Tylenol only when she needs to as she does not like to take pain medications. Patient verbalized an understanding.

## 2019-11-14 NOTE — Telephone Encounter (Signed)
Called patient and she has taken Tylenol before. She stated that she will need to take daily and asked what her dosage should be? Please advise.  Patient does not want to take Tramadol at this time.

## 2019-11-14 NOTE — Telephone Encounter (Signed)
Please see message. Please advise. Not on current med list.

## 2019-11-14 NOTE — Telephone Encounter (Signed)
Because of her chronic kidney disease, cannot recommend Mobic or any other NSAID at this time.  If Tylenol is not adequate, we may have to look at possible alternatives such as Tramadol.

## 2019-11-17 ENCOUNTER — Other Ambulatory Visit: Payer: Self-pay

## 2019-11-17 ENCOUNTER — Ambulatory Visit
Admission: RE | Admit: 2019-11-17 | Discharge: 2019-11-17 | Disposition: A | Discharge status: Home / Self Care | Payer: Medicare Other | Source: Ambulatory Visit | Attending: Family Medicine | Admitting: Family Medicine

## 2019-11-17 DIAGNOSIS — Z1231 Encounter for screening mammogram for malignant neoplasm of breast: Secondary | ICD-10-CM

## 2020-01-14 ENCOUNTER — Other Ambulatory Visit: Payer: Self-pay | Admitting: Family Medicine

## 2020-01-20 ENCOUNTER — Telehealth: Payer: Self-pay

## 2020-01-20 MED ORDER — LEVOTHYROXINE SODIUM 25 MCG PO CAPS: 25.0000 ug | ORAL_CAPSULE | Freq: Every day | ORAL | 0 refills | Status: DC

## 2020-02-10 MED ORDER — LEVOTHYROXINE SODIUM 25 MCG PO TABS: ORAL_TABLET | ORAL | 1 refills | Status: DC

## 2020-02-10 NOTE — Addendum Note (Signed)
Addended by: Modena Morrow R on: 02/10/2020 11:52 AM   Modules accepted: Orders

## 2020-02-10 NOTE — Telephone Encounter (Signed)
Mediation Refill: Levothyroxine  Pharmacy: [facility]: [phone]   Pt states she has never received the one sent on 3/16

## 2020-03-16 ENCOUNTER — Telehealth: Payer: Self-pay | Admitting: Family Medicine

## 2020-03-16 NOTE — Chronic Care Management (AMB) (Signed)
  Chronic Care Management   Note  03/16/2020 Name: Jaydee Ingman MRN: [id number] DOB: [birth year]  Michelene Gardener Redmann is a 79 y.o. year old female who is a primary care patient of Burchette, Alinda Sierras, MD. I reached out to Jodie Echevaria by phone today in response to a referral sent by Ms. Michelene Gardener Kerrick's PCP, Elease Hashimoto, Alinda Sierras, MD.   Ms. Dedic was given information about Chronic Care Management services today including:  1. CCM service includes personalized support from designated clinical staff supervised by her physician, including individualized plan of care and coordination with other care providers 2. 24/7 contact phone numbers for assistance for urgent and routine care needs. 3. Service will only be billed when office clinical staff spend 20 minutes or more in a month to coordinate care. 4. Only one practitioner may furnish and bill the service in a calendar month. 5. The patient may stop CCM services at any time (effective at the end of the month) by phone call to the office staff.   Patient agreed to services and verbal consent obtained.   Follow up plan:   [place]

## 2020-03-21 ENCOUNTER — Other Ambulatory Visit: Payer: Self-pay | Admitting: Family Medicine

## 2020-04-13 NOTE — Chronic Care Management (AMB) (Deleted)
Chronic Care Management Pharmacy  Name: Brenda Patterson  MRN: [id number] DOB: 11/09/1940    Chief Complaint/ HPI  Brenda Patterson,  79 y.o. , female presents for their {Initial/Follow-up:3041532} CCM visit with the clinical pharmacist {CHL HP Upstream Pharm visit WUJW:[id number]}. 54 years old, daughter got sick - rare thyroid cancer. Passed at 57. 3 kids in total. Husband. 59 years. 2 great grand children! 36 son 67 years old. 1 grandson. 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 Husband worked in [employer]. 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   PCP : [name], MD  Their chronic conditions include: HTM, HLD, type 2 diabetes, elevated TSH, CKD  *** 5/11  Office Visits: 05/26/19 OV - A1c at 6.9. D/c metformin due to renal function. Remains on glipizide. Elevated TSH. Started on levothyroxine 25 mg 1 tablet daily.  Consult Visit: None  Medications: Outpatient Encounter Medications as of 04/20/2020  Medication Sig Note  . albuterol (PROVENTIL HFA;VENTOLIN HFA) 108 (90 Base) MCG/ACT inhaler Inhale 2 puffs into the lungs every 6 (six) hours as needed for wheezing or shortness of breath.   . Ascorbic Acid (VITAMIN C) 1000 MG tablet Take 1,000 mg by mouth daily.   Marland Kitchen aspirin EC 81 MG tablet Take 81 mg by mouth daily.     . Calcium Carbonate (CALCIUM 600 PO) Take 600 mg by mouth 2 (two) times daily.  01/23/2017: Now taking 1200    . carvedilol (COREG) 6.25 MG tablet TAKE 1 TABLET BY MOUTH TWO  TIMES DAILY   . cefUROXime (CEFTIN) 500 MG tablet Take 1 tablet (500 mg total) by mouth 2 (two) times daily. For sinusitis   . cholecalciferol (VITAMIN D) 1000 UNITS tablet Take 1,000 Units by mouth daily.     Marland Kitchen doxycycline (VIBRAMYCIN) 100 MG capsule Take 1 capsule (100 mg total) by mouth 2 (two) times daily.   . fluticasone (FLONASE) 50 MCG/ACT nasal spray Place 2 sprays into both nostrils daily.   Marland Kitchen glipiZIDE (GLUCOTROL XL) 2.5 MG 24 hr tablet  TAKE 1 TABLET BY MOUTH  DAILY WITH BREAKFAST   . hydrochlorothiazide (HYDRODIURIL) 25 MG tablet TAKE 1 TABLET BY MOUTH  DAILY   . levothyroxine (SYNTHROID) 25 MCG tablet Take 1 tablet by mouth once daily with breakfast   . Levothyroxine Sodium 25 MCG CAPS Take 1 capsule (25 mcg total) by mouth daily before breakfast.   . lisinopril (ZESTRIL) 20 MG tablet Take 0.5 tablets (10 mg total) by mouth daily.   Marland Kitchen lovastatin (MEVACOR) 40 MG tablet Take 1 tablet (40 mg total) by mouth at bedtime. Please schedule appointment with labs for further refills on this medication. [phone]   . metFORMIN (GLUCOPHAGE) 500 MG tablet TAKE 2 TABLETS BY MOUTH  EVERY MORNING AND 1 TABLET  EVERY EVENING (Patient not taking: Reported on 11/22/2018)   . Multiple Vitamin (MULTIVITAMIN) tablet Take 1 tablet by mouth daily.     Marland Kitchen NIFEdipine (PROCARDIA-XL/NIFEDICAL-XL) 30 MG 24 hr tablet TAKE 1 TABLET BY MOUTH TWO  TIMES DAILY   . sertraline (ZOLOFT) 50 MG tablet TAKE 1 TABLET BY MOUTH  DAILY    No facility-administered encounter medications on file as of 04/20/2020.     Current Diagnosis/Assessment:  Goals Addressed   None       Hypertension   Office blood pressures are  BP Readings from Last 3 Encounters:  11/22/18 132/84  11/04/18 110/70  10/25/18 130/70    Patient has failed these meds  in the past: felodipine Patient is currently controlled on the following medications:  . Carvedilol 6.25 mg 1 tablet twice a day . HCTZ 25 mg 1 tablet once daily . Lisinopril 20 mg 0.5 tablet once daily . Nifedipine 30 mg 1 tablet twice daily  Patient checks BP at home {CHL HP BP Monitoring Frequency:[phone]}  Patient home BP readings are ranging: ***  We discussed {CHL HP Upstream Pharmacy [phone]}  Plan  Continue {CHL HP Upstream Pharmacy Plans:[phone]}    Hyperlipidemia   Lipid Panel     Component Value Date/Time   CHOL 180 11/04/2018 0935   TRIG 255.0 (H) 11/04/2018 0935   HDL  35.30 (L) 11/04/2018 0935   CHOLHDL 5 11/04/2018 0935   VLDL 51.0 (H) 11/04/2018 0935   LDLCALC 90 04/03/2014 1114   LDLDIRECT 111.0 11/04/2018 0935     The ASCVD Risk score (Goff DC Jr., et al., 2013) failed to calculate for the following reasons:   The systolic blood pressure is missing   Patient has failed these meds in past: Atorvastatin Patient is currently {CHL Controlled/Uncontrolled:[phone]} on the following medications:  . Lovastatin 40 mg 1 tablet daily at bedtime  We discussed:  {CHL HP Upstream Pharmacy [phone]}  Plan  Continue {CHL HP Upstream Pharmacy Plans:[phone]}   Diabetes   Recent Relevant Labs: Lab Results  Component Value Date/Time   HGBA1C 6.9 (H) 05/27/2019 08:23 AM   HGBA1C 6.9 (A) 11/04/2018 09:57 AM   HGBA1C 7.2 (H) 11/04/2018 09:35 AM   GFR 21.40 (L) 06/16/2019 08:05 AM   GFR 20.55 (L) 05/27/2019 08:22 AM     Checking BG: {CHL HP Blood Glucose Monitoring Frequency:[phone]}  Recent FBG Readings: *** Recent pre-meal BG readings: *** Recent 2hr PP BG readings:  *** Recent HS BG readings: ***  Patient has failed these meds in past: metformin Patient is currently {CHL Controlled/Uncontrolled:[phone]} on the following medications:  Marland Kitchen Glipizide 2.5 mg 1 tablet daily with breakfast  Last diabetic Eye exam:  Lab Results  Component Value Date/Time   HMDIABEYEEXA No Retinopathy 12/25/2017 12:00 AM    Last diabetic Foot exam:  Lab Results  Component Value Date/Time   HMDIABFOOTEX  04/14/2015 12:00 AM    Small abrasion right great toe. Otherwise normal exam     We discussed: {CHL HP Upstream Pharmacy [phone]}  Plan  Continue {CHL HP Upstream Pharmacy Plans:[phone]}   Elevated TSH   Lab Results  Component Value Date/Time   TSH 4.25 05/27/2019 08:22 AM   TSH 4.87 (H) 11/22/2018 11:20 AM   TSH 5.69 (H) 11/04/2018 09:35 AM    Patient has failed these meds in past: *** Patient is currently  {CHL Controlled/Uncontrolled:[phone]} on the following medications:  . Levothyroxine 25 mg 1 capsule daily before breakfast  We discussed:  {CHL HP Upstream Pharmacy [phone]}  Plan  Continue {CHL HP Upstream Pharmacy Plans:[phone]}   Acute Recurrent Pansinusitis   Patient has failed these meds in past: *** Patient is currently {CHL Controlled/Uncontrolled:[phone]} on the following medications:  . Albuterol HFA 2 puffs into the lungs every 6 hrs as need for wheezing or shortness of breath . Doxycycline 10 mg 1 capsule twice daily . Fluticasone 50 mg/act 2 sprays into both nostrils daily . Cefuroxime 500 mg 1 tablet twice daily  We discussed:  ***  Plan  Continue {CHL HP Upstream Pharmacy JJHER:[id number]}   Depression   Patient has failed these meds in past: *** Patient is currently {CHL Controlled/Uncontrolled:[phone]} on the following medications:  . Sertraline  50 mg 1 tablet daily  We discussed:  ***  Plan  Continue {CHL HP Upstream Pharmacy FQMKJ:[id number]}   OTCs/Health Maintenance   Patient is currently {CHL Controlled/Uncontrolled:[phone]} on the following medications: Marland Kitchen Multivitamin 1 tablet daily . Vitamin C 1000 mg 1 tablet once daily . Aspirin EC 81 mg 1 tablet daily . Vitamin D 1000 units 1 tablet daily . Calcium carbonate 600 mg 1 tablet twice daily  We discussed:  ***  Plan  Continue {CHL HP Upstream Pharmacy LRJPV:[id number]}   Vaccines   Reviewed and discussed patient's vaccination history.    Immunization History  Administered Date(s) Administered  . Influenza Split 09/01/2011, 08/11/2013  . Influenza Whole 08/06/2009  . Influenza, High Dose Seasonal PF 10/26/2015, 10/23/2016, 10/22/2017, 11/04/2018  . Influenza,inj,Quad PF,6+ Mos 10/21/2014  . Pneumococcal Conjugate-13 10/21/2014  . Pneumococcal Polysaccharide-23 02/25/2009  . Td 11/06/2002    Plan  Recommended patient receive *** vaccine in ***  office/pharmacy.    Medication Management   Pharmacy/Benefits: [facility] / [employer] Adherence:  Pt endorses ***% compliance  We discussed: ***  Plan  {US Pharmacy AJHH:83437}   Follow up: *** month phone visit   Geraldine Contras, PharmD Clinical Pharmacist [facility] [place] [phone]

## 2020-04-15 ENCOUNTER — Telehealth: Payer: Self-pay | Admitting: Family Medicine

## 2020-04-15 ENCOUNTER — Telehealth (INDEPENDENT_AMBULATORY_CARE_PROVIDER_SITE_OTHER): Payer: Medicare Other | Admitting: Family Medicine

## 2020-04-15 ENCOUNTER — Encounter: Payer: Self-pay | Admitting: Family Medicine

## 2020-04-15 VITALS — BP 170/78

## 2020-04-15 DIAGNOSIS — R0981 Nasal congestion: Secondary | ICD-10-CM

## 2020-04-15 DIAGNOSIS — R42 Dizziness and giddiness: Secondary | ICD-10-CM | POA: Diagnosis not present

## 2020-04-15 DIAGNOSIS — R519 Headache, unspecified: Secondary | ICD-10-CM

## 2020-04-15 DIAGNOSIS — I10 Essential (primary) hypertension: Secondary | ICD-10-CM

## 2020-04-15 MED ORDER — DOXYCYCLINE HYCLATE 100 MG PO TABS: 100.0000 mg | ORAL_TABLET | Freq: Two times a day (BID) | ORAL | 0 refills | Status: DC

## 2020-04-15 NOTE — Progress Notes (Signed)
Virtual Visit via Telephone Note  I connected with Brenda Patterson on 04/15/20 at 11:00 AM EDT by telephone and verified that I am speaking with the correct person using two identifiers.   I discussed the limitations, risks, security and privacy concerns of performing an evaluation and management service by telephone and the availability of in person appointments. I also discussed with the patient that there may be a patient responsible charge related to this service. The patient expressed understanding and agreed to proceed.  Location patient: home Location provider: work or home office Participants present for the call: patient, provider, husband Patient did not have a visit in the prior 7 days to address this/these issue(s).   History of Present Illness:  Acute visit for sinus issues: -started 2 weeks ago -symptoms initially included NVD, sinus congestion, sinus drainage, sneezing, cough some dizziness at times which felt like "inner ear" to her, malaise - GI symptoms resolved promptly but sinus issues persistent and now has sinus pain, nasty sinus drainage, PND, poor sleep because of the nasal congestion  -she called 911 yesterday when felt dizzy because her BP was elevated at 170/78, they checked her out and told her everything looked good and her BP came down when they checked it -denies fever, CP, SOB, HA -she takes coreg 6.25 bid, nifedipine 30mg , lisinopril 20 (1 half tablet), hctz 25mg  (1/2 tab) for her BP and had not taken med when checked BP yesterday, was fine after taking -did not check BP today yet, but nurse is coming to check it, she feels like it is fine this morning, no dizziness today -she has not had her COVID19 vaccine -reports wears her mask -she is around a number of family members, she is not sure if they are vaccinated -denies any known sick exposures    Observations/Objective: Patient sounds cheerful and well on the phone. I do not appreciate any  SOB. Speech and thought processing are grossly intact. Patient reported vitals:  Assessment and Plan:  Sinus congestion  Facial pain  Essential hypertension  Dizziness  -we discussed possible serious and likely etiologies, options for evaluation and workup, limitations of telemedicine visit vs in person visit, treatment, treatment risks and precautions. Pt prefers to treat via telemedicine empirically rather then risking or undertaking an in person visit at this moment. Query sinusitis given duration of symptoms and worsening sinus congestion and sinus discomfort. This sometimes can cause dizziness as well, though discussed other causes and advised inperson evaluation if recurs. Also discussed possibility of COVID19, testing, limitation, options. She opted for empiric treatment for possible sinusitis w/ doxy. Also advised monitoring BP and calling PCP office if elevated. Advised PCP follow up early next week to check in and patient agrees to seek prompt in person care if worsening, new symptoms arise, or if is not improving with treatment. Sent message to scheduler to assist with PCP follow up.  Follow Up Instructions:  [id number] 5-10 [id number] 11-20 9443 21-30 I did not refer this patient for an OV in the next 24 hours for this/these issue(s).  I discussed the assessment and treatment plan with the patient. The patient was provided an opportunity to ask questions and all were answered. The patient agreed with the plan and demonstrated an understanding of the instructions.   The patient was advised to call back or seek an in-person evaluation if the symptoms worsen or if the condition fails to improve as anticipated.  I provided 18 minutes of non-face-to-face time during this encounter.  Lucretia Kern, DO

## 2020-04-20 ENCOUNTER — Other Ambulatory Visit: Payer: Self-pay

## 2020-04-20 ENCOUNTER — Ambulatory Visit: Payer: Medicare Other | Admitting: Pharmacist

## 2020-04-20 DIAGNOSIS — I10 Essential (primary) hypertension: Secondary | ICD-10-CM

## 2020-04-20 DIAGNOSIS — E1122 Type 2 diabetes mellitus with diabetic chronic kidney disease: Secondary | ICD-10-CM

## 2020-04-20 DIAGNOSIS — E785 Hyperlipidemia, unspecified: Secondary | ICD-10-CM

## 2020-04-20 NOTE — Chronic Care Management (AMB) (Signed)
Chronic Care Management Pharmacy  Name: Brenda Patterson  MRN: [id number] DOB: 10/21/1941    Chief Complaint/ HPI  Brenda Patterson,  79 y.o. , female presents for their Initial CCM visit with the clinical pharmacist via telephone due to COVID-19 Pandemic. 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 They have been married for 59 years and they are blessed with 3 children. One of her daughters passed away 07/06/2019 due to a rare thyroid cancer at age 69.They have 1 grandson and 2 great grand children. She loves to plant flowers and her husband gardens. 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 She has no acute concerns for this visit.  PCP : [name], MD  Their chronic conditions include: HTM, HLD, type 2 diabetes, elevated TSH, CKD  Office Visits: 05/26/19 OV - A1c at 6.9. D/c metformin due to renal function. Remains on glipizide. Elevated TSH. Started on levothyroxine 25 mg 1 tablet daily.  Consult Visit: None  Medications: Outpatient Encounter Medications as of 04/20/2020  Medication Sig Note  . Ascorbic Acid (VITAMIN C) 1000 MG tablet Take 1,000 mg by mouth daily.   Marland Kitchen aspirin EC 81 MG tablet Take 81 mg by mouth daily.     . Calcium Carbonate (CALCIUM 600 PO) Take 600 mg by mouth 2 (two) times daily.  01/23/2017: Now taking 1200    . carvedilol (COREG) 6.25 MG tablet TAKE 1 TABLET BY MOUTH TWO  TIMES DAILY   . cholecalciferol (VITAMIN D) 1000 UNITS tablet Take 5,000 Units by mouth daily.    Marland Kitchen doxycycline (VIBRA-TABS) 100 MG tablet Take 1 tablet (100 mg total) by mouth 2 (two) times daily.   . fluticasone (FLONASE) 50 MCG/ACT nasal spray Place 2 sprays into both nostrils daily.   Marland Kitchen glipiZIDE (GLUCOTROL XL) 2.5 MG 24 hr tablet TAKE 1 TABLET BY MOUTH  DAILY WITH BREAKFAST   . hydrochlorothiazide (HYDRODIURIL) 25 MG tablet TAKE 1 TABLET BY MOUTH   DAILY (Patient taking differently: 12.5 mg. Takes 0.5 tablet daily)   . levothyroxine (SYNTHROID) 25 MCG tablet Take 1 tablet by mouth once daily with breakfast   . lisinopril (ZESTRIL) 20 MG tablet Take 0.5 tablets (10 mg total) by mouth daily.   Marland Kitchen lovastatin (MEVACOR) 40 MG tablet Take 1 tablet (40 mg total) by mouth at bedtime. Please schedule appointment with labs for further refills on this medication. [phone]   . Multiple Vitamin (MULTIVITAMIN) tablet Take 1 tablet by mouth daily.     Marland Kitchen NIFEdipine (PROCARDIA-XL/NIFEDICAL-XL) 30 MG 24 hr tablet TAKE 1 TABLET BY MOUTH TWO  TIMES DAILY   . sertraline (ZOLOFT) 50 MG tablet TAKE 1 TABLET BY MOUTH  DAILY   . albuterol (PROVENTIL HFA;VENTOLIN HFA) 108 (90 Base) MCG/ACT inhaler Inhale 2 puffs into the lungs every 6 (six) hours as needed for wheezing or shortness of breath.   . cefUROXime (CEFTIN) 500 MG tablet Take 1 tablet (500 mg total) by mouth 2 (two) times daily. For sinusitis (Patient not taking: Reported on 04/20/2020)   . Levothyroxine Sodium 25 MCG CAPS Take 1 capsule (25 mcg total) by mouth daily before breakfast. (Patient not taking: Reported on 04/20/2020)   . metFORMIN (GLUCOPHAGE) 500 MG tablet TAKE 2 TABLETS BY MOUTH  EVERY MORNING AND 1 TABLET  EVERY EVENING (Patient not taking: Reported on 04/20/2020)   . [DISCONTINUED] doxycycline (VIBRAMYCIN) 100 MG  capsule Take 1 capsule (100 mg total) by mouth 2 (two) times daily.    No facility-administered encounter medications on file as of 04/20/2020.     Transportation Needs: No Transportation Needs  . Lack of Transportation (Medical): No  . Lack of Transportation (Non-Medical): No      Physical Activity: Inactive  . Days of Exercise per Week: 0 days  . Minutes of Exercise per Session: 0 min    Current Diagnosis/Assessment:  Goals Addressed            This Visit's Progress   . Chronic Care Management       CARE PLAN ENTRY  Current Barriers:  . Chronic Disease Management  support, education, and care coordination needs related to Hypertension, Hyperlipidemia, and Diabetes   Hypertension . Pharmacist Clinical Goal(s): o Over the next 90 days, patient will work with PharmD and providers to maintain BP goal <140/90 . Current regimen:  . Carvedilol 6.25 mg 1 tablet twice a day . HCTZ 25 mg 1 tablet once daily . Lisinopril 20 mg 0.5 tablet once daily . Nifedipine 30 mg 1 tablet twice daily . Interventions: o Discussed diet and exercise extensively o Discussed the importance of routinely checking her blood pressure . Patient self care activities - Over the next 90 days, patient will: o Check BP once a week, document, and provide at future appointments o Ensure daily salt intake < 2300 mg/day  Hyperlipidemia . Pharmacist Clinical Goal(s): o Over the next 90 days, patient will work with PharmD and providers to maintain LDL goal < 100 . Current regimen:  o Lovastatin 40 mg 1 tablet daily at bedtime . Interventions: o Discussed diet and exercise extensively . Patient self care activities - Over the next 90 days, patient will: o Continue eating heart healthy diet low on cholesterol  Diabetes . Pharmacist Clinical Goal(s): o Over the next 90 days, patient will work with PharmD and providers to maintain A1c goal <7% . Current regimen:  o Glipizide 2.5 mg 1 tablet daily with breakfast . Interventions: o Discussed diet and exercise extensively . Patient self care activities - Over the next 90 days, patient will: o Check blood sugar once daily, document, and provide at future appointments o Contact provider with any episodes of hypoglycemia  Medication management . Pharmacist Clinical Goal(s): o Over the next 90 days, patient will work with PharmD and providers to maintain optimal medication adherence . Current pharmacy: [facility] . Interventions o Comprehensive medication review performed. o Continue current medication management strategy . Patient  self care activities - Over the next 90 days, patient will: o Focus on medication adherence by pill organizer o Take medications as prescribed o Report any questions or concerns to PharmD and/or provider(s)  Initial goal documentation          Hypertension   Office blood pressures are  BP Readings from Last 3 Encounters:  04/15/20 (!) 170/78  11/22/18 132/84  11/04/18 110/70    Patient has failed these meds in the past: felodipine Patient is currently controlled on the following medications:  . Carvedilol 6.25 mg 1 tablet twice a day . HCTZ 25 mg 0.5 tablet once daily  . Lisinopril 20 mg 0.5 tablet once daily . Nifedipine 30 mg 1 tablet twice daily  Patient checks BP at home infrequently  BP stable. Patiey reports losing weight from 256 lbs to 192 lbs in 4 years and she feels really good about it. She mentions just talking HCTZ 25 mg  0.5 tablet a day instead of 1 tablet. Denies checking blood pressure routinely. Loves to eat fruits and vegetables. She mentions eating meat rarely. Denies having routine exercise. Advised to continue eating heart healthy diet and low sodium intake.   Plan  Continue current medications and hearth healthy diet   Hyperlipidemia   Lipid Panel     Component Value Date/Time   CHOL 180 11/04/2018 0935   TRIG 255.0 (H) 11/04/2018 0935   HDL 35.30 (L) 11/04/2018 0935   CHOLHDL 5 11/04/2018 0935   VLDL 51.0 (H) 11/04/2018 0935   [place] 90 04/03/2014 1114   LDLDIRECT 111.0 11/04/2018 0935     The 10-year ASCVD risk score Mikey Bussing DC Jr., et al., 2013) is: 65.9%   Values used to calculate the score:     Age: 85 years     Sex: Female     Is Non-Hispanic African American: No     Diabetic: Yes     Tobacco smoker: No     Systolic Blood Pressure: 284 mmHg     Is BP treated: Yes     HDL Cholesterol: 35.3 mg/dL     Total Cholesterol: 180 mg/dL   Patient has failed these meds in past: Atorvastatin Patient is currently controlled on the  following medications:  . Lovastatin 40 mg 1 tablet daily at bedtime  We discussed diet and exercise extensively. She eats eggs, cereal, toast for breakfast, peanut butter banana sandwich for lunch, baked potatoes, salad, and yogurt for dinner. She loves fruits and vegetables and she is not a big meat eater. No complaints with taking statin.   Plan  Continue current medications and diet low in cholesterol   Diabetes   Recent Relevant Labs: Lab Results  Component Value Date/Time   HGBA1C 6.9 (H) 05/27/2019 08:23 AM   HGBA1C 6.9 (A) 11/04/2018 09:57 AM   HGBA1C 7.2 (H) 11/04/2018 09:35 AM   GFR 21.40 (L) 06/16/2019 08:05 AM   GFR 20.55 (L) 05/27/2019 08:22 AM     Checking BG: Daily  Patient has failed these meds in past: metformin Patient is currently controlled on the following medications:  Marland Kitchen Glipizide 2.5 mg 1 tablet daily with breakfast  Last diabetic Eye exam:  Lab Results  Component Value Date/Time   HMDIABEYEEXA No Retinopathy 12/25/2017 12:00 AM    Last diabetic Foot exam:  Lab Results  Component Value Date/Time   HMDIABFOOTEX  04/14/2015 12:00 AM    Small abrasion right great toe. Otherwise normal exam     A1C within goal < 7%. Patient lost weight which has a huge impact with her blood sugar and blood pressure. Denies eating carbohydrates. Patient has no routine exercise established, but controls her sugar with diet.  Plan  Continue current medications and diet low in carbohydrates   Elevated TSH   Lab Results  Component Value Date/Time   TSH 4.25 05/27/2019 08:22 AM   TSH 4.87 (H) 11/22/2018 11:20 AM   TSH 5.69 (H) 11/04/2018 09:35 AM    Patient has failed these meds in past: none Patient is currently controlled on the following medications:  . Levothyroxine 25 mg 1 capsule daily before breakfast  TSH stable. Will require new lab to reassess. Patient denies symptoms of fatigue or lethargy.  Plan  Continue current medications   Acute Recurrent  Pansinusitis   Patient has failed these meds in past: None Patient is currently controlled on the following medications:  . Albuterol HFA 2 puffs into the lungs every 6 hrs  as need for wheezing or shortness of breath . Doxycycline 100 mg 1 capsule twice daily . Fluticasone 50 mg/act 2 sprays into both nostrils daily  Not using albuterol as much. Uses flonase when allergy starts, but has not been using that for the past days. Will finish her 7 day therapy of doxycycline soon.   Plan  Continue current medications   Depression   Patient has failed these meds in past: None   Patient is currently controlled on the following medications:  . Sertraline 50 mg 1 tablet daily  Patient reports enjoying her time with her great grand kids and that is her interest as of now. She goes out and tries to keep herself busy.  Plan  Continue current medications    OTCs/Health Maintenance   Patient is currently controlled on the following medications: Marland Kitchen Multivitamin 1 tablet daily . Vitamin C 1000 mg 1 tablet once daily . Aspirin EC 81 mg 1 tablet daily . Vitamin D 5000 units 1 tablet daily . Calcium carbonate 600 mg 1 tablet twice daily . Vitamin B 500 mg 1 tablet daily . Fish Oil 1000 mg 1 tablet daily  Plan  Continue current medications    Vaccines   Reviewed and discussed patient's vaccination history.    Immunization History  Administered Date(s) Administered  . Influenza Split 09/01/2011, 08/11/2013  . Influenza Whole 08/06/2009  . Influenza, High Dose Seasonal PF 10/26/2015, 10/23/2016, 10/22/2017, 11/04/2018  . Influenza,inj,Quad PF,6+ Mos 10/21/2014  . Pneumococcal Conjugate-13 10/21/2014  . Pneumococcal Polysaccharide-23 02/25/2009  . Td 11/06/2002    Plan  Recommended patient receive shingrix vaccine in office/pharmacy    Medication Management   Pharmacy[facility]Benefits: [facility] Patient has issues sometimes with [facility], but does not have to pay with anything  due to having no copay with most of her medications.  Plan  Continue current medication management strategy   Follow up: 3 month phone visit   Geraldine Contras, PharmD Clinical Pharmacist [facility] [place] [phone]

## 2020-04-20 NOTE — Chronic Care Management (AMB) (Deleted)
Chronic Care Management Pharmacy  Name: Brenda Patterson  MRN: [id number] DOB: 26-Aug-1941    Chief Complaint/ HPI  Brenda Patterson,  79 y.o. , female presents for their Initial CCM visit with the clinical pharmacist via telephone due to COVID-19 Pandemic. 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 They have been married for 59 years and they are blessed with 3 children. One of her daughters passed away 2019/07/09 due to a rare thyroid cancer at age 59.They have 1 grandson and 2 great grand children. She loves to plant flowers and her husband gardens. 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 She has no acute concerns for this visit.  PCP : [name], MD  Their chronic conditions include: HTM, HLD, type 2 diabetes, elevated TSH, CKD  Office Visits: 05/26/19 OV - A1c at 6.9. D/c metformin due to renal function. Remains on glipizide. Elevated TSH. Started on levothyroxine 25 mg 1 tablet daily.  Consult Visit: None  Medications: Outpatient Encounter Medications as of 04/20/2020  Medication Sig Note  . Ascorbic Acid (VITAMIN C) 1000 MG tablet Take 1,000 mg by mouth daily.   Marland Kitchen aspirin EC 81 MG tablet Take 81 mg by mouth daily.     . Calcium Carbonate (CALCIUM 600 PO) Take 600 mg by mouth 2 (two) times daily.  01/23/2017: Now taking 1200    . carvedilol (COREG) 6.25 MG tablet TAKE 1 TABLET BY MOUTH TWO  TIMES DAILY   . cholecalciferol (VITAMIN D) 1000 UNITS tablet Take 5,000 Units by mouth daily.    Marland Kitchen doxycycline (VIBRA-TABS) 100 MG tablet Take 1 tablet (100 mg total) by mouth 2 (two) times daily.   . fluticasone (FLONASE) 50 MCG/ACT nasal spray Place 2 sprays into both nostrils daily.   Marland Kitchen glipiZIDE (GLUCOTROL XL) 2.5 MG 24 hr tablet TAKE 1 TABLET BY MOUTH  DAILY WITH BREAKFAST   . hydrochlorothiazide (HYDRODIURIL) 25 MG tablet TAKE 1 TABLET BY MOUTH   DAILY (Patient taking differently: 12.5 mg. Takes 0.5 tablet daily)   . levothyroxine (SYNTHROID) 25 MCG tablet Take 1 tablet by mouth once daily with breakfast   . lisinopril (ZESTRIL) 20 MG tablet Take 0.5 tablets (10 mg total) by mouth daily.   Marland Kitchen lovastatin (MEVACOR) 40 MG tablet Take 1 tablet (40 mg total) by mouth at bedtime. Please schedule appointment with labs for further refills on this medication. [phone]   . Multiple Vitamin (MULTIVITAMIN) tablet Take 1 tablet by mouth daily.     Marland Kitchen NIFEdipine (PROCARDIA-XL/NIFEDICAL-XL) 30 MG 24 hr tablet TAKE 1 TABLET BY MOUTH TWO  TIMES DAILY   . sertraline (ZOLOFT) 50 MG tablet TAKE 1 TABLET BY MOUTH  DAILY   . albuterol (PROVENTIL HFA;VENTOLIN HFA) 108 (90 Base) MCG/ACT inhaler Inhale 2 puffs into the lungs every 6 (six) hours as needed for wheezing or shortness of breath.   . cefUROXime (CEFTIN) 500 MG tablet Take 1 tablet (500 mg total) by mouth 2 (two) times daily. For sinusitis (Patient not taking: Reported on 04/20/2020)   . Levothyroxine Sodium 25 MCG CAPS Take 1 capsule (25 mcg total) by mouth daily before breakfast. (Patient not taking: Reported on 04/20/2020)   . metFORMIN (GLUCOPHAGE) 500 MG tablet TAKE 2 TABLETS BY MOUTH  EVERY MORNING AND 1 TABLET  EVERY EVENING (Patient not taking: Reported on 04/20/2020)   . [DISCONTINUED] doxycycline (VIBRAMYCIN) 100 MG  capsule Take 1 capsule (100 mg total) by mouth 2 (two) times daily.    No facility-administered encounter medications on file as of 04/20/2020.     Transportation Needs: No Transportation Needs  . Lack of Transportation (Medical): No  . Lack of Transportation (Non-Medical): No      Physical Activity: Inactive  . Days of Exercise per Week: 0 days  . Minutes of Exercise per Session: 0 min    Current Diagnosis/Assessment:  Goals Addressed            This Visit's Progress   . Chronic Care Management       CARE PLAN ENTRY  Current Barriers:  . Chronic Disease Management  support, education, and care coordination needs related to Hypertension, Hyperlipidemia, and Diabetes   Hypertension . Pharmacist Clinical Goal(s): o Over the next 90 days, patient will work with PharmD and providers to maintain BP goal <140/90 . Current regimen:  . Carvedilol 6.25 mg 1 tablet twice a day . HCTZ 25 mg 1 tablet once daily . Lisinopril 20 mg 0.5 tablet once daily . Nifedipine 30 mg 1 tablet twice daily . Interventions: o Discussed diet and exercise extensively o Discussed the importance of routinely checking her blood pressure . Patient self care activities - Over the next 90 days, patient will: o Check BP once a week, document, and provide at future appointments o Ensure daily salt intake < 2300 mg/day  Hyperlipidemia . Pharmacist Clinical Goal(s): o Over the next 90 days, patient will work with PharmD and providers to maintain LDL goal < 100 . Current regimen:  o Lovastatin 40 mg 1 tablet daily at bedtime . Interventions: o Discussed diet and exercise extensively . Patient self care activities - Over the next 90 days, patient will: o Continue eating heart healthy diet low on cholesterol  Diabetes . Pharmacist Clinical Goal(s): o Over the next 90 days, patient will work with PharmD and providers to maintain A1c goal <7% . Current regimen:  o Glipizide 2.5 mg 1 tablet daily with breakfast . Interventions: o Discussed diet and exercise extensively . Patient self care activities - Over the next 90 days, patient will: o Check blood sugar once daily, document, and provide at future appointments o Contact provider with any episodes of hypoglycemia  Medication management . Pharmacist Clinical Goal(s): o Over the next 90 days, patient will work with PharmD and providers to maintain optimal medication adherence . [facility] . Interventions o Comprehensive medication review performed. o Continue current medication management strategy . Patient  self care activities - Over the next 90 days, patient will: o Focus on medication adherence by pill organizer o Take medications as prescribed o Report any questions or concerns to PharmD and/or provider(s)  Initial goal documentation          Hypertension   Office blood pressures are  BP Readings from Last 3 Encounters:  04/15/20 (!) 170/78  11/22/18 132/84  11/04/18 110/70    Patient has failed these meds in the past: felodipine Patient is currently controlled on the following medications:  . Carvedilol 6.25 mg 1 tablet twice a day . HCTZ 25 mg 0.5 tablet once daily  . Lisinopril 20 mg 0.5 tablet once daily . Nifedipine 30 mg 1 tablet twice daily  Patient checks BP at home infrequently  BP stable. Patiey reports losing weight from 256 lbs to 192 lbs in 4 years and she feels really good about it. She mentions just talking HCTZ 25 mg  0.5 tablet a day instead of 1 tablet. Denies checking blood pressure routinely. Loves to eat fruits and vegetables. She mentions eating meat rarely. Denies having routine exercise. Advised to continue eating heart healthy diet and low sodium intake.   Plan  Continue current medications and hearth healthy diet   Hyperlipidemia   Lipid Panel     Component Value Date/Time   CHOL 180 11/04/2018 0935   TRIG 255.0 (H) 11/04/2018 0935   HDL 35.30 (L) 11/04/2018 0935   CHOLHDL 5 11/04/2018 0935   VLDL 51.0 (H) 11/04/2018 0935   [place] 90 04/03/2014 1114   LDLDIRECT 111.0 11/04/2018 0935     The 10-year ASCVD risk score Mikey Bussing DC Jr., et al., 2013) is: 65.9%   Values used to calculate the score:     Age: 50 years     Sex: Female     Is Non-Hispanic African American: No     Diabetic: Yes     Tobacco smoker: No     Systolic Blood Pressure: 321 mmHg     Is BP treated: Yes     HDL Cholesterol: 35.3 mg/dL     Total Cholesterol: 180 mg/dL   Patient has failed these meds in past: Atorvastatin Patient is currently controlled on the  following medications:  . Lovastatin 40 mg 1 tablet daily at bedtime  We discussed diet and exercise extensively. She eats eggs, cereal, toast for breakfast, peanut butter banana sandwich for lunch, baked potatoes, salad, and yogurt for dinner. She loves fruits and vegetables and she is not a big meat eater. No complaints with taking statin.   Plan  Continue current medications and diet low in cholesterol   Diabetes   Recent Relevant Labs: Lab Results  Component Value Date/Time   HGBA1C 6.9 (H) 05/27/2019 08:23 AM   HGBA1C 6.9 (A) 11/04/2018 09:57 AM   HGBA1C 7.2 (H) 11/04/2018 09:35 AM   GFR 21.40 (L) 06/16/2019 08:05 AM   GFR 20.55 (L) 05/27/2019 08:22 AM     Checking BG: Daily  Patient has failed these meds in past: metformin Patient is currently controlled on the following medications:  Marland Kitchen Glipizide 2.5 mg 1 tablet daily with breakfast  Last diabetic Eye exam:  Lab Results  Component Value Date/Time   HMDIABEYEEXA No Retinopathy 12/25/2017 12:00 AM    Last diabetic Foot exam:  Lab Results  Component Value Date/Time   HMDIABFOOTEX  04/14/2015 12:00 AM    Small abrasion right great toe. Otherwise normal exam     A1C within goal < 7%. Patient lost weight which has a huge impact with her blood sugar and blood pressure. Denies eating carbohydrates. Patient has no routine exercise established, but controls her sugar with diet.  Plan  Continue current medications and diet low in carbohydrates   Elevated TSH   Lab Results  Component Value Date/Time   TSH 4.25 05/27/2019 08:22 AM   TSH 4.87 (H) 11/22/2018 11:20 AM   TSH 5.69 (H) 11/04/2018 09:35 AM    Patient has failed these meds in past: none Patient is currently controlled on the following medications:  . Levothyroxine 25 mg 1 capsule daily before breakfast  TSH stable. Will require new lab to reassess. Patient denies symptoms of fatigue or lethargy.  Plan  Continue current medications   Acute Recurrent  Pansinusitis   Patient has failed these meds in past: None Patient is currently controlled on the following medications:  . Albuterol HFA 2 puffs into the lungs every 6 hrs  as need for wheezing or shortness of breath . Doxycycline 100 mg 1 capsule twice daily . Fluticasone 50 mg/act 2 sprays into both nostrils daily  Not using albuterol as much. Uses flonase when allergy starts, but has not been using that for the past days. Will finish her 7 day therapy of doxycycline soon.   Plan  Continue current medications   Depression   Patient has failed these meds in past: None   Patient is currently controlled on the following medications:  . Sertraline 50 mg 1 tablet daily  Patient reports enjoying her time with her great grand kids and that is her interest as of now. She goes out and tries to keep herself busy.  Plan  Continue current medications    OTCs/Health Maintenance   Patient is currently controlled on the following medications: Marland Kitchen Multivitamin 1 tablet daily . Vitamin C 1000 mg 1 tablet once daily . Aspirin EC 81 mg 1 tablet daily . Vitamin D 5000 units 1 tablet daily . Calcium carbonate 600 mg 1 tablet twice daily . Vitamin B 500 mg 1 tablet daily . Fish Oil 1000 mg 1 tablet daily  Plan  Continue current medications    Vaccines   Reviewed and discussed patient's vaccination history.    Immunization History  Administered Date(s) Administered  . Influenza Split 09/01/2011, 08/11/2013  . Influenza Whole 08/06/2009  . Influenza, High Dose Seasonal PF 10/26/2015, 10/23/2016, 10/22/2017, 11/04/2018  . Influenza,inj,Quad PF,6+ Mos 10/21/2014  . Pneumococcal Conjugate-13 10/21/2014  . Pneumococcal Polysaccharide-23 02/25/2009  . Td 11/06/2002    Plan  Recommended patient receive shingrix vaccine in office/pharmacy    Medication Management   Pharmacy/Benefits: [facility] [place] Patient has issues sometimes with [facility], but does not have to pay with anything  due to having no copay with most of her medications.  Plan  Continue current medication management strategy   Follow up: 3 month phone visit   Geraldine Contras, PharmD Clinical Pharmacist [facility] [place] [phone]

## 2020-04-20 NOTE — Patient Instructions (Addendum)
Visit Information  It was a pleasure meeting you today, Brenda Patterson! I was encouraged with your testimonies and I look forward to helping you maintain the health you have now. Thank you for meeting with me to discuss your medications! I look forward to working with you to achieve your health care goals. Below is a summary of what we talked about during the visit:  Goals Addressed            This Visit's Progress   . Chronic Care Management       CARE PLAN ENTRY  Current Barriers:  . Chronic Disease Management support, education, and care coordination needs related to Hypertension, Hyperlipidemia, and Diabetes   Hypertension . Pharmacist Clinical Goal(s): o Over the next 90 days, patient will work with PharmD and providers to maintain BP goal <140/90 . Current regimen:  . Carvedilol 6.25 mg 1 tablet twice a day . HCTZ 25 mg 1 tablet once daily . Lisinopril 20 mg 0.5 tablet once daily . Nifedipine 30 mg 1 tablet twice daily . Interventions: o Discussed diet and exercise extensively o Discussed the importance of routinely checking her blood pressure . Patient self care activities - Over the next 90 days, patient will: o Check BP once a week, document, and provide at future appointments o Ensure daily salt intake < 2300 mg/day  Hyperlipidemia . Pharmacist Clinical Goal(s): o Over the next 90 days, patient will work with PharmD and providers to maintain LDL goal < 100 . Current regimen:  o Lovastatin 40 mg 1 tablet daily at bedtime . Interventions: o Discussed diet and exercise extensively . Patient self care activities - Over the next 90 days, patient will: o Continue eating heart healthy diet low on cholesterol  Diabetes . Pharmacist Clinical Goal(s): o Over the next 90 days, patient will work with PharmD and providers to maintain A1c goal <7% . Current regimen:  o Glipizide 2.5 mg 1 tablet daily with breakfast . Interventions: o Discussed diet and exercise  extensively . Patient self care activities - Over the next 90 days, patient will: o Check blood sugar once daily, document, and provide at future appointments o Contact provider with any episodes of hypoglycemia  Medication management . Pharmacist Clinical Goal(s): o Over the next 90 days, patient will work with PharmD and providers to maintain optimal medication adherence . Current pharmacy: [facility] . Interventions o Comprehensive medication review performed. o Continue current medication management strategy . Patient self care activities - Over the next 90 days, patient will: o Focus on medication adherence by pill organizer o Take medications as prescribed o Report any questions or concerns to PharmD and/or provider(s)  Initial goal documentation        [name] was given information about Chronic Care Management services today including:  1. CCM service includes personalized support from designated clinical staff supervised by her physician, including individualized plan of care and coordination with other care providers 2. 24/7 contact phone numbers for assistance for urgent and routine care needs. 3. Standard insurance, coinsurance, copays and deductibles apply for chronic care management only during months in which we provide at least 20 minutes of these services. Most insurances cover these services at 100%, however patients may be responsible for any copay, coinsurance and/or deductible if applicable. This service may help you avoid the need for more expensive face-to-face services. 4. Only one practitioner may furnish and bill the service in a calendar month. 5. The patient may stop CCM services at  any time (effective at the end of the month) by phone call to the office staff.  Patient agreed to services and verbal consent obtained.   The patient verbalized understanding of instructions provided today and agreed to receive a mailed copy of patient instruction and/or  educational materials. Telephone follow up appointment with pharmacy team member scheduled for: 07/26/20 at [place], PharmD Clinical Pharmacist [facility] [phone]   Cholesterol Content in Foods Cholesterol is a waxy, fat-like substance that helps to carry fat in the blood. The body needs cholesterol in small amounts, but too much cholesterol can cause damage to the arteries and heart. Most people should eat less than 200 milligrams (mg) of cholesterol a day. Foods with cholesterol  Cholesterol is found in animal-based foods, such as meat, seafood, and dairy. Generally, low-fat dairy and lean meats have less cholesterol than full-fat dairy and fatty meats. The milligrams of cholesterol per serving (mg per serving) of common cholesterol-containing foods are listed below. Meat and other proteins  Egg -- one large whole egg has 186 mg.  Veal shank -- 4 oz has 141 mg.  Lean ground [place] (93% lean) -- 4 oz has 118 mg.  Fat-trimmed lamb loin -- 4 oz has 106 mg.  Lean ground beef (90% lean) -- 4 oz has 100 mg.  Lobster -- 3.5 oz has 90 mg.  Pork loin chops -- 4 oz has 86 mg.  Canned salmon -- 3.5 oz has 83 mg.  Fat-trimmed beef top loin -- 4 oz has 78 mg.  Frankfurter -- 1 frank (3.5 oz) has 77 mg.  Crab -- 3.5 oz has 71 mg.  Roasted chicken without skin, white meat -- 4 oz has 66 mg.  Light bologna -- 2 oz has 45 mg.  Deli-cut [place] -- 2 oz has 31 mg.  Canned tuna -- 3.5 oz has 31 mg.  Berniece Salines -- 1 oz has 29 mg.  Oysters and mussels (raw) -- 3.5 oz has 25 mg.  Mackerel -- 1 oz has 22 mg.  Trout -- 1 oz has 20 mg.  Pork sausage -- 1 link (1 oz) has 17 mg.  Salmon -- 1 oz has 16 mg.  Tilapia -- 1 oz has 14 mg. Dairy  Soft-serve ice cream --  cup (4 oz) has 103 mg.  Whole-milk yogurt -- 1 cup (8 oz) has 29 mg.  Cheddar cheese -- 1 oz has 28 mg.  American cheese -- 1 oz has 28 mg.  Whole milk -- 1 cup (8 oz) has 23  mg.  2% milk -- 1 cup (8 oz) has 18 mg.  Cream cheese -- 1 tablespoon (Tbsp) has 15 mg.  Cottage cheese --  cup (4 oz) has 14 mg.  Low-fat (1%) milk -- 1 cup (8 oz) has 10 mg.  Sour cream -- 1 Tbsp has 8.5 mg.  Low-fat yogurt -- 1 cup (8 oz) has 8 mg.  Nonfat Greek yogurt -- 1 cup (8 oz) has 7 mg.  Half-and-half cream -- 1 Tbsp has 5 mg. Fats and oils  Cod liver oil -- 1 tablespoon (Tbsp) has 82 mg.  Butter -- 1 Tbsp has 15 mg.  Lard -- 1 Tbsp has 14 mg.  Bacon grease -- 1 Tbsp has 14 mg.  Mayonnaise -- 1 Tbsp has 5-10 mg.  Margarine -- 1 Tbsp has 3-10 mg. Exact amounts of cholesterol in these foods may vary depending on specific ingredients and brands. Foods without  cholesterol Most plant-based foods do not have cholesterol unless you combine them with a food that has cholesterol. Foods without cholesterol include:  Grains and cereals.  Vegetables.  Fruits.  Vegetable oils, such as olive, canola, and sunflower oil.  Legumes, such as peas, beans, and lentils.  Nuts and seeds.  Egg whites. Summary  The body needs cholesterol in small amounts, but too much cholesterol can cause damage to the arteries and heart.  Most people should eat less than 200 milligrams (mg) of cholesterol a day. This information is not intended to replace advice given to you by your health care provider. Make sure you discuss any questions you have with your health care provider. Document Revised: 10/05/2017 Document Reviewed: 06/19/2017 Elsevier Patient Education  [place].

## 2020-04-20 NOTE — Chronic Care Management (AMB) (Deleted)
Chronic Care Management Pharmacy  Name: Brenda Patterson  MRN: [id number] DOB: [birth year]    Chief Complaint/ HPI  Brenda Patterson,  79 y.o. , female presents for their Initial CCM visit with the clinical pharmacist via telephone due to COVID-19 Pandemic. 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 They have been married for 59 years and they are blessed with 3 children. One of her daughters passed away 07/04/2019 due to a rare thyroid cancer at age 46.They have 1 grandson and 2 great grand children. She loves to plant flowers and her husband gardens. 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 She has no acute concerns for this visit.  PCP : [name], MD  Their chronic conditions include: HTM, HLD, type 2 diabetes, elevated TSH, CKD  Office Visits: 05/26/19 OV - A1c at 6.9. D/c metformin due to renal function. Remains on glipizide. Elevated TSH. Started on levothyroxine 25 mg 1 tablet daily.  Consult Visit: None  Medications: Outpatient Encounter Medications as of 04/20/2020  Medication Sig Note   Ascorbic Acid (VITAMIN C) 1000 MG tablet Take 1,000 mg by mouth daily.    aspirin EC 81 MG tablet Take 81 mg by mouth daily.      Calcium Carbonate (CALCIUM 600 PO) Take 600 mg by mouth 2 (two) times daily.  01/23/2017: Now taking 1200     carvedilol (COREG) 6.25 MG tablet TAKE 1 TABLET BY MOUTH TWO  TIMES DAILY    cholecalciferol (VITAMIN D) 1000 UNITS tablet Take 5,000 Units by mouth daily.     doxycycline (VIBRA-TABS) 100 MG tablet Take 1 tablet (100 mg total) by mouth 2 (two) times daily.    fluticasone (FLONASE) 50 MCG/ACT nasal spray Place 2 sprays into both nostrils daily.    glipiZIDE (GLUCOTROL XL) 2.5 MG 24 hr tablet TAKE 1 TABLET BY MOUTH  DAILY WITH BREAKFAST    hydrochlorothiazide (HYDRODIURIL) 25 MG tablet TAKE 1 TABLET BY MOUTH   DAILY (Patient taking differently: 12.5 mg. Takes 0.5 tablet daily)    levothyroxine (SYNTHROID) 25 MCG tablet Take 1 tablet by mouth once daily with breakfast    lisinopril (ZESTRIL) 20 MG tablet Take 0.5 tablets (10 mg total) by mouth daily.    lovastatin (MEVACOR) 40 MG tablet Take 1 tablet (40 mg total) by mouth at bedtime. Please schedule appointment with labs for further refills on this medication. [phone]    Multiple Vitamin (MULTIVITAMIN) tablet Take 1 tablet by mouth daily.      NIFEdipine (PROCARDIA-XL/NIFEDICAL-XL) 30 MG 24 hr tablet TAKE 1 TABLET BY MOUTH TWO  TIMES DAILY    sertraline (ZOLOFT) 50 MG tablet TAKE 1 TABLET BY MOUTH  DAILY    albuterol (PROVENTIL HFA;VENTOLIN HFA) 108 (90 Base) MCG/ACT inhaler Inhale 2 puffs into the lungs every 6 (six) hours as needed for wheezing or shortness of breath.    cefUROXime (CEFTIN) 500 MG tablet Take 1 tablet (500 mg total) by mouth 2 (two) times daily. For sinusitis (Patient not taking: Reported on 04/20/2020)    Levothyroxine Sodium 25 MCG CAPS Take 1 capsule (25 mcg total) by mouth daily before breakfast. (Patient not taking: Reported on 04/20/2020)    metFORMIN (GLUCOPHAGE) 500 MG tablet TAKE 2 TABLETS BY MOUTH  EVERY MORNING AND 1 TABLET  EVERY EVENING (Patient not taking: Reported on 04/20/2020)    [DISCONTINUED] doxycycline (VIBRAMYCIN) 100 MG  capsule Take 1 capsule (100 mg total) by mouth 2 (two) times daily.    No facility-administered encounter medications on file as of 04/20/2020.     Transportation Needs: No Data processing manager (Medical): No   Lack of Transportation (Non-Medical): No      Physical Activity: Inactive   Days of Exercise per Week: 0 days   Minutes of Exercise per Session: 0 min    Current Diagnosis/Assessment:  Goals Addressed            This Visit's Progress    Chronic Care Management       CARE PLAN ENTRY  Current Barriers:   Chronic Disease Management  support, education, and care coordination needs related to Hypertension, Hyperlipidemia, and Diabetes   Hypertension  Pharmacist Clinical Goal(s): o Over the next 90 days, patient will work with PharmD and providers to maintain BP goal <140/90  Current regimen:   Carvedilol 6.25 mg 1 tablet twice a day  HCTZ 25 mg 1 tablet once daily  Lisinopril 20 mg 0.5 tablet once daily  Nifedipine 30 mg 1 tablet twice daily  Interventions: o Discussed diet and exercise extensively o Discussed the importance of routinely checking her blood pressure  Patient self care activities - Over the next 90 days, patient will: o Check BP once a week, document, and provide at future appointments o Ensure daily salt intake < 2300 mg/day  Hyperlipidemia  Pharmacist Clinical Goal(s): o Over the next 90 days, patient will work with PharmD and providers to maintain LDL goal < 100  Current regimen:  o Lovastatin 40 mg 1 tablet daily at bedtime  Interventions: o Discussed diet and exercise extensively  Patient self care activities - Over the next 90 days, patient will: o Continue eating heart healthy diet low on cholesterol  Diabetes  Pharmacist Clinical Goal(s): o Over the next 90 days, patient will work with PharmD and providers to maintain A1c goal <7%  Current regimen:  o Glipizide 2.5 mg 1 tablet daily with breakfast  Interventions: o Discussed diet and exercise extensively  Patient self care activities - Over the next 90 days, patient will: o Check blood sugar once daily, document, and provide at future appointments o Contact provider with any episodes of hypoglycemia  Medication management  Pharmacist Clinical Goal(s): o Over the next 90 days, patient will work with PharmD and providers to maintain optimal medication adherence  [facility]  Interventions o Comprehensive medication review performed. o Continue current medication management strategy  Patient  self care activities - Over the next 90 days, patient will: o Focus on medication adherence by pill organizer o Take medications as prescribed o Report any questions or concerns to PharmD and/or provider(s)  Initial goal documentation          Hypertension   Office blood pressures are  BP Readings from Last 3 Encounters:  04/15/20 (!) 170/78  11/22/18 132/84  11/04/18 110/70    Patient has failed these meds in the past: felodipine Patient is currently controlled on the following medications:   Carvedilol 6.25 mg 1 tablet twice a day  HCTZ 25 mg 0.5 tablet once daily   Lisinopril 20 mg 0.5 tablet once daily  Nifedipine 30 mg 1 tablet twice daily  Patient checks BP at home infrequently  BP stable. Patiey reports losing weight from 256 lbs to 192 lbs in 4 years and she feels really good about it. She mentions just talking HCTZ 25 mg  0.5 tablet a day instead of 1 tablet. Denies checking blood pressure routinely. Loves to eat fruits and vegetables. She mentions eating meat rarely. Denies having routine exercise. Advised to continue eating heart healthy diet and low sodium intake.   Plan  Continue current medications and hearth healthy diet   Hyperlipidemia   Lipid Panel     Component Value Date/Time   CHOL 180 11/04/2018 0935   TRIG 255.0 (H) 11/04/2018 0935   HDL 35.30 (L) 11/04/2018 0935   CHOLHDL 5 11/04/2018 0935   VLDL 51.0 (H) 11/04/2018 0935   [place] 90 04/03/2014 1114   LDLDIRECT 111.0 11/04/2018 0935     The 10-year ASCVD risk score Mikey Bussing DC Jr., et al., 2013) is: 65.9%   Values used to calculate the score:     Age: 34 years     Sex: Female     Is Non-Hispanic African American: No     Diabetic: Yes     Tobacco smoker: No     Systolic Blood Pressure: 024 mmHg     Is BP treated: Yes     HDL Cholesterol: 35.3 mg/dL     Total Cholesterol: 180 mg/dL   Patient has failed these meds in past: Atorvastatin Patient is currently controlled on the  following medications:   Lovastatin 40 mg 1 tablet daily at bedtime  We discussed diet and exercise extensively. She eats eggs, cereal, toast for breakfast, peanut butter banana sandwich for lunch, baked potatoes, salad, and yogurt for dinner. She loves fruits and vegetables and she is not a big meat eater. No complaints with taking statin.   Plan  Continue current medications and diet low in cholesterol   Diabetes   Recent Relevant Labs: Lab Results  Component Value Date/Time   HGBA1C 6.9 (H) 05/27/2019 08:23 AM   HGBA1C 6.9 (A) 11/04/2018 09:57 AM   HGBA1C 7.2 (H) 11/04/2018 09:35 AM   GFR 21.40 (L) 06/16/2019 08:05 AM   GFR 20.55 (L) 05/27/2019 08:22 AM     Checking BG: Daily  Patient has failed these meds in past: metformin Patient is currently controlled on the following medications:   Glipizide 2.5 mg 1 tablet daily with breakfast  Last diabetic Eye exam:  Lab Results  Component Value Date/Time   HMDIABEYEEXA No Retinopathy 12/25/2017 12:00 AM    Last diabetic Foot exam:  Lab Results  Component Value Date/Time   HMDIABFOOTEX  04/14/2015 12:00 AM    Small abrasion right great toe. Otherwise normal exam     A1C within goal < 7%. Patient lost weight which has a huge impact with her blood sugar and blood pressure. Denies eating carbohydrates. Patient has no routine exercise established, but controls her sugar with diet.  Plan  Continue current medications and diet low in carbohydrates   Elevated TSH   Lab Results  Component Value Date/Time   TSH 4.25 05/27/2019 08:22 AM   TSH 4.87 (H) 11/22/2018 11:20 AM   TSH 5.69 (H) 11/04/2018 09:35 AM    Patient has failed these meds in past: none Patient is currently controlled on the following medications:   Levothyroxine 25 mg 1 capsule daily before breakfast  TSH stable. Will require new lab to reassess. Patient denies symptoms of fatigue or lethargy.  Plan  Continue current medications   Acute Recurrent  Pansinusitis   Patient has failed these meds in past: None Patient is currently controlled on the following medications:   Albuterol HFA 2 puffs into the lungs every 6 hrs  as need for wheezing or shortness of breath  Doxycycline 100 mg 1 capsule twice daily  Fluticasone 50 mg/act 2 sprays into both nostrils daily  Not using albuterol as much. Uses flonase when allergy starts, but has not been using that for the past days. Will finish her 7 day therapy of doxycycline soon.   Plan  Continue current medications   Depression   Patient has failed these meds in past: None   Patient is currently controlled on the following medications:   Sertraline 50 mg 1 tablet daily  Patient reports enjoying her time with her great grand kids and that is her interest as of now. She goes out and tries to keep herself busy.  Plan  Continue current medications    OTCs/Health Maintenance   Patient is currently controlled on the following medications:  Multivitamin 1 tablet daily  Vitamin C 1000 mg 1 tablet once daily  Aspirin EC 81 mg 1 tablet daily  Vitamin D 5000 units 1 tablet daily  Calcium carbonate 600 mg 1 tablet twice daily  Vitamin B 500 mg 1 tablet daily  Fish Oil 1000 mg 1 tablet daily  Plan  Continue current medications    Vaccines   Reviewed and discussed patient's vaccination history.    Immunization History  Administered Date(s) Administered   Influenza Split 09/01/2011, 08/11/2013   Influenza Whole 08/06/2009   Influenza, High Dose Seasonal PF 10/26/2015, 10/23/2016, 10/22/2017, 11/04/2018   Influenza,inj,Quad PF,6+ Mos 10/21/2014   Pneumococcal Conjugate-13 10/21/2014   Pneumococcal Polysaccharide-23 02/25/2009   Td 11/06/2002    Plan  Recommended patient receive shingrix vaccine in office/pharmacy    Medication Management   Pharmacy/Benefits: [facility] [place] Patient has issues sometimes with [facility], but does not have to pay with anything  due to having no copay with most of her medications.  Plan  Continue current medication management strategy   Follow up: 3 month phone visit   Geraldine Contras, PharmD Clinical Pharmacist [facility] [place] [phone]

## 2020-05-31 ENCOUNTER — Telehealth: Payer: Self-pay | Admitting: Family Medicine

## 2020-05-31 DIAGNOSIS — E1169 Type 2 diabetes mellitus with other specified complication: Secondary | ICD-10-CM

## 2020-05-31 DIAGNOSIS — E1122 Type 2 diabetes mellitus with diabetic chronic kidney disease: Secondary | ICD-10-CM

## 2020-05-31 DIAGNOSIS — E039 Hypothyroidism, unspecified: Secondary | ICD-10-CM

## 2020-05-31 NOTE — Telephone Encounter (Signed)
Pt would needs orders for lab and would like to get yearly labs done prior to making an appt for her yearly exam.Please Advise. [phone] to call pt when orders are in.

## 2020-05-31 NOTE — Telephone Encounter (Signed)
Please advise 

## 2020-05-31 NOTE — Telephone Encounter (Signed)
Labs ordered.

## 2020-06-01 NOTE — Telephone Encounter (Signed)
Patient notified and verbalized understanding. Patient scheduled appointment and stated that she will try to do labs same day as physical so she can make one trip.

## 2020-06-10 ENCOUNTER — Telehealth: Payer: Self-pay | Admitting: Family Medicine

## 2020-06-10 NOTE — Telephone Encounter (Signed)
error 

## 2020-06-22 ENCOUNTER — Other Ambulatory Visit: Payer: Self-pay

## 2020-06-22 ENCOUNTER — Telehealth (INDEPENDENT_AMBULATORY_CARE_PROVIDER_SITE_OTHER): Payer: Medicare Other | Admitting: Family Medicine

## 2020-06-22 ENCOUNTER — Encounter: Payer: Self-pay | Admitting: Family Medicine

## 2020-06-22 ENCOUNTER — Encounter: Payer: Medicare Other | Admitting: Family Medicine

## 2020-06-22 DIAGNOSIS — J019 Acute sinusitis, unspecified: Secondary | ICD-10-CM | POA: Diagnosis not present

## 2020-06-22 DIAGNOSIS — B001 Herpesviral vesicular dermatitis: Secondary | ICD-10-CM | POA: Diagnosis not present

## 2020-06-22 DIAGNOSIS — N183 Chronic kidney disease, stage 3 unspecified: Secondary | ICD-10-CM

## 2020-06-22 DIAGNOSIS — E1122 Type 2 diabetes mellitus with diabetic chronic kidney disease: Secondary | ICD-10-CM

## 2020-06-22 MED ORDER — DOXYCYCLINE HYCLATE 100 MG PO TABS: 100.0000 mg | ORAL_TABLET | Freq: Two times a day (BID) | ORAL | 0 refills | Status: DC

## 2020-06-22 NOTE — Progress Notes (Signed)
Patient ID: Brenda Patterson, female   DOB: Oct 03, 1941, 79 y.o.   MRN: [id number]  This visit type was conducted due to national recommendations for restrictions regarding the COVID-19 pandemic in an effort to limit this patient's exposure and mitigate transmission in our community.   Virtual Visit via Telephone Note  I connected with Brenda Patterson on 06/22/20 at  1:15 PM EDT by telephone and verified that I am speaking with the correct person using two identifiers.   I discussed the limitations, risks, security and privacy concerns of performing an evaluation and management service by telephone and the availability of in person appointments. I also discussed with the patient that there may be a patient responsible charge related to this service. The patient expressed understanding and agreed to proceed.  Location patient: home Location provider: work or home office Participants present for the call: patient, provider Patient did not have a visit in the prior 7 days to address this/these issue(s).   History of Present Illness:  Brenda Patterson called with onset of possible cold sore just under her left nostril recently.  She had cold sores in the past.  She also relates about 2-week history of cough productive of yellow sputum.  She had some sneezing couple weeks ago.  She thinks this was allergic but she is frequently had sinusitis following allergy flares.  She has some facial pain and intermittent headache.  No fever.  Occasional cough as above.  No dyspnea.  She has not had Covid vaccine but no recent sick exposures.  She has not had Covid testing.  She has had previous allergy with penicillin and Zithromax.  She has multiple chronic problems including hypertension, type 2 diabetes, chronic kidney disease, hyperlipidemia.  She is overdue for labs.  She has future labs ordered and has rescheduled her appointment for couple weeks from now  Her daughter passed away almost exactly a year ago from  complications of cancer.  She is still grieving through that.  Past Medical History:  Diagnosis Date  . Allergy   . Arthritis   . 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 Diabetes mellitus   . Hyperlipidemia   . Hypertension   . Migraines    Past Surgical History:  Procedure Laterality Date  . CHOLECYSTECTOMY    . TUBAL LIGATION      reports that she has never smoked. She has never used smokeless tobacco. She reports that she does not drink alcohol and does not use drugs. family history includes Arthritis in an other family member; Breast cancer in her sister; Diabetes in an other family member; Hyperlipidemia in an other family member; Hypertension in an other family member; Stroke in an other family member. Allergies  Allergen Reactions  . Azithromycin Nausea Only  . Heparin     Patient states it makes her weak  . Penicillins Rash    Rash noted inside her mouth and lips      Observations/Objective: Patient sounds cheerful and well on the phone. I do not appreciate any SOB. Speech and thought processing are grossly intact. Patient reported vitals:  Assessment and Plan:  #1 history of recurrent cold sores. -We discussed possible use of Valtrex in the future for prevention.  We will discuss that follow-up visit here in a couple weeks  #2 probable acute sinusitis. -We did mention possible Covid testing but she is fairly certain this is not Covid but more typical of her sinus infections in the  past -Given duration of symptoms we agreed to start doxycycline 100 mg twice daily for 10 days.  Encourage good hydration  #3 type 2 diabetes.  This is been fairly well controlled in the past.  Overdue for follow-up labs.  We will plan to get A1c along with other labs at office follow-up in 2 weeks  Follow Up Instructions:  -As above   99441 5-10 99442 11-20 99443 21-30 I did not refer this patient for an OV in the next 24 hours for this/these issue(s).  I  discussed the assessment and treatment plan with the patient. The patient was provided an opportunity to ask questions and all were answered. The patient agreed with the plan and demonstrated an understanding of the instructions.   The patient was advised to call back or seek an in-person evaluation if the symptoms worsen or if the condition fails to improve as anticipated.  I provided 25 minutes of non-face-to-face time during this encounter.   Carolann Littler, MD

## 2020-06-23 ENCOUNTER — Other Ambulatory Visit: Payer: Self-pay | Admitting: Family Medicine

## 2020-07-06 ENCOUNTER — Encounter: Payer: Medicare Other | Admitting: Family Medicine

## 2020-07-13 ENCOUNTER — Other Ambulatory Visit: Payer: Self-pay

## 2020-07-13 ENCOUNTER — Ambulatory Visit (INDEPENDENT_AMBULATORY_CARE_PROVIDER_SITE_OTHER): Payer: Medicare Other | Admitting: Family Medicine

## 2020-07-13 ENCOUNTER — Encounter: Payer: Self-pay | Admitting: Family Medicine

## 2020-07-13 VITALS — BP 140/60 | HR 71 | Temp 97.9°F | Ht 62.5 in | Wt 192.0 lb

## 2020-07-13 DIAGNOSIS — N183 Chronic kidney disease, stage 3 unspecified: Secondary | ICD-10-CM | POA: Diagnosis not present

## 2020-07-13 DIAGNOSIS — E1122 Type 2 diabetes mellitus with diabetic chronic kidney disease: Secondary | ICD-10-CM | POA: Diagnosis not present

## 2020-07-13 DIAGNOSIS — Z0000 Encounter for general adult medical examination without abnormal findings: Secondary | ICD-10-CM

## 2020-07-13 DIAGNOSIS — Z23 Encounter for immunization: Secondary | ICD-10-CM

## 2020-07-13 NOTE — Progress Notes (Signed)
Established Patient Office Visit  Subjective:  Patient ID: Brenda Patterson, female    DOB: [birth year]  Age: 79 y.o. MRN: [id number]  CC:  Chief Complaint  Patient presents with  . Medicare Wellness    HPI Brenda Patterson presents for physical exam.  This has been a very difficult year for her.  Her daughter who had multiple health complications died this past year.  She has had some increased anxiety symptoms related to that and some avoidance type behaviors.  She does remain on sertraline and hesitates to consider further medication.  Her chronic problems include hypertension, type 2 diabetes, chronic kidney disease stage III, history of recurrent depression, hyperlipidemia.  She still needs flu vaccine.  Her last colonoscopy was apparently 10 years ago.  No history of hepatitis C screening but low risk.  She does get yearly mammograms.  She has declined Covid vaccine.  Declines tetanus.  Social history is that she lives with her husband.  Daughter died of health complications last year as above.  [name] has never smoked.  No alcohol use.  Past Medical History:  Diagnosis Date  . Allergy   . Arthritis   . 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 Diabetes mellitus   . Hyperlipidemia   . Hypertension   . Migraines     Past Surgical History:  Procedure Laterality Date  . CHOLECYSTECTOMY    . TUBAL LIGATION      Family History  Problem Relation Age of Onset  . Arthritis Other        fhx  . Hyperlipidemia Other        fhx  . Hypertension Other        fhx  . Diabetes Other        fhx  . Stroke Other        fhx  . Breast cancer Sister     Social History   Socioeconomic History  . Marital status: Married    Spouse name: Not on file  . Number of children: Not on file  . Years of education: Not on file  . Highest education level: Not on file  Occupational History  . Not on file  Tobacco Use  . Smoking status: Never Smoker  .  Smokeless tobacco: Never Used  Vaping Use  . Vaping Use: Never used  Substance and Sexual Activity  . Alcohol use: No  . Drug use: No  . Sexual activity: Yes  Other Topics Concern  . Not on file  Social History Narrative   Works Arts development officer   Married    Social Determinants of Health   Financial Resource Strain:   . Difficulty of Paying Living Expenses: Not on file  Food Insecurity:   . Worried About Charity fundraiser in the Last Year: Not on file  . Ran Out of Food in the Last Year: Not on file  Transportation Needs: No Transportation Needs  . Lack of Transportation (Medical): No  . Lack of Transportation (Non-Medical): No  Physical Activity: Inactive  . Days of Exercise per Week: 0 days  . Minutes of Exercise per Session: 0 min  Stress:   . Feeling of Stress : Not on file  Social Connections:   . Frequency of Communication with Friends and Family: Not on file  . Frequency of Social Gatherings with Friends and Family: Not on file  . Attends Religious Services: Not on file  .  Active Member of Clubs or Organizations: Not on file  . Attends Archivist Meetings: Not on file  . Marital Status: Not on file  Intimate Partner Violence:   . Fear of Current or Ex-Partner: Not on file  . Emotionally Abused: Not on file  . Physically Abused: Not on file  . Sexually Abused: Not on file    Outpatient Medications Prior to Visit  Medication Sig Dispense Refill  . Ascorbic Acid (VITAMIN C) 1000 MG tablet Take 1,000 mg by mouth daily.    Marland Kitchen aspirin EC 81 MG tablet Take 81 mg by mouth daily.      . Calcium Carbonate (CALCIUM 600 PO) Take 600 mg by mouth 2 (two) times daily.     . carvedilol (COREG) 6.25 MG tablet TAKE 1 TABLET BY MOUTH  TWICE DAILY 180 tablet 3  . cholecalciferol (VITAMIN D) 1000 UNITS tablet Take 5,000 Units by mouth daily.     Marland Kitchen doxycycline (VIBRA-TABS) 100 MG tablet Take 1 tablet (100 mg total) by mouth 2 (two) times daily. 20 tablet 0  .  fluticasone (FLONASE) 50 MCG/ACT nasal spray Place 2 sprays into both nostrils daily. 16 g 2  . hydrochlorothiazide (HYDRODIURIL) 25 MG tablet TAKE 1 TABLET BY MOUTH  DAILY 90 tablet 3  . lisinopril (ZESTRIL) 20 MG tablet Take 0.5 tablets (10 mg total) by mouth daily. 90 tablet 0  . lovastatin (MEVACOR) 40 MG tablet TAKE 1 TABLET BY MOUTH AT  BEDTIME. PLEASE SCHEDULE  APPOINTMENT WITH LABS FOR  FURTHER REFILLS ON THIS  MEDICATION. [phone] 90 tablet 3  . metFORMIN (GLUCOPHAGE) 500 MG tablet TAKE 2 TABLETS BY MOUTH  EVERY MORNING AND 1 TABLET  EVERY EVENING 270 tablet 1  . Multiple Vitamin (MULTIVITAMIN) tablet Take 1 tablet by mouth daily.      Marland Kitchen NIFEdipine (PROCARDIA-XL/NIFEDICAL-XL) 30 MG 24 hr tablet TAKE 1 TABLET BY MOUTH  TWICE DAILY 180 tablet 3  . sertraline (ZOLOFT) 50 MG tablet TAKE 1 TABLET BY MOUTH  DAILY 90 tablet 1  . albuterol (PROVENTIL HFA;VENTOLIN HFA) 108 (90 Base) MCG/ACT inhaler Inhale 2 puffs into the lungs every 6 (six) hours as needed for wheezing or shortness of breath. 1 Inhaler 0  . glipiZIDE (GLUCOTROL XL) 2.5 MG 24 hr tablet TAKE 1 TABLET BY MOUTH  DAILY WITH BREAKFAST (Patient not taking: Reported on 07/13/2020) 90 tablet 3  . levothyroxine (SYNTHROID) 25 MCG tablet Take 1 tablet by mouth once daily with breakfast (Patient not taking: Reported on 07/13/2020) 90 tablet 1  . Levothyroxine Sodium 25 MCG CAPS Take 1 capsule (25 mcg total) by mouth daily before breakfast. (Patient not taking: Reported on 07/13/2020) 90 capsule 0   No facility-administered medications prior to visit.    Allergies  Allergen Reactions  . Azithromycin Nausea Only  . Heparin     Patient states it makes her weak  . Penicillins Rash    Rash noted inside her mouth and lips    ROS Review of Systems  Constitutional: Negative for activity change, appetite change, fatigue, fever and unexpected weight change.  HENT: Negative for ear pain, hearing loss, sore throat and trouble swallowing.   Eyes:  Negative for visual disturbance.  Respiratory: Negative for cough and shortness of breath.   Cardiovascular: Negative for chest pain and palpitations.  Gastrointestinal: Negative for abdominal pain, blood in stool, constipation and diarrhea.  Genitourinary: Negative for dysuria and hematuria.  Musculoskeletal: Positive for arthralgias. Negative for back pain and myalgias.  Skin: Negative for rash.  Neurological: Negative for dizziness, syncope and headaches.  Hematological: Negative for adenopathy.  Psychiatric/Behavioral: Negative for confusion.      Objective:    Physical Exam Vitals reviewed.  Constitutional:      Appearance: Normal appearance.  Cardiovascular:     Rate and Rhythm: Normal rate and regular rhythm.  Pulmonary:     Effort: Pulmonary effort is normal.     Breath sounds: Normal breath sounds.  Abdominal:     Palpations: Abdomen is soft. There is no mass.     Tenderness: There is no abdominal tenderness.  Musculoskeletal:     Cervical back: Neck supple.     Right lower leg: No edema.     Left lower leg: No edema.  Lymphadenopathy:     Cervical: No cervical adenopathy.  Skin:    Comments: She has vitiligo with significant hypopigmentation of many areas  Neurological:     General: No focal deficit present.     Mental Status: She is alert.     BP 140/60   Pulse 71   Temp 97.9 F (36.6 C) (Oral)   Ht 5' 2.5" (1.588 m)   Wt 192 lb (87.1 kg)   SpO2 98%   BMI 34.56 kg/m  Wt Readings from Last 3 Encounters:  07/13/20 192 lb (87.1 kg)  11/22/18 186 lb 8 oz (84.6 kg)  11/04/18 188 lb 8 oz (85.5 kg)     Health Maintenance Due  Topic Date Due  . Hepatitis C Screening  Never done  . HEMOGLOBIN A1C  11/27/2019    There are no preventive care reminders to display for this patient.  Lab Results  Component Value Date   TSH 4.25 05/27/2019   Lab Results  Component Value Date   WBC 9.1 11/04/2018   HGB 10.6 (L) 11/04/2018   HCT 30.6 (L) 11/04/2018     MCV 88.9 11/04/2018   PLT 304.0 11/04/2018   Lab Results  Component Value Date   NA 135 06/16/2019   K 4.0 06/16/2019   CO2 25 06/16/2019   GLUCOSE 170 (H) 06/16/2019   BUN 39 (H) 06/16/2019   CREATININE 2.22 (H) 06/16/2019   BILITOT 0.3 11/04/2018   ALKPHOS 73 11/04/2018   AST 12 11/04/2018   ALT 12 11/04/2018   PROT 6.2 11/04/2018   ALBUMIN 3.7 11/04/2018   CALCIUM 9.0 06/16/2019   ANIONGAP 12 11/05/2017   GFR 21.40 (L) 06/16/2019   Lab Results  Component Value Date   CHOL 180 11/04/2018   Lab Results  Component Value Date   HDL 35.30 (L) 11/04/2018   Lab Results  Component Value Date   LDLCALC 90 04/03/2014   Lab Results  Component Value Date   TRIG 255.0 (H) 11/04/2018   Lab Results  Component Value Date   CHOLHDL 5 11/04/2018   Lab Results  Component Value Date   HGBA1C 6.9 (H) 05/27/2019      Assessment & Plan:   Problem List Items Addressed This Visit      Unprioritized   Type 2 diabetes mellitus, controlled ([place])    Other Visit Diagnoses    Physical exam    -  Primary   Relevant Orders   Basic metabolic panel   Lipid panel   CBC with Differential/Platelet   TSH   Hepatic function panel   Hemoglobin A1c   Hep C Antibody   Need for immunization against influenza       Relevant Orders  Flu Vaccine QUAD High Dose(Fluad) (Completed)    Several health maintenance issues addressed:  -Flu vaccine given -We discussed Cologuard versus repeat colonoscopy.  She will consider the former and get back with [place] if she has insurance coverage -Check labs including hepatitis C antibody -We discussed Covid vaccine at this point she remains undecided.  We have encouraged her to consider this with her high risk -Pneumonia vaccines completed -She will check on coverage for shingles vaccine  No orders of the defined types were placed in this encounter.   Follow-up: No follow-ups on file.    Carolann Littler, MD

## 2020-07-13 NOTE — Patient Instructions (Signed)
Preventive Care 78 Years and Older, Female Preventive care refers to lifestyle choices and visits with your health care provider that can promote health and wellness. This includes:  A yearly physical exam. This is also called an annual well check.  Regular dental and eye exams.  Immunizations.  Screening for certain conditions.  Healthy lifestyle choices, such as diet and exercise. What can I expect for my preventive care visit? Physical exam Your health care provider will check:  Height and weight. These may be used to calculate body mass index (BMI), which is a measurement that tells if you are at a healthy weight.  Heart rate and blood pressure.  Your skin for abnormal spots. Counseling Your health care provider may ask you questions about:  Alcohol, tobacco, and drug use.  Emotional well-being.  Home and relationship well-being.  Sexual activity.  Eating habits.  History of falls.  Memory and ability to understand (cognition).  Work and work Statistician.  Pregnancy and menstrual history. What immunizations do I need?  Influenza (flu) vaccine  This is recommended every year. Tetanus, diphtheria, and pertussis (Tdap) vaccine  You may need a Td booster every 10 years. Varicella (chickenpox) vaccine  You may need this vaccine if you have not already been vaccinated. Zoster (shingles) vaccine  You may need this after age 33. Pneumococcal conjugate (PCV13) vaccine  One dose is recommended after age 33. Pneumococcal polysaccharide (PPSV23) vaccine  One dose is recommended after age 72. Measles, mumps, and rubella (MMR) vaccine  You may need at least one dose of MMR if you were born in 1957 or later. You may also need a second dose. Meningococcal conjugate (MenACWY) vaccine  You may need this if you have certain conditions. Hepatitis A vaccine  You may need this if you have certain conditions or if you travel or work in places where you may be exposed  to hepatitis A. Hepatitis B vaccine  You may need this if you have certain conditions or if you travel or work in places where you may be exposed to hepatitis B. Haemophilus influenzae type b (Hib) vaccine  You may need this if you have certain conditions. You may receive vaccines as individual doses or as more than one vaccine together in one shot (combination vaccines). Talk with your health care provider about the risks and benefits of combination vaccines. What tests do I need? Blood tests  Lipid and cholesterol levels. These may be checked every 5 years, or more frequently depending on your overall health.  Hepatitis C test.  Hepatitis B test. Screening  Lung cancer screening. You may have this screening every year starting at age 39 if you have a 30-pack-year history of smoking and currently smoke or have quit within the past 15 years.  Colorectal cancer screening. All adults should have this screening starting at age 36 and continuing until age 15. Your health care provider may recommend screening at age 23 if you are at increased risk. You will have tests every 1-10 years, depending on your results and the type of screening test.  Diabetes screening. This is done by checking your blood sugar (glucose) after you have not eaten for a while (fasting). You may have this done every 1-3 years.  Mammogram. This may be done every 1-2 years. Talk with your health care provider about how often you should have regular mammograms.  BRCA-related cancer screening. This may be done if you have a family history of breast, ovarian, tubal, or peritoneal cancers.  Other tests  Sexually transmitted disease (STD) testing.  Bone density scan. This is done to screen for osteoporosis. You may have this done starting at age 66. Follow these instructions at home: Eating and drinking  Eat a diet that includes fresh fruits and vegetables, whole grains, lean protein, and low-fat dairy products. Limit  your intake of foods with high amounts of sugar, saturated fats, and salt.  Take vitamin and mineral supplements as recommended by your health care provider.  Do not drink alcohol if your health care provider tells you not to drink.  If you drink alcohol: ? Limit how much you have to 0-1 drink a day. ? Be aware of how much alcohol is in your drink. In the [place] one drink equals one 12 oz bottle of beer (355 mL), one 5 oz glass of wine (148 mL), or one 1 oz glass of hard liquor (44 mL). Lifestyle  Take daily care of your teeth and gums.  Stay active. Exercise for at least 30 minutes on 5 or more days each week.  Do not use any products that contain nicotine or tobacco, such as cigarettes, e-cigarettes, and chewing tobacco. If you need help quitting, ask your health care provider.  If you are sexually active, practice safe sex. Use a condom or other form of protection in order to prevent STIs (sexually transmitted infections).  Talk with your health care provider about taking a low-dose aspirin or statin. What's next?  Go to your health care provider once a year for a well check visit.  Ask your health care provider how often you should have your eyes and teeth checked.  Stay up to date on all vaccines. This information is not intended to replace advice given to you by your health care provider. Make sure you discuss any questions you have with your health care provider. Document Revised: 10/17/2018 Document Reviewed: 10/17/2018 Elsevier Patient Education  2020 [employer].  Consider Cologuard- may want to check on insurance coverage.  Let me know if you want for [place] to order this.

## 2020-07-14 LAB — LIPID PANEL
Cholesterol: 193 mg/dL (ref <200)
HDL: 38 mg/dL — ABNORMAL LOW (ref >=50)
LDL Cholesterol (Calc): 116 mg/dL (calc) — ABNORMAL HIGH
Non-HDL Cholesterol (Calc): 155 mg/dL (calc) — ABNORMAL HIGH (ref <130)
Total CHOL/HDL Ratio: 5.1 (calc) — ABNORMAL HIGH (ref <5.0)
Triglycerides: 274 mg/dL — ABNORMAL HIGH (ref <150)

## 2020-07-14 LAB — CBC WITH DIFFERENTIAL/PLATELET
Absolute Monocytes: 501 cells/uL (ref 200–950)
Basophils Absolute: 39 cells/uL (ref 0–200)
Basophils Relative: 0.6 %
Eosinophils Absolute: 501 cells/uL — ABNORMAL HIGH (ref 15–500)
Eosinophils Relative: 7.7 %
HCT: 31.3 % — ABNORMAL LOW (ref 35.0–45.0)
Hemoglobin: 10.4 g/dL — ABNORMAL LOW (ref 11.7–15.5)
Lymphs Abs: 2321 cells/uL (ref 850–3900)
MCH: 30.7 pg (ref 27.0–33.0)
MCHC: 33.2 g/dL (ref 32.0–36.0)
MCV: 92.3 fL (ref 80.0–100.0)
MPV: 11.8 fL (ref 7.5–12.5)
Monocytes Relative: 7.7 %
Neutro Abs: 3140 cells/uL (ref 1500–7800)
Neutrophils Relative %: 48.3 %
Platelets: 221 10*3/uL (ref 140–400)
RBC: 3.39 10*6/uL — ABNORMAL LOW (ref 3.80–5.10)
RDW: 12.3 % (ref 11.0–15.0)
Total Lymphocyte: 35.7 %
WBC: 6.5 10*3/uL (ref 3.8–10.8)

## 2020-07-14 LAB — BASIC METABOLIC PANEL
BUN/Creatinine Ratio: 12 (calc) (ref 6–22)
BUN: 31 mg/dL — ABNORMAL HIGH (ref 7–25)
CO2: 21 mmol/L (ref 20–32)
Calcium: 8.8 mg/dL (ref 8.6–10.4)
Chloride: 104 mmol/L (ref 98–110)
Creat: 2.55 mg/dL — ABNORMAL HIGH (ref 0.60–0.93)
Glucose, Bld: 214 mg/dL — ABNORMAL HIGH (ref 65–99)
Potassium: 4.4 mmol/L (ref 3.5–5.3)
Sodium: 136 mmol/L (ref 135–146)

## 2020-07-14 LAB — HEPATIC FUNCTION PANEL
AG Ratio: 1.4 (calc) (ref 1.0–2.5)
ALT: 11 U/L (ref 6–29)
AST: 16 U/L (ref 10–35)
Albumin: 3.6 g/dL (ref 3.6–5.1)
Alkaline phosphatase (APISO): 70 U/L (ref 37–153)
Bilirubin, Direct: 0.1 mg/dL (ref 0.0–0.2)
Globulin: 2.5 g/dL (calc) (ref 1.9–3.7)
Indirect Bilirubin: 0.4 mg/dL (calc) (ref 0.2–1.2)
Total Bilirubin: 0.5 mg/dL (ref 0.2–1.2)
Total Protein: 6.1 g/dL (ref 6.1–8.1)

## 2020-07-14 LAB — TSH: TSH: 5.12 mIU/L — ABNORMAL HIGH (ref 0.40–4.50)

## 2020-07-14 LAB — HEMOGLOBIN A1C
Hgb A1c MFr Bld: 7.5 % of total Hgb — ABNORMAL HIGH (ref <5.7)
Mean Plasma Glucose: 169 (calc)
eAG (mmol/L): 9.3 (calc)

## 2020-07-14 LAB — HEPATITIS C ANTIBODY
Hepatitis C Ab: NONREACTIVE
SIGNAL TO CUT-OFF: 0.01 (ref <1.00)

## 2020-07-19 NOTE — Addendum Note (Signed)
Addended by: Gwenyth Ober R on: 07/19/2020 01:38 PM   Modules accepted: Orders

## 2020-07-20 ENCOUNTER — Ambulatory Visit (INDEPENDENT_AMBULATORY_CARE_PROVIDER_SITE_OTHER): Payer: Medicare Other

## 2020-07-20 ENCOUNTER — Other Ambulatory Visit: Payer: Self-pay

## 2020-07-20 DIAGNOSIS — Z0000 Encounter for general adult medical examination without abnormal findings: Secondary | ICD-10-CM | POA: Diagnosis not present

## 2020-07-20 NOTE — Progress Notes (Signed)
Subjective:   Brenda Patterson is a 79 y.o. female who presents for Medicare Annual (Subsequent) preventive examination.   I connected with Tyler Deis today by telephone and verified that I am speaking with the correct person using two identifiers. Location patient: home Location provider: work Persons participating in the virtual visit: patient, provider.   I discussed the limitations, risks, security and privacy concerns of performing an evaluation and management service by telephone and the availability of in person appointments. I also discussed with the patient that there may be a patient responsible charge related to this service. The patient expressed understanding and verbally consented to this telephonic visit.    Interactive audio and video telecommunications were attempted between this provider and patient, however failed, due to patient having technical difficulties OR patient did not have access to video capability.  We continued and completed visit with audio only.     Review of Systems    N/A Cardiac Risk Factors include: advanced age (>42men, >71 women);diabetes mellitus;hypertension     Objective:    Today's Vitals   There is no height or weight on file to calculate BMI.  Advanced Directives 07/20/2020 11/05/2017 01/23/2017 06/26/2015 12/26/2014 12/26/2014  Does Patient Have a Medical Advance Directive? No No No No No No  Would patient like information on creating a medical advance directive? No - Patient declined - - No - patient declined information No - patient declined information -    Current Medications (verified) Outpatient Encounter Medications as of 07/20/2020  Medication Sig  . Ascorbic Acid (VITAMIN C) 1000 MG tablet Take 1,000 mg by mouth daily.  Marland Kitchen aspirin EC 81 MG tablet Take 81 mg by mouth daily.    . Calcium Carbonate (CALCIUM 600 PO) Take 600 mg by mouth 2 (two) times daily.   . carvedilol (COREG) 6.25 MG tablet TAKE 1 TABLET BY MOUTH   TWICE DAILY  . cholecalciferol (VITAMIN D) 1000 UNITS tablet Take 5,000 Units by mouth daily.   . fluticasone (FLONASE) 50 MCG/ACT nasal spray Place 2 sprays into both nostrils daily.  Marland Kitchen glipiZIDE (GLUCOTROL XL) 2.5 MG 24 hr tablet TAKE 1 TABLET BY MOUTH  DAILY WITH BREAKFAST  . hydrochlorothiazide (HYDRODIURIL) 25 MG tablet TAKE 1 TABLET BY MOUTH  DAILY  . levothyroxine (SYNTHROID) 25 MCG tablet Take 1 tablet by mouth once daily with breakfast  . lisinopril (ZESTRIL) 20 MG tablet Take 0.5 tablets (10 mg total) by mouth daily.  Marland Kitchen lovastatin (MEVACOR) 40 MG tablet TAKE 1 TABLET BY MOUTH AT  BEDTIME. PLEASE SCHEDULE  APPOINTMENT WITH LABS FOR  FURTHER REFILLS ON THIS  MEDICATION. [phone]  . Multiple Vitamin (MULTIVITAMIN) tablet Take 1 tablet by mouth daily.    Marland Kitchen NIFEdipine (PROCARDIA-XL/NIFEDICAL-XL) 30 MG 24 hr tablet TAKE 1 TABLET BY MOUTH  TWICE DAILY  . sertraline (ZOLOFT) 50 MG tablet TAKE 1 TABLET BY MOUTH  DAILY  . albuterol (PROVENTIL HFA;VENTOLIN HFA) 108 (90 Base) MCG/ACT inhaler Inhale 2 puffs into the lungs every 6 (six) hours as needed for wheezing or shortness of breath.  . [DISCONTINUED] doxycycline (VIBRA-TABS) 100 MG tablet Take 1 tablet (100 mg total) by mouth 2 (two) times daily.  . [DISCONTINUED] Levothyroxine Sodium 25 MCG CAPS Take 1 capsule (25 mcg total) by mouth daily before breakfast. (Patient not taking: Reported on 07/13/2020)   No facility-administered encounter medications on file as of 07/20/2020.    Allergies (verified) Azithromycin, Heparin, and Penicillins   History: Past Medical History:  Diagnosis Date  .  Allergy   . Arthritis   . 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 Diabetes mellitus   . Hyperlipidemia   . Hypertension   . Migraines    Past Surgical History:  Procedure Laterality Date  . CHOLECYSTECTOMY    . TUBAL LIGATION     Family History  Problem Relation Age of Onset  . Arthritis Other        fhx  .  Hyperlipidemia Other        fhx  . Hypertension Other        fhx  . Diabetes Other        fhx  . Stroke Other        fhx  . Breast cancer Sister    Social History   Socioeconomic History  . Marital status: Married    Spouse name: Not on file  . Number of children: Not on file  . Years of education: Not on file  . Highest education level: Not on file  Occupational History  . Not on file  Tobacco Use  . Smoking status: Never Smoker  . Smokeless tobacco: Never Used  Vaping Use  . Vaping Use: Never used  Substance and Sexual Activity  . Alcohol use: No  . Drug use: No  . Sexual activity: Yes  Other Topics Concern  . Not on file  Social History Narrative   Works Arts development officer   Married    Social Determinants of Health   Financial Resource Strain: [place]   . Difficulty of Paying Living Expenses: Not hard at all  Food Insecurity: No Food Insecurity  . Worried About Charity fundraiser in the Last Year: Never true  . Ran Out of Food in the Last Year: Never true  Transportation Needs: No Transportation Needs  . Lack of Transportation (Medical): No  . Lack of Transportation (Non-Medical): No  Physical Activity: Inactive  . Days of Exercise per Week: 0 days  . Minutes of Exercise per Session: 0 min  Stress: No Stress Concern Present  . Feeling of Stress : Not at all  Social Connections: Moderately Isolated  . Frequency of Communication with Friends and Family: More than three times a week  . Frequency of Social Gatherings with Friends and Family: More than three times a week  . Attends Religious Services: Never  . Active Member of Clubs or Organizations: No  . Attends Archivist Meetings: Never  . Marital Status: Married    Tobacco Counseling Counseling given: Not Answered   Clinical Intake:  Pre-visit preparation completed: Yes  Pain : No/denies pain     Nutritional Risks: None Diabetes: Yes (Patient states does not check her glucose at  home) CBG done?: No Did pt. bring in CBG monitor from home?: No  How often do you need to have someone help you when you read instructions, pamphlets, or other written materials from your doctor or pharmacy?: 1 - Never What is the last grade level you completed in school?: [place]  Diabetic?Yes  Interpreter Needed?: No  Information entered by :: [place] of Daily Living In your present state of health, do you have any difficulty performing the following activities: 07/20/2020  Hearing? N  Vision? N  Difficulty concentrating or making decisions? N  Walking or climbing stairs? N  Dressing or bathing? N  Doing errands, shopping? Y  Comment recently due to deaths in her family she has  issues with going places alone can do it but anxiety has stopped her at the time  Preparing Food and eating ? N  Using the Toilet? N  In the past six months, have you accidently leaked urine? Y  Comment States has occassional bladder leakage  Do you have problems with loss of bowel control? N  Managing your Medications? N  Managing your Finances? N  Housekeeping or managing your Housekeeping? N  Some recent data might be hidden    Patient Care Team: [name], MD as PCP - General [place], Sharma Covert, [employer] (Inactive) as Pharmacist (Pharmacist)  Indicate any recent Medical Services you may have received from other than [facility] providers in the past year (date may be approximate).     Assessment:   This is a routine wellness examination for Brenda Patterson.  Hearing/Vision screen  Hearing Screening   125Hz  250Hz  500Hz  1000Hz  2000Hz  3000Hz  4000Hz  6000Hz  8000Hz   Right ear:           Left ear:           Vision Screening Comments: Patient states gets eyes checked yearly. States she missed her eye exam last year due to [place] issues and exercise activities discussed: Current Exercise Habits: The patient does not participate in regular exercise at present, Exercise limited  by: psychological condition(s)  Goals    . Chronic Care Management     CARE PLAN ENTRY  Current Barriers:  . Chronic Disease Management support, education, and care coordination needs related to Hypertension, Hyperlipidemia, and Diabetes   Hypertension . Pharmacist Clinical Goal(s): o Over the next 90 days, patient will work with PharmD and providers to maintain BP goal <140/90 . Current regimen:  . Carvedilol 6.25 mg 1 tablet twice a day . HCTZ 25 mg 1 tablet once daily . Lisinopril 20 mg 0.5 tablet once daily . Nifedipine 30 mg 1 tablet twice daily . Interventions: o Discussed diet and exercise extensively o Discussed the importance of routinely checking her blood pressure . Patient self care activities - Over the next 90 days, patient will: o Check BP once a week, document, and provide at future appointments o Ensure daily salt intake < 2300 mg/day  Hyperlipidemia . Pharmacist Clinical Goal(s): o Over the next 90 days, patient will work with PharmD and providers to maintain LDL goal < 100 . Current regimen:  o Lovastatin 40 mg 1 tablet daily at bedtime . Interventions: o Discussed diet and exercise extensively . Patient self care activities - Over the next 90 days, patient will: o Continue eating heart healthy diet low on cholesterol  Diabetes . Pharmacist Clinical Goal(s): o Over the next 90 days, patient will work with PharmD and providers to maintain A1c goal <7% . Current regimen:  o Glipizide 2.5 mg 1 tablet daily with breakfast . Interventions: o Discussed diet and exercise extensively . Patient self care activities - Over the next 90 days, patient will: o Check blood sugar once daily, document, and provide at future appointments o Contact provider with any episodes of hypoglycemia  Medication management . Pharmacist Clinical Goal(s): o Over the next 90 days, patient will work with PharmD and providers to maintain optimal medication adherence . Current  pharmacy: [facility] . Interventions o Comprehensive medication review performed. o Continue current medication management strategy . Patient self care activities - Over the next 90 days, patient will: o Focus on medication adherence by pill organizer o Take medications as prescribed o Report any questions  or concerns to PharmD and/or provider(s)  Initial goal documentation     . Exercise 150 minutes per week (moderate activity)     Will try to join silver sneakers this fall Keep busy       Depression Screen PHQ 2/9 Scores 07/20/2020 11/04/2018 01/23/2017 01/12/2017 12/27/2015 10/21/2014  PHQ - 2 Score 0 0 0 0 0 0  PHQ- 9 Score 0 - - - - -    Fall Risk Fall Risk  07/20/2020 11/04/2018 01/23/2017 01/12/2017 05/01/2016  Falls in the past year? 0 0 No No Yes  Number falls in past yr: 0 - - - 1  Injury with Fall? 0 - - - Yes  Risk for fall due to : Medication side effect - - - -  Follow up Falls evaluation completed;Falls prevention discussed - - - Education provided;Falls evaluation completed    Any stairs in or around the home? Yes  If so, are there any without handrails? No  Home free of loose throw rugs in walkways, pet beds, electrical cords, etc? Yes  Adequate lighting in your home to reduce risk of falls? Yes   ASSISTIVE DEVICES UTILIZED TO PREVENT FALLS:  Life alert? No  Use of a cane, walker or w/c? No  Grab bars in the bathroom? No  Shower chair or bench in shower? No  Elevated toilet seat or a handicapped toilet? No     Cognitive Function: Cognitive screening not indicated based on direct observation MMSE - Mini Mental State Exam 01/23/2017  Not completed: (No Data)        Immunizations Immunization History  Administered Date(s) Administered  . Fluad Quad(high Dose 65+) 07/13/2020  . Influenza Split 09/01/2011, 08/11/2013  . Influenza Whole 08/06/2009  . Influenza, High Dose Seasonal PF 10/26/2015, 10/23/2016, 10/22/2017, 11/04/2018  . Influenza,inj,Quad  PF,6+ Mos 10/21/2014  . Pneumococcal Conjugate-13 10/21/2014  . Pneumococcal Polysaccharide-23 02/25/2009  . Td 11/06/2002    TDAP status: Due, Education has been provided regarding the importance of this vaccine. Advised may receive this vaccine at local pharmacy or Health Dept. Aware to provide a copy of the vaccination record if obtained from local pharmacy or Health Dept. Verbalized acceptance and understanding. Flu Vaccine status: Up to date Pneumococcal vaccine status: Up to date Covid-19 vaccine status: Declined, Education has been provided regarding the importance of this vaccine but patient still declined. Advised may receive this vaccine at local pharmacy or Health Dept.or vaccine clinic. Aware to provide a copy of the vaccination record if obtained from local pharmacy or Health Dept. Verbalized acceptance and understanding.  Qualifies for Shingles Vaccine? Yes   Zostavax completed No   Shingrix Completed?: No.    Education has been provided regarding the importance of this vaccine. Patient has been advised to call insurance company to determine out of pocket expense if they have not yet received this vaccine. Advised may also receive vaccine at local pharmacy or Health Dept. Verbalized acceptance and understanding.  Screening Tests Health Maintenance  Topic Date Due  . COVID-19 Vaccine (1) 07/29/2020 (Originally 02/10/1953)  . OPHTHALMOLOGY EXAM  12/23/2020 (Originally 12/25/2018)  . TETANUS/TDAP  07/13/2021 (Originally 11/06/2012)  . HEMOGLOBIN A1C  01/10/2021  . FOOT EXAM  07/13/2021  . INFLUENZA VACCINE  Completed  . DEXA SCAN  Completed  . Hepatitis C Screening  Completed  . PNA vac Low Risk Adult  Completed    Health Maintenance  There are no preventive care reminders to display for this patient.  Colorectal cancer screening:  No longer required.  Mammogram status: Completed 11/17/2019. Repeat every year Bone Density status: Completed 09/06/2017. Results reflect: Bone  density results: OSTEOPENIA. Repeat every 5 years.  Lung Cancer Screening: (Low Dose CT Chest recommended if Age 47-80 years, 30 pack-year currently smoking OR have quit w/in 15years.) does not qualify.   Lung Cancer Screening Referral: N/A  Additional Screening:  Hepatitis C Screening: does qualify; Completed 07/13/2020  Vision Screening: Recommended annual ophthalmology exams for early detection of glaucoma and other disorders of the eye. Is the patient up to date with their annual eye exam?  No  Who is the provider or what is the name of the office in which the patient attends annual eye exams? Dr. Katy Fitch  If pt is not established with a provider, would they like to be referred to a provider to establish care? No .   Dental Screening: Recommended annual dental exams for proper oral hygiene  Community Resource Referral / Chronic Care Management: CRR required this visit?  No   CCM required this visit?  No      Plan:     I have personally reviewed and noted the following in the patient's chart:   . Medical and social history . Use of alcohol, tobacco or illicit drugs  . Current medications and supplements . Functional ability and status . Nutritional status . Physical activity . Advanced directives . List of other physicians . Hospitalizations, surgeries, and ER visits in previous 12 months . Vitals . Screenings to include cognitive, depression, and falls . Referrals and appointments  In addition, I have reviewed and discussed with patient certain preventive protocols, quality metrics, and best practice recommendations. A written personalized care plan for preventive services as well as general preventive health recommendations were provided to patient.     Ofilia Neas, LPN   [id number]   Nurse Notes: None

## 2020-07-20 NOTE — Patient Instructions (Addendum)
Brenda Patterson , Thank you for taking time to come for your Medicare Wellness Visit. I appreciate your ongoing commitment to your health goals. Please review the following plan we discussed and let me know if I can assist you in the future.   Screening recommendations/referrals: Colonoscopy: No longer required  Mammogram: Up to date, next due 11/16/2020 Bone Density: Up to date, next due  09/06/2022 Recommended yearly ophthalmology/optometry visit for glaucoma screening and checkup Recommended yearly dental visit for hygiene and checkup  Vaccinations: Influenza vaccine: Up to date, next due fall 2022 Pneumococcal vaccine: Completed series  Tdap vaccine: Currently due, you may contact your insurance to discuss the cost or await and injury to receive  Shingles vaccine: Currently due, you may contact your pharmacy to discuss cost and to receive vaccines     Advanced directives: Advance directive discussed with you today. Even though you declined this today please call our office should you change your mind and we can give you the proper paperwork for you to fill out.   Conditions/risks identified: None   Next appointment: 08/10/2020 @ 8:30 am with Dr .Elease Hashimoto   Preventive Care 65 Years and Older, Female Preventive care refers to lifestyle choices and visits with your health care provider that can promote health and wellness. What does preventive care include?  A yearly physical exam. This is also called an annual well check.  Dental exams once or twice a year.  Routine eye exams. Ask your health care provider how often you should have your eyes checked.  Personal lifestyle choices, including:  Daily care of your teeth and gums.  Regular physical activity.  Eating a healthy diet.  Avoiding tobacco and drug use.  Limiting alcohol use.  Practicing safe sex.  Taking low-dose aspirin every day.  Taking vitamin and mineral supplements as recommended by your health care  provider. What happens during an annual well check? The services and screenings done by your health care provider during your annual well check will depend on your age, overall health, lifestyle risk factors, and family history of disease. Counseling  Your health care provider may ask you questions about your:  Alcohol use.  Tobacco use.  Drug use.  Emotional well-being.  Home and relationship well-being.  Sexual activity.  Eating habits.  History of falls.  Memory and ability to understand (cognition).  Work and work Statistician.  Reproductive health. Screening  You may have the following tests or measurements:  Height, weight, and BMI.  Blood pressure.  Lipid and cholesterol levels. These may be checked every 5 years, or more frequently if you are over 48 years old.  Skin check.  Lung cancer screening. You may have this screening every year starting at age 88 if you have a 30-pack-year history of smoking and currently smoke or have quit within the past 15 years.  Fecal occult blood test (FOBT) of the stool. You may have this test every year starting at age 12.  Flexible sigmoidoscopy or colonoscopy. You may have a sigmoidoscopy every 5 years or a colonoscopy every 10 years starting at age 54.  Hepatitis C blood test.  Hepatitis B blood test.  Sexually transmitted disease (STD) testing.  Diabetes screening. This is done by checking your blood sugar (glucose) after you have not eaten for a while (fasting). You may have this done every 1-3 years.  Bone density scan. This is done to screen for osteoporosis. You may have this done starting at age 27.  Mammogram. This may  be done every 1-2 years. Talk to your health care provider about how often you should have regular mammograms. Talk with your health care provider about your test results, treatment options, and if necessary, the need for more tests. Vaccines  Your health care provider may recommend certain  vaccines, such as:  Influenza vaccine. This is recommended every year.  Tetanus, diphtheria, and acellular pertussis (Tdap, Td) vaccine. You may need a Td booster every 10 years.  Zoster vaccine. You may need this after age 78.  Pneumococcal 13-valent conjugate (PCV13) vaccine. One dose is recommended after age 62.  Pneumococcal polysaccharide (PPSV23) vaccine. One dose is recommended after age 56. Talk to your health care provider about which screenings and vaccines you need and how often you need them. This information is not intended to replace advice given to you by your health care provider. Make sure you discuss any questions you have with your health care provider. Document Released: 11/19/2015 Document Revised: 07/12/2016 Document Reviewed: 08/24/2015 Elsevier Interactive Patient Education  2017 [place] Prevention in the Home Falls can cause injuries. They can happen to people of all ages. There are many things you can do to make your home safe and to help prevent falls. What can I do on the outside of my home?  Regularly fix the edges of walkways and driveways and fix any cracks.  Remove anything that might make you trip as you walk through a door, such as a raised step or threshold.  Trim any bushes or trees on the path to your home.  Use bright outdoor lighting.  Clear any walking paths of anything that might make someone trip, such as rocks or tools.  Regularly check to see if handrails are loose or broken. Make sure that both sides of any steps have handrails.  Any raised decks and porches should have guardrails on the edges.  Have any leaves, snow, or ice cleared regularly.  Use sand or salt on walking paths during winter.  Clean up any spills in your garage right away. This includes oil or grease spills. What can I do in the bathroom?  Use night lights.  Install grab bars by the toilet and in the tub and shower. Do not use towel bars as grab  bars.  Use non-skid mats or decals in the tub or shower.  If you need to sit down in the shower, use a plastic, non-slip stool.  Keep the floor dry. Clean up any water that spills on the floor as soon as it happens.  Remove soap buildup in the tub or shower regularly.  Attach bath mats securely with double-sided non-slip rug tape.  Do not have throw rugs and other things on the floor that can make you trip. What can I do in the bedroom?  Use night lights.  Make sure that you have a light by your bed that is easy to reach.  Do not use any sheets or blankets that are too big for your bed. They should not hang down onto the floor.  Have a firm chair that has side arms. You can use this for support while you get dressed.  Do not have throw rugs and other things on the floor that can make you trip. What can I do in the kitchen?  Clean up any spills right away.  Avoid walking on wet floors.  Keep items that you use a lot in easy-to-reach places.  If you need to reach something above you,  use a strong step stool that has a grab bar.  Keep electrical cords out of the way.  Do not use floor polish or wax that makes floors slippery. If you must use wax, use non-skid floor wax.  Do not have throw rugs and other things on the floor that can make you trip. What can I do with my stairs?  Do not leave any items on the stairs.  Make sure that there are handrails on both sides of the stairs and use them. Fix handrails that are broken or loose. Make sure that handrails are as long as the stairways.  Check any carpeting to make sure that it is firmly attached to the stairs. Fix any carpet that is loose or worn.  Avoid having throw rugs at the top or bottom of the stairs. If you do have throw rugs, attach them to the floor with carpet tape.  Make sure that you have a light switch at the top of the stairs and the bottom of the stairs. If you do not have them, ask someone to add them for  you. What else can I do to help prevent falls?  Wear shoes that:  Do not have high heels.  Have rubber bottoms.  Are comfortable and fit you well.  Are closed at the toe. Do not wear sandals.  If you use a stepladder:  Make sure that it is fully opened. Do not climb a closed stepladder.  Make sure that both sides of the stepladder are locked into place.  Ask someone to hold it for you, if possible.  Clearly mark and make sure that you can see:  Any grab bars or handrails.  First and last steps.  Where the edge of each step is.  Use tools that help you move around (mobility aids) if they are needed. These include:  Canes.  Walkers.  Scooters.  Crutches.  Turn on the lights when you go into a dark area. Replace any light bulbs as soon as they burn out.  Set up your furniture so you have a clear path. Avoid moving your furniture around.  If any of your floors are uneven, fix them.  If there are any pets around you, be aware of where they are.  Review your medicines with your doctor. Some medicines can make you feel dizzy. This can increase your chance of falling. Ask your doctor what other things that you can do to help prevent falls. This information is not intended to replace advice given to you by your health care provider. Make sure you discuss any questions you have with your health care provider. Document Released: 08/19/2009 Document Revised: 03/30/2016 Document Reviewed: 11/27/2014 Elsevier Interactive Patient Education  2017 [employer].

## 2020-07-23 ENCOUNTER — Telehealth: Payer: Self-pay

## 2020-07-23 NOTE — Progress Notes (Signed)
Spoke to patient to confirmed patient telephone appointment on 07/26/2020 for CCM at 11:00 AM with Junius Argyle the Clinical pharmacist.   Patient Verbalized understanding  [place] Pharmacist Assistant [phone]

## 2020-07-26 ENCOUNTER — Ambulatory Visit: Payer: Medicare Other

## 2020-07-26 NOTE — Chronic Care Management (AMB) (Signed)
Chronic Care Management Pharmacy  Name: Gae Bihl  MRN: [id number] DOB: [birth year]    Chief Complaint/ HPI  Brenda Patterson,  79 y.o. , female presents for their Follow-Up CCM visit with the clinical pharmacist via telephone.   Patient is a retired Psychologist, sport and exercise for ENT clinic and living together with her husband. They have been married for 59 years and they are blessed with 3 children. One of her daughters passed away 07/01/2019 due to a rare thyroid cancer at age 94.They have 1 grandson and 2 great grand children. She loves to plant flowers and her husband gardens. 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   PCP : [name], MD  Their chronic conditions include: HTM, HLD, type 2 diabetes, elevated TSH, CKD  Office Visits: 07/20/20: Patient presented to Ofilia Neas, LPN for AWV.  04/08/86: Patient presented to Dr. Elease Hashimoto. Metformin stopped. A1c worsened to 7.5%. Glipizide increased to twice daily, although patient reported not taking glipizide or synthroid in months due to daughter's death.  06/24/20: Video visit with Dr. Elease Hashimoto for sinusitis. Cefuroxime stopped (completed course). Patient started on doxycycline 10 mg twice daily.  04/20/20: Pharmacist Visit.   Consult Visit: None noted in past 6 months.   Medications: Outpatient Encounter Medications as of 07/26/2020  Medication Sig   albuterol (PROVENTIL HFA;VENTOLIN HFA) 108 (90 Base) MCG/ACT inhaler Inhale 2 puffs into the lungs every 6 (six) hours as needed for wheezing or shortness of breath.   Ascorbic Acid (VITAMIN C) 1000 MG tablet Take 1,000 mg by mouth daily.   aspirin EC 81 MG tablet Take 81 mg by mouth daily.     Calcium Carbonate (CALCIUM 600 PO) Take 600 mg by mouth 2 (two) times daily.    carvedilol (COREG) 6.25 MG tablet TAKE 1 TABLET BY MOUTH  TWICE DAILY   cholecalciferol (VITAMIN D) 1000 UNITS  tablet Take 5,000 Units by mouth daily.    fluticasone (FLONASE) 50 MCG/ACT nasal spray Place 2 sprays into both nostrils daily.   glipiZIDE (GLUCOTROL XL) 2.5 MG 24 hr tablet TAKE 1 TABLET BY MOUTH  DAILY WITH BREAKFAST   hydrochlorothiazide (HYDRODIURIL) 25 MG tablet TAKE 1 TABLET BY MOUTH  DAILY   levothyroxine (SYNTHROID) 25 MCG tablet Take 1 tablet by mouth once daily with breakfast   lisinopril (ZESTRIL) 20 MG tablet Take 0.5 tablets (10 mg total) by mouth daily.   lovastatin (MEVACOR) 40 MG tablet TAKE 1 TABLET BY MOUTH AT  BEDTIME. PLEASE SCHEDULE  APPOINTMENT WITH LABS FOR  FURTHER REFILLS ON THIS  MEDICATION. [phone]   Multiple Vitamin (MULTIVITAMIN) tablet Take 1 tablet by mouth daily.     NIFEdipine (PROCARDIA-XL/NIFEDICAL-XL) 30 MG 24 hr tablet TAKE 1 TABLET BY MOUTH  TWICE DAILY   sertraline (ZOLOFT) 50 MG tablet TAKE 1 TABLET BY MOUTH  DAILY   No facility-administered encounter medications on file as of 07/26/2020.   SDOH Interventions     Most Recent Value  SDOH Interventions  Financial Strain Interventions Intervention Not Indicated  Transportation Interventions Intervention Not Indicated     Current Diagnosis/Assessment:  Goals Addressed            This Visit's Progress    Chronic Care Management       CARE PLAN ENTRY  Current Barriers:   Chronic Disease Management support, education, and care coordination needs related to Hypertension, Hyperlipidemia, and Diabetes   Hypertension  Pharmacist Clinical Goal(s): o Over the next 90 days, patient will work with PharmD and providers to maintain BP goal <140/90  Current regimen:   Carvedilol 6.25 mg 1 tablet twice a day  HCTZ 25 mg 1 tablet once daily  Lisinopril 20 mg 0.5 tablet once daily  Nifedipine 30 mg 1 tablet twice daily  Interventions: o Discussed diet and exercise extensively o Discussed the importance of routinely checking her blood pressure  Patient self care activities - Over  the next 90 days, patient will: o Check BP once a week, document, and provide at future appointments o Ensure daily salt intake < 2300 mg/day  Hyperlipidemia  Pharmacist Clinical Goal(s): o Over the next 90 days, patient will work with PharmD and providers to maintain LDL goal < 100  Current regimen:  o Lovastatin 40 mg 1 tablet daily at bedtime  Interventions: o Discussed diet and exercise extensively  Patient self care activities - Over the next 90 days, patient will: o Continue eating heart healthy diet low on cholesterol  Diabetes  Pharmacist Clinical Goal(s): o Over the next 90 days, patient will work with PharmD and providers to maintain A1c goal <7%  Current regimen:  o Glipizide 2.5 mg 1 tablet daily with breakfast  Interventions: o Discussed diet and exercise extensively  Patient self care activities - Over the next 90 days, patient will: o Check blood sugar once daily, document, and provide at future appointments o Contact provider with any episodes of hypoglycemia  Medication management  Pharmacist Clinical Goal(s): o Over the next 90 days, patient will work with PharmD and providers to maintain optimal medication adherence  Current [facility]  Interventions o Comprehensive medication review performed. o Continue current medication management strategy  Patient self care activities - Over the next 90 days, patient will: o Focus on medication adherence by pill organizer o Take medications as prescribed o Report any questions or concerns to PharmD and/or provider(s)  Initial goal documentation        Hypertension   Office blood pressures are  BP Readings from Last 3 Encounters:  07/13/20 140/60  04/15/20 (!) 170/78  11/22/18 132/84   Kidney Function Lab Results  Component Value Date/Time   CREATININE 2.55 (H) 07/13/2020 11:42 AM   CREATININE 2.22 (H) 06/16/2019 08:05 AM   CREATININE 2.30 (H) 05/27/2019 08:22 AM   GFR 21.40 (L)  06/16/2019 08:05 AM   GFRNONAA 30 (L) 11/05/2017 03:06 PM   GFRAA 34 (L) 11/05/2017 03:06 PM   K 4.4 07/13/2020 11:42 AM   K 4.0 06/16/2019 08:05 AM   Patient has failed these meds in the past: felodipine Patient is currently controlled on the following medications:   Carvedilol 6.25 mg 1 tablet twice a day  HCTZ 25 mg 0.5 tablet once daily   Lisinopril 20 mg 0.5 tablet once daily  Nifedipine 30 mg 1 tablet twice daily  Patient checks BP at home infrequently  BP stable. Patiey reports losing weight from 256 lbs to 192 lbs in 4 years and she feels really good about it. She mentions just talking HCTZ 25 mg 0.5 tablet a day instead of 1 tablet. Denies checking blood pressure routinely. Loves to eat fruits and vegetables. She mentions eating meat rarely. Denies having routine exercise. Advised to continue eating heart healthy diet and low sodium intake.   Plan  Continue current medications    Hyperlipidemia   Lipid Panel     Component Value Date/Time   CHOL 193 07/13/2020  1142   TRIG 274 (H) 07/13/2020 1142   HDL 38 (L) 07/13/2020 1142   CHOLHDL 5.1 (H) 07/13/2020 1142   VLDL 51.0 (H) 11/04/2018 0935   LDLCALC 116 (H) 07/13/2020 1142   LDLDIRECT 111.0 11/04/2018 0935     The ASCVD Risk score (Goff DC Jr., et al., 2013) failed to calculate for the following reasons:   The systolic blood pressure is missing   Patient has failed these meds in past: Atorvastatin Patient is currently controlled on the following medications:   Lovastatin 40 mg 1 tablet daily at bedtime  We discussed diet and exercise extensively. She eats eggs, cereal, toast for breakfast, peanut butter banana sandwich for lunch, baked potatoes, salad, and yogurt for dinner. She loves fruits and vegetables and she is not a big meat eater. No complaints with taking statin.   Plan  Continue current medications and diet low in cholesterol   Diabetes   Recent Relevant Labs: Lab Results  Component Value  Date/Time   HGBA1C 7.5 (H) 07/13/2020 11:42 AM   HGBA1C 6.9 (H) 05/27/2019 08:23 AM   GFR 21.40 (L) 06/16/2019 08:05 AM   GFR 20.55 (L) 05/27/2019 08:22 AM     Checking BG: Daily  Patient has failed these meds in past: metformin Patient is currently controlled on the following medications:   Glipizide 2.5 mg 1 tablet daily with breakfast  Last diabetic Eye exam:  Lab Results  Component Value Date/Time   HMDIABEYEEXA No Retinopathy 12/25/2017 12:00 AM    Last diabetic Foot exam:  Lab Results  Component Value Date/Time   HMDIABFOOTEX  04/14/2015 12:00 AM    Small abrasion right great toe. Otherwise normal exam     A1C within goal < 7%. Patient lost weight which has a huge impact with her blood sugar and blood pressure. Denies eating carbohydrates. Patient has no routine exercise established, but controls her sugar with diet.  Plan  Continue current medications and diet low in carbohydrates   Elevated TSH   Lab Results  Component Value Date/Time   TSH 5.12 (H) 07/13/2020 11:42 AM   TSH 4.25 05/27/2019 08:22 AM   TSH 4.87 (H) 11/22/2018 11:20 AM    Patient has failed these meds in past: none Patient is currently controlled on the following medications:   Levothyroxine 25 mg 1 capsule daily before breakfast  TSH stable. Will require new lab to reassess. Patient denies symptoms of fatigue or lethargy.  Plan  Continue current medications   Acute Recurrent Pansinusitis   Patient has failed these meds in past: None Patient is currently controlled on the following medications:   Albuterol HFA 2 puffs into the lungs every 6 hrs as need for wheezing or shortness of breath  Doxycycline 100 mg 1 capsule twice daily  Fluticasone 50 mg/act 2 sprays into both nostrils daily  Not using albuterol as much. Uses flonase when allergy starts, but has not been using that for the past days. Will finish her 7 day therapy of doxycycline soon.   Plan  Continue current  medications   Depression   Patient has failed these meds in past: None   Patient is currently controlled on the following medications:   Sertraline 50 mg 1 tablet daily  Patient reports enjoying her time with her great grand kids and that is her interest as of now. She goes out and tries to keep herself busy.  Plan  Continue current medications    OTCs/Health Maintenance   Patient is currently controlled  on the following medications:  Multivitamin 1 tablet daily  Vitamin C 1000 mg 1 tablet once daily  Aspirin EC 81 mg 1 tablet daily  Vitamin D 5000 units 1 tablet daily  Calcium carbonate 600 mg 1 tablet twice daily  Vitamin B 500 mg 1 tablet daily  Fish Oil 1000 mg 1 tablet daily  Plan  Continue current medications    Vaccines   Reviewed and discussed patient's vaccination history.    Immunization History  Administered Date(s) Administered   Fluad Quad(high Dose 65+) 07/13/2020   Influenza Split 09/01/2011, 08/11/2013   Influenza Whole 08/06/2009   Influenza, High Dose Seasonal PF 10/26/2015, 10/23/2016, 10/22/2017, 11/04/2018   Influenza,inj,Quad PF,6+ Mos 10/21/2014   Pneumococcal Conjugate-13 10/21/2014   Pneumococcal Polysaccharide-23 02/25/2009   Td 11/06/2002    Plan  Recommended patient receive shingrix vaccine in pharmacy   Medication Management   Pharmacy/Benefits: [facility] Patient has issues sometimes with [facility], but does not have to pay with anything due to having no copay with most of her medications.  Plan  Continue current medication management strategy   Follow up: 12 month phone visit  [facility] [place]

## 2020-07-29 NOTE — Patient Instructions (Signed)
Visit Information It was great speaking with you today!  Please let me know if you have any questions about our visit. Goals Addressed            This Visit's Progress   . Chronic Care Management       CARE PLAN ENTRY  Current Barriers:  . Chronic Disease Management support, education, and care coordination needs related to Hypertension, Hyperlipidemia, and Diabetes   Hypertension . Pharmacist Clinical Goal(s): o Over the next 90 days, patient will work with PharmD and providers to maintain BP goal <140/90 . Current regimen:  . Carvedilol 6.25 mg 1 tablet twice a day . HCTZ 25 mg 1 tablet once daily . Lisinopril 20 mg 0.5 tablet once daily . Nifedipine 30 mg 1 tablet twice daily . Interventions: o Discussed diet and exercise extensively o Discussed the importance of routinely checking her blood pressure . Patient self care activities - Over the next 90 days, patient will: o Check BP once a week, document, and provide at future appointments o Ensure daily salt intake < 2300 mg/day  Hyperlipidemia . Pharmacist Clinical Goal(s): o Over the next 90 days, patient will work with PharmD and providers to maintain LDL goal < 100 . Current regimen:  o Lovastatin 40 mg 1 tablet daily at bedtime . Interventions: o Discussed diet and exercise extensively . Patient self care activities - Over the next 90 days, patient will: o Continue eating heart healthy diet low on cholesterol  Diabetes . Pharmacist Clinical Goal(s): o Over the next 90 days, patient will work with PharmD and providers to maintain A1c goal <7% . Current regimen:  o Glipizide 2.5 mg 1 tablet daily with breakfast . Interventions: o Discussed diet and exercise extensively . Patient self care activities - Over the next 90 days, patient will: o Check blood sugar once daily, document, and provide at future appointments o Contact provider with any episodes of hypoglycemia  Medication management . Pharmacist Clinical  Goal(s): o Over the next 90 days, patient will work with PharmD and providers to maintain optimal medication adherence . Current [facility] . Interventions o Comprehensive medication review performed. o Continue current medication management strategy . Patient self care activities - Over the next 90 days, patient will: o Focus on medication adherence by pill organizer o Take medications as prescribed o Report any questions or concerns to PharmD and/or provider(s)  Initial goal documentation        The patient verbalized understanding of instructions provided today and agreed to receive a mailed copy of patient instruction and/or educational materials.  Telephone follow up appointment with pharmacy team member scheduled for: 07/27/21 at 11:00 AM  [facility] [place]

## 2020-08-10 ENCOUNTER — Ambulatory Visit (INDEPENDENT_AMBULATORY_CARE_PROVIDER_SITE_OTHER): Payer: Medicare Other | Admitting: Family Medicine

## 2020-08-10 ENCOUNTER — Other Ambulatory Visit: Payer: Self-pay

## 2020-08-10 ENCOUNTER — Encounter: Payer: Self-pay | Admitting: Family Medicine

## 2020-08-10 VITALS — BP 160/82 | HR 76 | Temp 97.8°F | Ht 62.5 in | Wt 193.4 lb

## 2020-08-10 DIAGNOSIS — R062 Wheezing: Secondary | ICD-10-CM | POA: Diagnosis not present

## 2020-08-10 DIAGNOSIS — I10 Essential (primary) hypertension: Secondary | ICD-10-CM

## 2020-08-10 DIAGNOSIS — N183 Chronic kidney disease, stage 3 unspecified: Secondary | ICD-10-CM

## 2020-08-10 DIAGNOSIS — R7989 Other specified abnormal findings of blood chemistry: Secondary | ICD-10-CM | POA: Diagnosis not present

## 2020-08-10 DIAGNOSIS — E1122 Type 2 diabetes mellitus with diabetic chronic kidney disease: Secondary | ICD-10-CM

## 2020-08-10 DIAGNOSIS — E785 Hyperlipidemia, unspecified: Secondary | ICD-10-CM

## 2020-08-10 MED ORDER — ROSUVASTATIN CALCIUM 20 MG PO TABS: 20.0000 mg | ORAL_TABLET | Freq: Every day | ORAL | 3 refills | Status: AC

## 2020-08-10 MED ORDER — ALBUTEROL SULFATE HFA 108 (90 BASE) MCG/ACT IN AERS: 2.0000 | INHALATION_SPRAY | Freq: Four times a day (QID) | RESPIRATORY_TRACT | 3 refills | Status: AC | PRN

## 2020-08-10 NOTE — Progress Notes (Signed)
Established Patient Office Visit  Subjective:  Patient ID: Brenda Patterson, female    DOB: 1941/03/17  Age: 79 y.o. MRN: [id number]  CC: No chief complaint on file.   HPI Brenda Patterson presents for follow-up regarding several items.  She has chronic problems including type 2 diabetes, chronic kidney disease, dyslipidemia, hypothyroidism, history of recurrent depression.  Type 2 diabetes.  Last A1c 7.5%.  Her daughter died during the past year and she states she went for several weeks and perhaps months not taking her medications at all.  She did recently start back glipizide.  We had recently discontinued Metformin because of her chronic kidney disease.  She is not monitoring sugars regularly at home.  No polyuria or polydipsia currently.  Elevated TSH.  Mildly elevated.  She has been on low-dose levothyroxine 25 mcg daily but not consistently taking over the past few months.  History of hypertension.  Has been well controlled recently.  She states she did not take any of her medications today and that may account for her elevation today  Hyperlipidemia.  Recent LDL cholesterol 116.  She has been on Mevacor for several years.  Chronic kidney disease.  Gradually worsening.  Recent creatinine 2.55.  Patient states she has seen nephrologist in the past but this was likely years ago.  DATE                BUN/Cr  07/13/2020            31/2.55 06/16/2019          39/2.22 11/22/2018          31/1.91 11/04/2018        41/2.06 11/05/2017        29/1.63 10/23/2016        26/1.56 05/01/2016          28/1.53 10/26/2015        33/1.42 12/26/2014          18/1.21  Past Medical History:  Diagnosis Date  . Allergy   . Arthritis   . 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 Diabetes mellitus   . Hyperlipidemia   . Hypertension   . Migraines     Past Surgical History:  Procedure Laterality Date  . CHOLECYSTECTOMY    . TUBAL LIGATION      Family History    Problem Relation Age of Onset  . Arthritis Other        fhx  . Hyperlipidemia Other        fhx  . Hypertension Other        fhx  . Diabetes Other        fhx  . Stroke Other        fhx  . Breast cancer Sister     Social History   Socioeconomic History  . Marital status: Married    Spouse name: Not on file  . Number of children: Not on file  . Years of education: Not on file  . Highest education level: Not on file  Occupational History  . Not on file  Tobacco Use  . Smoking status: Never Smoker  . Smokeless tobacco: Never Used  Vaping Use  . Vaping Use: Never used  Substance and Sexual Activity  . Alcohol use: No  . Drug use: No  . Sexual activity: Yes  Other Topics Concern  . 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 Difficulty of Paying Living Expenses: Not hard at all  Food Insecurity: No Food Insecurity  . Worried About Charity fundraiser in the Last Year: Never true  . Ran Out of Food in the Last Year: Never true  Transportation Needs: No Transportation Needs  . Lack of Transportation (Medical): No  . Lack of Transportation (Non-Medical): No  Physical Activity: Inactive  . Days of Exercise per Week: 0 days  . Minutes of Exercise per Session: 0 min  Stress: No Stress Concern Present  . Feeling of Stress : Not at all  Social Connections: Moderately Isolated  . Frequency of Communication with Friends and Family: More than three times a week  . Frequency of Social Gatherings with Friends and Family: More than three times a week  . Attends Religious Services: Never  . Active Member of Clubs or Organizations: No  . Attends Archivist Meetings: Never  . Marital Status: Married  Human resources officer Violence: Not At Risk  . Fear of Current or Ex-Partner: No  . Emotionally Abused: No  . Physically Abused: No  . Sexually Abused: No     Outpatient Medications Prior to Visit  Medication Sig Dispense Refill  . Ascorbic Acid (VITAMIN C) 1000 MG tablet Take 1,000 mg by mouth daily.    Marland Kitchen aspirin EC 81 MG tablet Take 81 mg by mouth daily.      . Calcium Carbonate (CALCIUM 600 PO) Take 600 mg by mouth 2 (two) times daily.     . carvedilol (COREG) 6.25 MG tablet TAKE 1 TABLET BY MOUTH  TWICE DAILY 180 tablet 3  . cholecalciferol (VITAMIN D) 1000 UNITS tablet Take 5,000 Units by mouth daily.     . fluticasone (FLONASE) 50 MCG/ACT nasal spray Place 2 sprays into both nostrils daily. 16 g 2  . glipiZIDE (GLUCOTROL XL) 2.5 MG 24 hr tablet TAKE 1 TABLET BY MOUTH  DAILY WITH BREAKFAST 90 tablet 3  . hydrochlorothiazide (HYDRODIURIL) 25 MG tablet TAKE 1 TABLET BY MOUTH  DAILY 90 tablet 3  . levothyroxine (SYNTHROID) 25 MCG tablet Take 1 tablet by mouth once daily with breakfast 90 tablet 1  . lisinopril (ZESTRIL) 20 MG tablet Take 0.5 tablets (10 mg total) by mouth daily. 90 tablet 0  . Multiple Vitamin (MULTIVITAMIN) tablet Take 1 tablet by mouth daily.      Marland Kitchen NIFEdipine (PROCARDIA-XL/NIFEDICAL-XL) 30 MG 24 hr tablet TAKE 1 TABLET BY MOUTH  TWICE DAILY 180 tablet 3  . sertraline (ZOLOFT) 50 MG tablet TAKE 1 TABLET BY MOUTH  DAILY 90 tablet 1  . lovastatin (MEVACOR) 40 MG tablet TAKE 1 TABLET BY MOUTH AT  BEDTIME. PLEASE SCHEDULE  APPOINTMENT WITH LABS FOR  FURTHER REFILLS ON THIS  MEDICATION. [phone] 90 tablet 3  . albuterol (PROVENTIL HFA;VENTOLIN HFA) 108 (90 Base) MCG/ACT inhaler Inhale 2 puffs into the lungs every 6 (six) hours as needed for wheezing or shortness of breath. 1 Inhaler 0   No facility-administered medications prior to visit.    Allergies  Allergen Reactions  . Azithromycin Nausea Only  . Heparin     Patient states it makes her weak  . Penicillins Rash    Rash noted inside her mouth and lips    ROS Review of Systems  Constitutional: Negative for fatigue and unexpected weight change.  Eyes: Negative  for visual disturbance.  Respiratory: Negative for cough, chest tightness, shortness of breath and wheezing.   Cardiovascular: Negative for chest pain, palpitations and leg swelling.  Endocrine: Negative for polydipsia and polyuria.  Neurological: Negative for dizziness, seizures, syncope, weakness, light-headedness and headaches.      Objective:    Physical Exam Constitutional:      Appearance: She is well-developed.  Eyes:     Pupils: Pupils are equal, round, and reactive to light.  Neck:     Thyroid: No thyromegaly.     Vascular: No JVD.  Cardiovascular:     Rate and Rhythm: Normal rate and regular rhythm.     Heart sounds: No gallop.   Pulmonary:     Effort: Pulmonary effort is normal. No respiratory distress.     Breath sounds: Normal breath sounds. No wheezing or rales.  Musculoskeletal:     Cervical back: Neck supple.     Right lower leg: No edema.     Left lower leg: No edema.  Neurological:     General: No focal deficit present.     Mental Status: She is alert.     BP (!) 160/82 (BP Location: Left Arm, Patient Position: Sitting, Cuff Size: Normal)   Pulse 76   Temp 97.8 F (36.6 C) (Oral)   Ht 5' 2.5" (1.588 m)   Wt 193 lb 7 oz (87.7 kg)   SpO2 98%   BMI 34.82 kg/m  Wt Readings from Last 3 Encounters:  08/10/20 193 lb 7 oz (87.7 kg)  07/13/20 192 lb (87.1 kg)  11/22/18 186 lb 8 oz (84.6 kg)     Health Maintenance Due  Topic Date Due  . COVID-19 Vaccine (1) Never done    There are no preventive care reminders to display for this patient.  Lab Results  Component Value Date   TSH 5.12 (H) 07/13/2020   Lab Results  Component Value Date   WBC 6.5 07/13/2020   HGB 10.4 (L) 07/13/2020   HCT 31.3 (L) 07/13/2020   MCV 92.3 07/13/2020   PLT 221 07/13/2020   Lab Results  Component Value Date   NA 136 07/13/2020   K 4.4 07/13/2020   CO2 21 07/13/2020   GLUCOSE 214 (H) 07/13/2020   BUN 31 (H) 07/13/2020   CREATININE 2.55 (H) 07/13/2020    BILITOT 0.5 07/13/2020   ALKPHOS 73 11/04/2018   AST 16 07/13/2020   ALT 11 07/13/2020   PROT 6.1 07/13/2020   ALBUMIN 3.7 11/04/2018   CALCIUM 8.8 07/13/2020   ANIONGAP 12 11/05/2017   GFR 21.40 (L) 06/16/2019   Lab Results  Component Value Date   CHOL 193 07/13/2020   Lab Results  Component Value Date   HDL 38 (L) 07/13/2020   Lab Results  Component Value Date   LDLCALC 116 (H) 07/13/2020   Lab Results  Component Value Date   TRIG 274 (H) 07/13/2020   Lab Results  Component Value Date   CHOLHDL 5.1 (H) 07/13/2020   Lab Results  Component Value Date   HGBA1C 7.5 (H) 07/13/2020      Assessment & Plan:   Problem List Items Addressed This Visit      Unprioritized   Elevated TSH   CKD (chronic kidney disease) stage 3, GFR 30-59 ml/min (HCC)   Hyperlipidemia   Relevant Medications   rosuvastatin (CRESTOR) 20 MG tablet   Type 2 diabetes mellitus, controlled ([place])   Relevant Medications   rosuvastatin (CRESTOR) 20 MG tablet   Essential hypertension -  Primary   Relevant Medications   rosuvastatin (CRESTOR) 20 MG tablet    Other Visit Diagnoses    Symptom of wheezing       Relevant Medications   albuterol (VENTOLIN HFA) 108 (90 Base) MCG/ACT inhaler    -Patient will get back on glipizide and we plan to recheck in 3 months.  She is not a candidate for SGLT2 medication because of her chronic kidney disease.  If not to goal at 47-month follow-up consider additional medication  Get back on regular use of levothyroxine.  Recheck TSH in 3 months.  Adjust accordingly if not to goal that point  We discussed tighter control of her lipids.  Recent LDL cholesterol 116.  Discontinue Mevacor and start Crestor 20 mg daily and recheck lipid and hepatic at follow-up  Chronic kidney disease -discontinued Metformin recently.  Set up nephrology referral.  Set up renal ultrasound.  Avoid nonsteroidals.  Meds ordered this encounter  Medications  . albuterol (VENTOLIN HFA) 108  (90 Base) MCG/ACT inhaler    Sig: Inhale 2 puffs into the lungs every 6 (six) hours as needed for wheezing or shortness of breath.    Dispense:  18 g    Refill:  3  . rosuvastatin (CRESTOR) 20 MG tablet    Sig: Take 1 tablet (20 mg total) by mouth daily.    Dispense:  90 tablet    Refill:  3    Follow-up: Return in about 3 months (around 11/10/2020).    Carolann Littler, MD

## 2020-08-10 NOTE — Patient Instructions (Signed)
Stop the Mevacor  Start Rosuvastatin (Crestor) one daily  I will set up renal ultrasound and nephrology referral      Set up 3 month follow up.

## 2020-08-18 ENCOUNTER — Other Ambulatory Visit: Payer: Self-pay | Admitting: Family Medicine

## 2020-08-23 ENCOUNTER — Ambulatory Visit
Admission: RE | Admit: 2020-08-23 | Discharge: 2020-08-23 | Disposition: A | Discharge status: Home / Self Care | Payer: Medicare Other | Source: Ambulatory Visit | Attending: Family Medicine | Admitting: Family Medicine

## 2020-08-23 DIAGNOSIS — N281 Cyst of kidney, acquired: Secondary | ICD-10-CM | POA: Diagnosis not present

## 2020-08-23 DIAGNOSIS — N183 Chronic kidney disease, stage 3 unspecified: Secondary | ICD-10-CM

## 2020-08-23 DIAGNOSIS — N189 Chronic kidney disease, unspecified: Secondary | ICD-10-CM | POA: Diagnosis not present

## 2020-09-29 ENCOUNTER — Other Ambulatory Visit: Payer: Self-pay | Admitting: Family Medicine

## 2020-10-14 DIAGNOSIS — D631 Anemia in chronic kidney disease: Secondary | ICD-10-CM | POA: Diagnosis not present

## 2020-10-14 DIAGNOSIS — R809 Proteinuria, unspecified: Secondary | ICD-10-CM | POA: Diagnosis not present

## 2020-10-14 DIAGNOSIS — E7800 Pure hypercholesterolemia, unspecified: Secondary | ICD-10-CM | POA: Diagnosis not present

## 2020-10-14 DIAGNOSIS — N184 Chronic kidney disease, stage 4 (severe): Secondary | ICD-10-CM | POA: Diagnosis not present

## 2020-10-14 DIAGNOSIS — I129 Hypertensive chronic kidney disease with stage 1 through stage 4 chronic kidney disease, or unspecified chronic kidney disease: Secondary | ICD-10-CM | POA: Diagnosis not present

## 2020-10-14 DIAGNOSIS — N1832 Chronic kidney disease, stage 3b: Secondary | ICD-10-CM | POA: Diagnosis not present

## 2020-11-09 ENCOUNTER — Other Ambulatory Visit: Payer: Self-pay

## 2020-11-09 ENCOUNTER — Telehealth (INDEPENDENT_AMBULATORY_CARE_PROVIDER_SITE_OTHER): Payer: Medicare Other | Admitting: Family Medicine

## 2020-11-09 DIAGNOSIS — R112 Nausea with vomiting, unspecified: Secondary | ICD-10-CM | POA: Diagnosis not present

## 2020-11-09 DIAGNOSIS — R197 Diarrhea, unspecified: Secondary | ICD-10-CM | POA: Diagnosis not present

## 2020-11-09 MED ORDER — ONDANSETRON HCL 4 MG PO TABS: 4.0000 mg | ORAL_TABLET | Freq: Three times a day (TID) | ORAL | 0 refills | Status: AC | PRN

## 2020-11-09 NOTE — Progress Notes (Signed)
Virtual Visit via Telephone Note  I connected with Brenda Patterson on 11/09/20 at  3:00 PM EST by telephone and verified that I am speaking with the correct person using two identifiers.   I discussed the limitations, risks, security and privacy concerns of performing an evaluation and management service by telephone and the availability of in person appointments. I also discussed with the patient that there may be a patient responsible charge related to this service. The patient expressed understanding and agreed to proceed.  Location patient: home, [place] Location provider: work or home office Participants present for the call: patient, provider, patient's husband Patient did not have a visit with me in the prior 7 days to address this/these issue(s).   History of Present Illness:  Acute telemedicine visit for nausea and vomiting: -Onset: 3 days ago -Symptoms include: initially nausea and vomiting, feeling tired, also had emesis once overnight, diarrhea (4 episodes of watery diarrhea yesterday) -doing much better today - no emesis today, one episode of diarrhea -is drinking some -Denies: fevers, abd pain today, resp symptoms, body aches, known sick contacts but has been around others for the holiday -Pertinent past medical history:see chart -Pertinent medication allergies:see chart -COVID-19 vaccine status: not vaccinated, did get flu shot   Observations/Objective: Patient sounds cheerful and well on the phone. I do not appreciate any SOB. Speech and thought processing are grossly intact. Patient reported vitals:  Assessment and Plan:  Nausea and vomiting, intractability of vomiting not specified, unspecified vomiting type  Diarrhea, unspecified type  -we discussed possible serious and likely etiologies, options for evaluation and workup, limitations of telemedicine visit vs in person visit, treatment, treatment risks and precautions. Pt prefers to treat via telemedicine  empirically rather than in person at this moment.  Likely viral gastroenteritis this given the timing of her symptoms and she is starting to improve.  Discussed other potential etiologies as well.  She wanted something for nausea.  Sent Rx for Zofran 4 mg to use every 8 hours as needed.  Also discussed she could try Imodium if any further diarrhea.  Advised oral rehydration, bland diet and no dairy for 1 week.  Did discuss possibility of Covid options for testing, treatment, potential complications and precautions.  Advised if a positive test that she follow-up promptly for video visit.  Advised to seek prompt in person care if worsening, new symptoms arise, or if is not improving with treatment. Advised of options for inperson care in case PCP office not available. Did let the patient know that I only do telemedicine shifts for [place] on Tuesdays and Thursdays and advised a follow up visit with PCP or at an [facility] if has further questions or concerns.   Follow Up Instructions:  I did not refer this patient for an OV with me in the next 24 hours for this/these issue(s).  I discussed the assessment and treatment plan with the patient. The patient was provided an opportunity to ask questions and all were answered. The patient agreed with the plan and demonstrated an understanding of the instructions.   I spent 12 minutes on the date of this visit in the care of this patient. See summary of tasks completed to properly care for this patient in the detailed notes above which often include previsit review of recent office visit notes, review of PMH, medications, allergies, evaluation of the patient and ordering and instructing patient on testing and care options.     Lucretia Kern, DO

## 2020-11-10 ENCOUNTER — Ambulatory Visit: Payer: Medicare Other | Admitting: Family Medicine

## 2020-11-10 DIAGNOSIS — Z0289 Encounter for other administrative examinations: Secondary | ICD-10-CM

## 2020-11-11 ENCOUNTER — Telehealth: Payer: Self-pay | Admitting: Family Medicine

## 2020-11-11 NOTE — Telephone Encounter (Signed)
Patient called and stated that her mother just tested positive for covid and wanted to see if she needs to be on the infusion list, please advise. CB is [phone]

## 2020-11-11 NOTE — Telephone Encounter (Signed)
Left message for patient that virtual visit is scheduled.

## 2020-11-12 ENCOUNTER — Telehealth (INDEPENDENT_AMBULATORY_CARE_PROVIDER_SITE_OTHER): Payer: Medicare Other | Admitting: Family Medicine

## 2020-11-12 ENCOUNTER — Other Ambulatory Visit: Payer: Self-pay

## 2020-11-12 ENCOUNTER — Emergency Department (HOSPITAL_COMMUNITY)
Admission: EM | Admit: 2020-11-12 | Discharge: 2020-11-13 | Disposition: A | Discharge status: Home / Self Care | Payer: Medicare Other | Source: Home / Self Care | Attending: Emergency Medicine | Admitting: Emergency Medicine

## 2020-11-12 ENCOUNTER — Encounter (HOSPITAL_COMMUNITY): Payer: Self-pay

## 2020-11-12 DIAGNOSIS — R110 Nausea: Secondary | ICD-10-CM | POA: Diagnosis not present

## 2020-11-12 DIAGNOSIS — I517 Cardiomegaly: Secondary | ICD-10-CM | POA: Diagnosis not present

## 2020-11-12 DIAGNOSIS — E871 Hypo-osmolality and hyponatremia: Secondary | ICD-10-CM | POA: Diagnosis not present

## 2020-11-12 DIAGNOSIS — I129 Hypertensive chronic kidney disease with stage 1 through stage 4 chronic kidney disease, or unspecified chronic kidney disease: Secondary | ICD-10-CM | POA: Insufficient documentation

## 2020-11-12 DIAGNOSIS — E1122 Type 2 diabetes mellitus with diabetic chronic kidney disease: Secondary | ICD-10-CM | POA: Insufficient documentation

## 2020-11-12 DIAGNOSIS — R531 Weakness: Secondary | ICD-10-CM | POA: Diagnosis not present

## 2020-11-12 DIAGNOSIS — Z79899 Other long term (current) drug therapy: Secondary | ICD-10-CM | POA: Insufficient documentation

## 2020-11-12 DIAGNOSIS — I361 Nonrheumatic tricuspid (valve) insufficiency: Secondary | ICD-10-CM | POA: Diagnosis not present

## 2020-11-12 DIAGNOSIS — N179 Acute kidney failure, unspecified: Secondary | ICD-10-CM | POA: Diagnosis not present

## 2020-11-12 DIAGNOSIS — N1831 Chronic kidney disease, stage 3a: Secondary | ICD-10-CM | POA: Diagnosis not present

## 2020-11-12 DIAGNOSIS — E669 Obesity, unspecified: Secondary | ICD-10-CM | POA: Diagnosis present

## 2020-11-12 DIAGNOSIS — E872 Acidosis: Secondary | ICD-10-CM | POA: Diagnosis not present

## 2020-11-12 DIAGNOSIS — M6282 Rhabdomyolysis: Secondary | ICD-10-CM | POA: Diagnosis not present

## 2020-11-12 DIAGNOSIS — D638 Anemia in other chronic diseases classified elsewhere: Secondary | ICD-10-CM | POA: Diagnosis not present

## 2020-11-12 DIAGNOSIS — R21 Rash and other nonspecific skin eruption: Secondary | ICD-10-CM | POA: Diagnosis not present

## 2020-11-12 DIAGNOSIS — R9431 Abnormal electrocardiogram [ECG] [EKG]: Secondary | ICD-10-CM | POA: Diagnosis not present

## 2020-11-12 DIAGNOSIS — I447 Left bundle-branch block, unspecified: Secondary | ICD-10-CM | POA: Diagnosis not present

## 2020-11-12 DIAGNOSIS — J1282 Pneumonia due to coronavirus disease 2019: Secondary | ICD-10-CM | POA: Diagnosis not present

## 2020-11-12 DIAGNOSIS — R5383 Other fatigue: Secondary | ICD-10-CM | POA: Diagnosis not present

## 2020-11-12 DIAGNOSIS — E860 Dehydration: Secondary | ICD-10-CM | POA: Insufficient documentation

## 2020-11-12 DIAGNOSIS — R079 Chest pain, unspecified: Secondary | ICD-10-CM | POA: Diagnosis not present

## 2020-11-12 DIAGNOSIS — R509 Fever, unspecified: Secondary | ICD-10-CM | POA: Diagnosis not present

## 2020-11-12 DIAGNOSIS — N183 Chronic kidney disease, stage 3 unspecified: Secondary | ICD-10-CM | POA: Insufficient documentation

## 2020-11-12 DIAGNOSIS — Z880 Allergy status to penicillin: Secondary | ICD-10-CM | POA: Diagnosis not present

## 2020-11-12 DIAGNOSIS — N281 Cyst of kidney, acquired: Secondary | ICD-10-CM | POA: Diagnosis not present

## 2020-11-12 DIAGNOSIS — I340 Nonrheumatic mitral (valve) insufficiency: Secondary | ICD-10-CM | POA: Diagnosis not present

## 2020-11-12 DIAGNOSIS — Z6835 Body mass index (BMI) 35.0-35.9, adult: Secondary | ICD-10-CM | POA: Diagnosis not present

## 2020-11-12 DIAGNOSIS — N1832 Chronic kidney disease, stage 3b: Secondary | ICD-10-CM | POA: Diagnosis not present

## 2020-11-12 DIAGNOSIS — F32A Depression, unspecified: Secondary | ICD-10-CM | POA: Diagnosis not present

## 2020-11-12 DIAGNOSIS — Z7984 Long term (current) use of oral hypoglycemic drugs: Secondary | ICD-10-CM | POA: Insufficient documentation

## 2020-11-12 DIAGNOSIS — R059 Cough, unspecified: Secondary | ICD-10-CM | POA: Diagnosis not present

## 2020-11-12 DIAGNOSIS — D649 Anemia, unspecified: Secondary | ICD-10-CM | POA: Diagnosis not present

## 2020-11-12 DIAGNOSIS — Z7982 Long term (current) use of aspirin: Secondary | ICD-10-CM | POA: Insufficient documentation

## 2020-11-12 DIAGNOSIS — U071 COVID-19: Secondary | ICD-10-CM | POA: Insufficient documentation

## 2020-11-12 DIAGNOSIS — Z881 Allergy status to other antibiotic agents status: Secondary | ICD-10-CM | POA: Diagnosis not present

## 2020-11-12 DIAGNOSIS — R197 Diarrhea, unspecified: Secondary | ICD-10-CM | POA: Diagnosis not present

## 2020-11-12 DIAGNOSIS — R5381 Other malaise: Secondary | ICD-10-CM | POA: Diagnosis not present

## 2020-11-12 DIAGNOSIS — I120 Hypertensive chronic kidney disease with stage 5 chronic kidney disease or end stage renal disease: Secondary | ICD-10-CM | POA: Diagnosis not present

## 2020-11-12 DIAGNOSIS — I5043 Acute on chronic combined systolic (congestive) and diastolic (congestive) heart failure: Secondary | ICD-10-CM | POA: Diagnosis not present

## 2020-11-12 DIAGNOSIS — D631 Anemia in chronic kidney disease: Secondary | ICD-10-CM | POA: Diagnosis not present

## 2020-11-12 DIAGNOSIS — I10 Essential (primary) hypertension: Secondary | ICD-10-CM | POA: Diagnosis not present

## 2020-11-12 DIAGNOSIS — E039 Hypothyroidism, unspecified: Secondary | ICD-10-CM | POA: Diagnosis not present

## 2020-11-12 DIAGNOSIS — I519 Heart disease, unspecified: Secondary | ICD-10-CM | POA: Diagnosis not present

## 2020-11-12 DIAGNOSIS — Z888 Allergy status to other drugs, medicaments and biological substances status: Secondary | ICD-10-CM | POA: Diagnosis not present

## 2020-11-12 DIAGNOSIS — I5021 Acute systolic (congestive) heart failure: Secondary | ICD-10-CM | POA: Diagnosis not present

## 2020-11-12 DIAGNOSIS — N184 Chronic kidney disease, stage 4 (severe): Secondary | ICD-10-CM | POA: Diagnosis not present

## 2020-11-12 DIAGNOSIS — Z8249 Family history of ischemic heart disease and other diseases of the circulatory system: Secondary | ICD-10-CM | POA: Diagnosis not present

## 2020-11-12 DIAGNOSIS — Z833 Family history of diabetes mellitus: Secondary | ICD-10-CM | POA: Diagnosis not present

## 2020-11-12 DIAGNOSIS — R112 Nausea with vomiting, unspecified: Secondary | ICD-10-CM | POA: Diagnosis not present

## 2020-11-12 DIAGNOSIS — R778 Other specified abnormalities of plasma proteins: Secondary | ICD-10-CM | POA: Diagnosis not present

## 2020-11-12 DIAGNOSIS — I130 Hypertensive heart and chronic kidney disease with heart failure and stage 1 through stage 4 chronic kidney disease, or unspecified chronic kidney disease: Secondary | ICD-10-CM | POA: Diagnosis not present

## 2020-11-12 DIAGNOSIS — E785 Hyperlipidemia, unspecified: Secondary | ICD-10-CM | POA: Diagnosis not present

## 2020-11-12 LAB — COMPREHENSIVE METABOLIC PANEL
ALT: 29 U/L (ref 0–44)
AST: 41 U/L (ref 15–41)
Albumin: 3.4 g/dL — ABNORMAL LOW (ref 3.5–5.0)
Alkaline Phosphatase: 55 U/L (ref 38–126)
Anion gap: 12 (ref 5–15)
BUN: 42 mg/dL — ABNORMAL HIGH (ref 8–23)
CO2: 21 mmol/L — ABNORMAL LOW (ref 22–32)
Calcium: 8.6 mg/dL — ABNORMAL LOW (ref 8.9–10.3)
Chloride: 98 mmol/L (ref 98–111)
Creatinine, Ser: 3.09 mg/dL — ABNORMAL HIGH (ref 0.44–1.00)
GFR, Estimated: 15 mL/min — ABNORMAL LOW (ref >60)
Glucose, Bld: 167 mg/dL — ABNORMAL HIGH (ref 70–99)
Potassium: 3.6 mmol/L (ref 3.5–5.1)
Sodium: 131 mmol/L — ABNORMAL LOW (ref 135–145)
Total Bilirubin: 0.6 mg/dL (ref 0.3–1.2)
Total Protein: 6.8 g/dL (ref 6.5–8.1)

## 2020-11-12 LAB — CBC WITH DIFFERENTIAL/PLATELET
Abs Immature Granulocytes: 0.02 10*3/uL (ref 0.00–0.07)
Basophils Absolute: 0 10*3/uL (ref 0.0–0.1)
Basophils Relative: 0 %
Eosinophils Absolute: 0 10*3/uL (ref 0.0–0.5)
Eosinophils Relative: 0 %
HCT: 34.6 % — ABNORMAL LOW (ref 36.0–46.0)
Hemoglobin: 11.8 g/dL — ABNORMAL LOW (ref 12.0–15.0)
Immature Granulocytes: 0 %
Lymphocytes Relative: 30 %
Lymphs Abs: 2 10*3/uL (ref 0.7–4.0)
MCH: 31.1 pg (ref 26.0–34.0)
MCHC: 34.1 g/dL (ref 30.0–36.0)
MCV: 91.1 fL (ref 80.0–100.0)
Monocytes Absolute: 0.5 10*3/uL (ref 0.1–1.0)
Monocytes Relative: 7 %
Neutro Abs: 4.1 10*3/uL (ref 1.7–7.7)
Neutrophils Relative %: 63 %
Platelets: 154 10*3/uL (ref 150–400)
RBC: 3.8 MIL/uL — ABNORMAL LOW (ref 3.87–5.11)
RDW: 12.3 % (ref 11.5–15.5)
WBC: 6.6 10*3/uL (ref 4.0–10.5)
nRBC: 0 % (ref 0.0–0.2)

## 2020-11-12 MED ORDER — SODIUM CHLORIDE 0.9 % IV BOLUS
500.0000 mL | Freq: Once | INTRAVENOUS | Status: AC
  Administered 2020-11-12: 500 mL via INTRAVENOUS

## 2020-11-12 MED ORDER — SODIUM CHLORIDE 0.9 % IV SOLN: INTRAVENOUS | Status: DC

## 2020-11-12 MED ORDER — SODIUM CHLORIDE 0.9 % IV BOLUS
500.0000 mL | Freq: Once | INTRAVENOUS | Status: AC
  Administered 2020-11-13: 500 mL via INTRAVENOUS

## 2020-11-12 MED ORDER — LOPERAMIDE HCL 2 MG PO TABS: 2.0000 mg | ORAL_TABLET | Freq: Four times a day (QID) | ORAL | 0 refills | Status: AC | PRN

## 2020-11-12 NOTE — ED Triage Notes (Signed)
Pt reports testing positive for covid about a week ago. EMS brought pt in tonight for c/o diarrhea and nausea. Pt says the diarrhea started today. Pt has not taken anything for diarrhea.

## 2020-11-12 NOTE — Progress Notes (Signed)
Virtual Visit via Telephone Note  I connected with Brenda Patterson on 11/12/20 at  3:20 PM EST by telephone and verified that I am speaking with the correct person using two identifiers.   I discussed the limitations, risks, security and privacy concerns of performing an evaluation and management service by telephone and the availability of in person appointments. I also discussed with the patient that there may be a patient responsible charge related to this service. The patient expressed understanding and agreed to proceed.  Location patient: home, [place] Location provider: work or home office Participants present for the call: patient, provider Patient did not have a visit with me in the prior 7 days to address this/these issue(s).   History of Present Illness:  Acute telemedicine visit for : -Onset: 11/06/20; positive covid test yesterday -Symptoms include: nausea and vomiting initially (vomiting now resolved), now with some diarrhea , 2-3 episodes per day (no blood) -feeling better today and is eating and drinking -Denies: melena, hematochezia, CP, SOB, cough, inability to tolerate oral intake, inability to get out of bed -Has tried:drinking fluids -Pertinent past medical history:HTN, DM, CKD, obesity, depression -Pertinent medication allergies: penicillin, azithromycin, heparin -COVID-19 vaccine status: not vaccinated -She is requesting referral for treatment of Covid after obtaining a positive Covid test   Observations/Objective: Patient sounds cheerful and well on the phone. I do not appreciate any SOB. Speech and thought processing are grossly intact. Patient reported vitals:  Assessment and Plan:  COVID-19  -we discussed possible serious and likely etiologies, options for evaluation and workup, limitations of telemedicine visit vs in person visit, treatment, treatment risks and precautions. Pt prefers to treat via telemedicine empirically rather than in person at this  moment.  Seems she is currently having mild symptoms of COVID-19 illness.  Discussed treatment options, potential complications, isolation and precautions.  She is unvaccinated and has several risk factors for more severe disease.  She is requesting referral for treatment.  Referral sent.  Did let her know about the hospital update that treatment doses are very limited and that they will  Scheduled follow up with PCP offered: Declined, but she agrees to follow-up if needed. Advised to seek prompt in person care if worsening, new symptoms arise, or if is not improving with treatment. Advised of options for inperson care in case PCP office not available. Did let the patient know that I only do telemedicine shifts for [place] on Tuesdays and Thursdays and advised a follow up visit with PCP or at an [facility] if has further questions or concerns.   Follow Up Instructions:  I did not refer this patient for an OV with me in the next 24 hours for this/these issue(s).  I discussed the assessment and treatment plan with the patient. The patient was provided an opportunity to ask questions and all were answered. The patient agreed with the plan and demonstrated an understanding of the instructions.   I spent 17 minutes on the date of this visit in the care of this patient. See summary of tasks completed to properly care for this patient in the detailed notes above which included previsit review of recent office visit notes, review of PMH, medications, allergies, evaluation of the patient and ordering and instructing patient on testing and care options.     Lucretia Kern, DO

## 2020-11-12 NOTE — ED Provider Notes (Addendum)
[facility] EMERGENCY DEPARTMENT Provider Note   CSN: [id number] Arrival date & time: 11/12/20  2100     History Chief Complaint  Patient presents with  . Diarrhea    Pt reports + covid home test week ago    Brenda Patterson is a 80 y.o. female.  Patient with Covid symptoms since January 1.  Initially had nausea and vomiting.  Today started with diarrhea.  Patient has not been taking anything for the diarrhea.  Patient denies any shortness of breath or trouble breathing.  Oxygen saturation on room air 100%.  Temp here 100.1.  Respiratory rate 13 heart rate 77.  Blood pressure 174/69.  Patient has a history of diabetes hypertension.  Patient was evaluated by her primary care office today by telemedicine.  But was feeling fine.  But now the diarrhea is set in.  Patient states her desire to eat or drink has been minimal.  She is eating and drinking some.  Denies any abdominal pain.  Patient has a known history of chronic kidney disease stage III.        Past Medical History:  Diagnosis Date  . Allergy   . Arthritis   . 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 Diabetes mellitus   . Hyperlipidemia   . Hypertension   . Migraines     Patient Active Problem List   Diagnosis Date Noted  . Elevated TSH 02/21/2019  . Osteopenia 10/22/2017  . Acute frontal sinusitis 11/11/2015  . CKD (chronic kidney disease) stage 3, GFR 30-59 ml/min (HCC) 10/26/2015  . Anemia of chronic disease   . PNA (pneumonia) 12/26/2014  . Nausea vomiting and diarrhea 01/13/2014  . Obesity (BMI 30-39.9) 09/25/2013  . INGROWN TOENAIL 06/16/2010  . Hyperlipidemia 10/27/2009  . History of depression 10/27/2009  . ANEMIA, HX OF 03/25/2009  . Type 2 diabetes mellitus, controlled ([place]) 02/25/2009  . Essential hypertension 02/25/2009  . Allergic rhinitis 02/25/2009    Past Surgical History:  Procedure Laterality Date  . CHOLECYSTECTOMY    . TUBAL LIGATION       OB History   No  obstetric history on file.     Family History  Problem Relation Age of Onset  . Arthritis Other        fhx  . Hyperlipidemia Other        fhx  . Hypertension Other        fhx  . Diabetes Other        fhx  . Stroke Other        fhx  . Breast cancer Sister     Social History   Tobacco Use  . Smoking status: Never Smoker  . Smokeless tobacco: Never Used  Vaping Use  . Vaping Use: Never used  Substance Use Topics  . Alcohol use: No  . Drug use: No    Home Medications Prior to Admission medications   Medication Sig Start Date End Date Taking? Authorizing Provider  albuterol (VENTOLIN HFA) 108 (90 Base) MCG/ACT inhaler Inhale 2 puffs into the lungs every 6 (six) hours as needed for wheezing or shortness of breath. 08/10/20 09/09/20  Burchette, Alinda Sierras, MD  Ascorbic Acid (VITAMIN C) 1000 MG tablet Take 1,000 mg by mouth daily.    [provider]  aspirin EC 81 MG tablet Take 81 mg by mouth daily.      [provider]  Calcium Carbonate (CALCIUM 600 PO) Take 600 mg  by mouth 2 (two) times daily.     [provider]  carvedilol (COREG) 6.25 MG tablet TAKE 1 TABLET BY MOUTH  TWICE DAILY 06/23/20   Burchette, Alinda Sierras, MD  cholecalciferol (VITAMIN D) 1000 UNITS tablet Take 5,000 Units by mouth daily.     [provider]  fluticasone (FLONASE) 50 MCG/ACT nasal spray Place 2 sprays into both nostrils daily. 11/05/17   Noemi Chapel, MD  glipiZIDE (GLUCOTROL XL) 2.5 MG 24 hr tablet TAKE 1 TABLET BY MOUTH  DAILY WITH BREAKFAST 06/23/20   Burchette, Alinda Sierras, MD  hydrochlorothiazide (HYDRODIURIL) 25 MG tablet TAKE 1 TABLET BY MOUTH  DAILY 06/23/20   Burchette, Alinda Sierras, MD  levothyroxine (SYNTHROID) 25 MCG tablet TAKE 1 TABLET BY MOUTH ONCE DAILY WITH BREAKFAST 08/18/20   Burchette, Alinda Sierras, MD  lisinopril (ZESTRIL) 20 MG tablet TAKE ONE-HALF TABLET BY  MOUTH DAILY 09/29/20   Burchette, Alinda Sierras, MD  Multiple Vitamin (MULTIVITAMIN) tablet Take 1 tablet by mouth  daily.      [provider]  NIFEdipine (PROCARDIA-XL/NIFEDICAL-XL) 30 MG 24 hr tablet TAKE 1 TABLET BY MOUTH  TWICE DAILY 06/23/20   Burchette, Alinda Sierras, MD  ondansetron (ZOFRAN) 4 MG tablet Take 1 tablet (4 mg total) by mouth every 8 (eight) hours as needed for nausea or vomiting. 11/09/20   Lucretia Kern, DO  rosuvastatin (CRESTOR) 20 MG tablet Take 1 tablet (20 mg total) by mouth daily. 08/10/20   Burchette, Alinda Sierras, MD  sertraline (ZOLOFT) 50 MG tablet TAKE 1 TABLET BY MOUTH  DAILY 08/18/20   Burchette, Alinda Sierras, MD    Allergies    Azithromycin, Heparin, and Penicillins  Review of Systems   Review of Systems  Constitutional: Positive for appetite change and fatigue. Negative for chills and fever.  HENT: Negative for rhinorrhea and sore throat.   Eyes: Negative for visual disturbance.  Respiratory: Negative for cough and shortness of breath.   Cardiovascular: Negative for chest pain and leg swelling.  Gastrointestinal: Positive for diarrhea and nausea. Negative for abdominal pain and vomiting.  Genitourinary: Negative for dysuria.  Musculoskeletal: Negative for back pain and neck pain.  Skin: Negative for rash.  Neurological: Negative for dizziness, light-headedness and headaches.  Hematological: Does not bruise/bleed easily.  Psychiatric/Behavioral: Negative for confusion.    Physical Exam Updated Vital Signs BP (!) 170/69   Pulse 72   Temp 100.1 F (37.8 C) (Oral)   Resp 13   Ht 1.575 m (5\' 2" )   Wt 87.1 kg   SpO2 97%   BMI 35.12 kg/m   Physical Exam Vitals and nursing note reviewed.  Constitutional:      General: She is not in acute distress.    Appearance: Normal appearance. She is well-developed and well-nourished. She is not toxic-appearing.  HENT:     Head: Normocephalic and atraumatic.     Mouth/Throat:     Mouth: Mucous membranes are dry.  Eyes:     Extraocular Movements: Extraocular movements intact.     Conjunctiva/sclera: Conjunctivae normal.      Pupils: Pupils are equal, round, and reactive to light.  Cardiovascular:     Rate and Rhythm: Normal rate and regular rhythm.     Heart sounds: No murmur heard.   Pulmonary:     Effort: Pulmonary effort is normal. No respiratory distress.     Breath sounds: Normal breath sounds.  Abdominal:     General: There is no distension.     Palpations:  Abdomen is soft.     Tenderness: There is no abdominal tenderness.  Musculoskeletal:        General: No swelling or edema. Normal range of motion.     Cervical back: Neck supple.  Skin:    General: Skin is warm and dry.     Capillary Refill: Capillary refill takes less than 2 seconds.  Neurological:     General: No focal deficit present.     Mental Status: She is alert and oriented to person, place, and time.     Cranial Nerves: No cranial nerve deficit.     Sensory: No sensory deficit.  Psychiatric:        Mood and Affect: Mood and affect normal.     ED Results / Procedures / Treatments   Labs (all labs ordered are listed, but only abnormal results are displayed) Labs Reviewed  COMPREHENSIVE METABOLIC PANEL - Abnormal; Notable for the following components:      Result Value   Sodium 131 (*)    CO2 21 (*)    Glucose, Bld 167 (*)    BUN 42 (*)    Creatinine, Ser 3.09 (*)    Calcium 8.6 (*)    Albumin 3.4 (*)    GFR, Estimated 15 (*)    All other components within normal limits  CBC WITH DIFFERENTIAL/PLATELET - Abnormal; Notable for the following components:   RBC 3.80 (*)    Hemoglobin 11.8 (*)    HCT 34.6 (*)    All other components within normal limits    EKG None  Radiology No results found.  Procedures Procedures (including critical care time)  Medications Ordered in ED Medications  0.9 %  sodium chloride infusion ( Intravenous New Bag/Given 11/12/20 2228)  sodium chloride 0.9 % bolus 500 mL (has no administration in time range)  sodium chloride 0.9 % bolus 500 mL (0 mLs Intravenous Stopped 11/12/20 2249)    ED  Course  I have reviewed the triage vital signs and the nursing notes.  Pertinent labs & imaging results that were available during my care of the patient were reviewed by me and considered in my medical decision making (see chart for details).    MDM Rules/Calculators/A&P                          Patient's labs show reasonable blood sugar 167.  CO2 was 21.  BUN and creatinine elevated 42 and 3.09.  But in September it was 2.55.  Patient received here 1 L of fluid.  Patient's potassium was normal at 3.6.  Feel that this is due to some dehydration from the nausea and vomiting and then the onset of diarrhea today.  The has no blood in it.  No abdominal pain.  Diarrhea just started today.  Feel that we can treat her symptomatically for the diarrhea with Imodium A-D as well as close follow-up with her primary care doctor on Monday patient will return for any new or worse symptoms.  No hypoxia.  Notes from her primary care doctor show that they have submitted her name for therapeutic treatments.  Patient without any further episodes of diarrhea here.  Feel that we can probably control it.  Close follow-up with primary care doctor recheck of electrolytes.  Patient will return for any new or worse symptoms.  Final Clinical Impression(s) / ED Diagnoses Final diagnoses:  Dehydration  COVID    Rx / DC Orders ED Discharge Orders  None       Fredia Sorrow, MD 11/12/20 2352    Fredia Sorrow, MD 11/12/20 [phone]

## 2020-11-12 NOTE — Discharge Instructions (Addendum)
Take the Imodium as needed for the diarrhea.  Follow-up with your doctor for recheck on Monday.  This can be a telemedicine visit.  Return for any new or worse symptoms.  Return if you are unable to keep fluids down.  Would also recommend vitamin D and zinc supplements over-the-counter as well as a multivitamin.  Follow-up with your doctor regarding the monoclonal antibody.

## 2020-11-12 NOTE — Patient Instructions (Signed)
Please drink plenty of fluids.  Can try Imodium for the diarrhea.  I sent the referral as you requested.  I am glad you are feeling better today.  Seek follow-up video visit or in person care promptly if your symptoms worsen, new concerns arise or you are not improving with treatment.  It was nice to meet you today. I help [facility] out with telemedicine visits on Tuesdays and Thursdays and am available for visits on those days. If you have any concerns or questions following this visit please schedule a follow up visit with your Primary Care doctor or seek care at a local urgent care clinic to avoid delays in care.

## 2020-11-14 ENCOUNTER — Inpatient Hospital Stay (HOSPITAL_COMMUNITY)
Admission: EM | Admit: 2020-11-14 | Discharge: 2020-11-20 | DRG: 177 | Disposition: A | Discharge status: Home / Self Care | Payer: Medicare Other | Attending: Internal Medicine | Admitting: Internal Medicine

## 2020-11-14 ENCOUNTER — Encounter (HOSPITAL_COMMUNITY): Payer: Self-pay | Admitting: Emergency Medicine

## 2020-11-14 ENCOUNTER — Other Ambulatory Visit: Payer: Self-pay

## 2020-11-14 ENCOUNTER — Emergency Department (HOSPITAL_COMMUNITY): Payer: Medicare Other

## 2020-11-14 DIAGNOSIS — N183 Chronic kidney disease, stage 3 unspecified: Secondary | ICD-10-CM | POA: Diagnosis present

## 2020-11-14 DIAGNOSIS — D638 Anemia in other chronic diseases classified elsewhere: Secondary | ICD-10-CM | POA: Diagnosis present

## 2020-11-14 DIAGNOSIS — M6282 Rhabdomyolysis: Secondary | ICD-10-CM | POA: Diagnosis present

## 2020-11-14 DIAGNOSIS — Z888 Allergy status to other drugs, medicaments and biological substances status: Secondary | ICD-10-CM

## 2020-11-14 DIAGNOSIS — Z833 Family history of diabetes mellitus: Secondary | ICD-10-CM

## 2020-11-14 DIAGNOSIS — R112 Nausea with vomiting, unspecified: Secondary | ICD-10-CM | POA: Diagnosis present

## 2020-11-14 DIAGNOSIS — E872 Acidosis: Secondary | ICD-10-CM | POA: Diagnosis present

## 2020-11-14 DIAGNOSIS — I130 Hypertensive heart and chronic kidney disease with heart failure and stage 1 through stage 4 chronic kidney disease, or unspecified chronic kidney disease: Secondary | ICD-10-CM | POA: Diagnosis present

## 2020-11-14 DIAGNOSIS — R9431 Abnormal electrocardiogram [ECG] [EKG]: Secondary | ICD-10-CM | POA: Diagnosis not present

## 2020-11-14 DIAGNOSIS — F32A Depression, unspecified: Secondary | ICD-10-CM | POA: Diagnosis present

## 2020-11-14 DIAGNOSIS — Z7984 Long term (current) use of oral hypoglycemic drugs: Secondary | ICD-10-CM

## 2020-11-14 DIAGNOSIS — U071 COVID-19: Secondary | ICD-10-CM | POA: Diagnosis not present

## 2020-11-14 DIAGNOSIS — N179 Acute kidney failure, unspecified: Secondary | ICD-10-CM

## 2020-11-14 DIAGNOSIS — Z79899 Other long term (current) drug therapy: Secondary | ICD-10-CM

## 2020-11-14 DIAGNOSIS — E669 Obesity, unspecified: Secondary | ICD-10-CM | POA: Diagnosis present

## 2020-11-14 DIAGNOSIS — N1831 Chronic kidney disease, stage 3a: Secondary | ICD-10-CM | POA: Diagnosis present

## 2020-11-14 DIAGNOSIS — I447 Left bundle-branch block, unspecified: Secondary | ICD-10-CM

## 2020-11-14 DIAGNOSIS — I5043 Acute on chronic combined systolic (congestive) and diastolic (congestive) heart failure: Secondary | ICD-10-CM | POA: Diagnosis present

## 2020-11-14 DIAGNOSIS — I10 Essential (primary) hypertension: Secondary | ICD-10-CM | POA: Diagnosis present

## 2020-11-14 DIAGNOSIS — J1282 Pneumonia due to coronavirus disease 2019: Secondary | ICD-10-CM | POA: Diagnosis present

## 2020-11-14 DIAGNOSIS — E1122 Type 2 diabetes mellitus with diabetic chronic kidney disease: Secondary | ICD-10-CM | POA: Diagnosis present

## 2020-11-14 DIAGNOSIS — N1832 Chronic kidney disease, stage 3b: Secondary | ICD-10-CM

## 2020-11-14 DIAGNOSIS — Z7982 Long term (current) use of aspirin: Secondary | ICD-10-CM

## 2020-11-14 DIAGNOSIS — Z8249 Family history of ischemic heart disease and other diseases of the circulatory system: Secondary | ICD-10-CM

## 2020-11-14 DIAGNOSIS — Z880 Allergy status to penicillin: Secondary | ICD-10-CM

## 2020-11-14 DIAGNOSIS — E860 Dehydration: Secondary | ICD-10-CM | POA: Diagnosis present

## 2020-11-14 DIAGNOSIS — E119 Type 2 diabetes mellitus without complications: Secondary | ICD-10-CM

## 2020-11-14 DIAGNOSIS — R197 Diarrhea, unspecified: Secondary | ICD-10-CM

## 2020-11-14 DIAGNOSIS — R778 Other specified abnormalities of plasma proteins: Secondary | ICD-10-CM

## 2020-11-14 DIAGNOSIS — R7989 Other specified abnormal findings of blood chemistry: Secondary | ICD-10-CM | POA: Diagnosis present

## 2020-11-14 DIAGNOSIS — D631 Anemia in chronic kidney disease: Secondary | ICD-10-CM | POA: Diagnosis present

## 2020-11-14 DIAGNOSIS — E785 Hyperlipidemia, unspecified: Secondary | ICD-10-CM | POA: Diagnosis present

## 2020-11-14 DIAGNOSIS — E871 Hypo-osmolality and hyponatremia: Secondary | ICD-10-CM | POA: Diagnosis present

## 2020-11-14 DIAGNOSIS — Z6835 Body mass index (BMI) 35.0-35.9, adult: Secondary | ICD-10-CM

## 2020-11-14 DIAGNOSIS — Z881 Allergy status to other antibiotic agents status: Secondary | ICD-10-CM

## 2020-11-14 DIAGNOSIS — E039 Hypothyroidism, unspecified: Secondary | ICD-10-CM | POA: Diagnosis present

## 2020-11-14 DIAGNOSIS — R5383 Other fatigue: Secondary | ICD-10-CM

## 2020-11-14 DIAGNOSIS — Z79890 Hormone replacement therapy (postmenopausal): Secondary | ICD-10-CM

## 2020-11-14 LAB — CBC
HCT: 31.9 % — ABNORMAL LOW (ref 36.0–46.0)
Hemoglobin: 10.4 g/dL — ABNORMAL LOW (ref 12.0–15.0)
MCH: 29.9 pg (ref 26.0–34.0)
MCHC: 32.6 g/dL (ref 30.0–36.0)
MCV: 91.7 fL (ref 80.0–100.0)
Platelets: 123 10*3/uL — ABNORMAL LOW (ref 150–400)
RBC: 3.48 MIL/uL — ABNORMAL LOW (ref 3.87–5.11)
RDW: 12.1 % (ref 11.5–15.5)
WBC: 8.2 10*3/uL (ref 4.0–10.5)
nRBC: 0 % (ref 0.0–0.2)

## 2020-11-14 LAB — BASIC METABOLIC PANEL
Anion gap: 11 (ref 5–15)
BUN: 35 mg/dL — ABNORMAL HIGH (ref 8–23)
CO2: 18 mmol/L — ABNORMAL LOW (ref 22–32)
Calcium: 7.8 mg/dL — ABNORMAL LOW (ref 8.9–10.3)
Chloride: 98 mmol/L (ref 98–111)
Creatinine, Ser: 3.02 mg/dL — ABNORMAL HIGH (ref 0.44–1.00)
GFR, Estimated: 15 mL/min — ABNORMAL LOW (ref >60)
Glucose, Bld: 178 mg/dL — ABNORMAL HIGH (ref 70–99)
Potassium: 3.5 mmol/L (ref 3.5–5.1)
Sodium: 127 mmol/L — ABNORMAL LOW (ref 135–145)

## 2020-11-14 LAB — URINALYSIS, ROUTINE W REFLEX MICROSCOPIC
Bilirubin Urine: NEGATIVE
Glucose, UA: NEGATIVE mg/dL
Ketones, ur: NEGATIVE mg/dL
Nitrite: NEGATIVE
Protein, ur: 100 mg/dL — AB
Specific Gravity, Urine: 1.008 (ref 1.005–1.030)
pH: 6 (ref 5.0–8.0)

## 2020-11-14 LAB — LIPASE, BLOOD: Lipase: 35 U/L (ref 11–51)

## 2020-11-14 LAB — HEPATIC FUNCTION PANEL
ALT: 37 U/L (ref 0–44)
AST: 75 U/L — ABNORMAL HIGH (ref 15–41)
Albumin: 2.7 g/dL — ABNORMAL LOW (ref 3.5–5.0)
Alkaline Phosphatase: 38 U/L (ref 38–126)
Bilirubin, Direct: 0.1 mg/dL (ref 0.0–0.2)
Indirect Bilirubin: 0.6 mg/dL (ref 0.3–0.9)
Total Bilirubin: 0.7 mg/dL (ref 0.3–1.2)
Total Protein: 5.5 g/dL — ABNORMAL LOW (ref 6.5–8.1)

## 2020-11-14 LAB — TROPONIN I (HIGH SENSITIVITY)
Troponin I (High Sensitivity): 181 ng/L (ref <18)
Troponin I (High Sensitivity): 271 ng/L (ref <18)

## 2020-11-14 LAB — LACTIC ACID, PLASMA: Lactic Acid, Venous: 1.2 mmol/L (ref 0.5–1.9)

## 2020-11-14 MED ORDER — ALBUTEROL SULFATE HFA 108 (90 BASE) MCG/ACT IN AERS
2.0000 | INHALATION_SPRAY | Freq: Four times a day (QID) | RESPIRATORY_TRACT | Status: DC
  Administered 2020-11-14 – 2020-11-19 (×14): 2 via RESPIRATORY_TRACT
  Filled 2020-11-14 (×3): qty 6.7

## 2020-11-14 MED ORDER — METHYLPREDNISOLONE SODIUM SUCC 125 MG IJ SOLR
0.5000 mg/kg | Freq: Two times a day (BID) | INTRAMUSCULAR | Status: AC
  Administered 2020-11-14 – 2020-11-17 (×6): 43.75 mg via INTRAVENOUS
  Filled 2020-11-14 (×6): qty 2

## 2020-11-14 MED ORDER — PREDNISONE 50 MG PO TABS
50.0000 mg | ORAL_TABLET | Freq: Every day | ORAL | Status: DC
  Administered 2020-11-17 – 2020-11-20 (×4): 50 mg via ORAL
  Filled 2020-11-14 (×5): qty 1

## 2020-11-14 MED ORDER — ONDANSETRON HCL 4 MG/2ML IJ SOLN
4.0000 mg | Freq: Once | INTRAMUSCULAR | Status: AC
  Administered 2020-11-14: 4 mg via INTRAVENOUS
  Filled 2020-11-14: qty 2

## 2020-11-14 MED ORDER — SODIUM CHLORIDE 0.9 % IV SOLN
200.0000 mg | Freq: Once | INTRAVENOUS | Status: AC
  Administered 2020-11-14: 200 mg via INTRAVENOUS
  Filled 2020-11-14: qty 40

## 2020-11-14 MED ORDER — SODIUM CHLORIDE 0.9 % IV SOLN
100.0000 mg | Freq: Every day | INTRAVENOUS | Status: AC
  Administered 2020-11-15 – 2020-11-18 (×4): 100 mg via INTRAVENOUS
  Filled 2020-11-14 (×2): qty 100
  Filled 2020-11-14: qty 2.5
  Filled 2020-11-14: qty 20

## 2020-11-14 NOTE — ED Notes (Signed)
Vomiting at present diagnosed with covid the 1st of january

## 2020-11-14 NOTE — ED Triage Notes (Signed)
Pt to triage via [facility] from home.  COVID + since 1/2.  C/o generalized weakness and nausea.  Called EMS earlier today and received 1 liter NS and Zofran 8mg .

## 2020-11-14 NOTE — H&P (Signed)
Brenda Patterson EGB:[id number] DOB: 13-Oct-1941 DOA: 11/14/2020     PCP: [name], MD     Patient arrived to ER on 11/14/20 at 1342 Referred by Attending Tegeler, Gwenyth Allegra, *   Patient coming from: home Lives   With family    Chief Complaint:   Chief Complaint  Patient presents with   Covid/ weakness   HPI: Brenda Patterson is a 80 y.o. female with medical history significant of HTn, DM2, CKD, hypothyroidism  Covid positive    Presented with  N/v/d for 1 wk with worsening complaints Reports she has been symptomatic since 1 2022-12-31 Tested positive for Covid on 2nd of December 31, 2022 Not   reporting any respiratory symptoms but rather significant GI symptoms No associated chest pain or shortness of breath Patient too weak to answer most of the questions. Per records her daughter has passed away January 01, 2020 And she has had some problems with medication  compliance since likely secondary to grief and depression.    Infectious risk factors:  Reports  N/V/Diarrhea   severe fatigue     KNOWN COVID POSITIVE   Has NOt been vaccinated against COVID    Initial COVID TEST   in house  PCR testing  Pending  No results found for: SARSCOV2NAA   Regarding pertinent Chronic problems:     Hyperlipidemia -  on statins Mevacor Lipid Panel     Component Value Date/Time   CHOL 193 07/13/2020 1142   TRIG 274 (H) 07/13/2020 1142   HDL 38 (L) 07/13/2020 1142   CHOLHDL 5.1 (H) 07/13/2020 1142   VLDL 51.0 (H) 11/04/2018 0935   LDLCALC 116 (H) 07/13/2020 1142   LDLDIRECT 111.0 11/04/2018 0935     HTN on Coreg   chronic CHF diastolic  - last echo 3710 Diastolic CHF    DM 2 -  Lab Results  Component Value Date   HGBA1C 7.5 (H) 07/13/2020   on  PO meds only,     Hypothyroidism:  Lab Results  Component Value Date   TSH 5.12 (H) 07/13/2020   on synthroid   obesity-   BMI Readings from Last 1 Encounters:  11/12/20 35.12 kg/m       CKD stage IIIb- baseline Cr  2.5 Estimated Creatinine Clearance: 15.7 mL/min (A) (by C-G formula based on SCr of 3.02 mg/dL (H)).  Lab Results  Component Value Date   CREATININE 3.02 (H) 11/14/2020   CREATININE 3.09 (H) 11/12/2020   CREATININE 2.55 (H) 07/13/2020      Chronic anemia - baseline hg Hemoglobin & Hematocrit  Recent Labs    07/13/20 1142 11/12/20 12-31-2118 11/14/20 1451  HGB 10.4* 11.8* 10.4*    While in ER:  Elevated troponin AKI CXR showing PNA Had bedside echo High output appears dry   ER Provider Called:  Cardiology Dr.Rose  They Recommend admit to medicine bed side echo wall motion abnormality ?? But over all looks dry Cont to trend trop No heparin unless going up dramatically   seen in   ER and will continue to follow    Hospitalist was called for admission for COVID infection with multifocal pNA and okay  The following Work up has been ordered so far:  Orders Placed This Encounter  Procedures   Urine culture   DG Chest Portable 1 View   Basic metabolic panel   CBC   Urinalysis, Routine w reflex microscopic   Hepatic function panel   Lipase, blood   Lactic  acid, plasma   Document Height and Actual Weight   Inpatient consult to Cardiology  ALL PATIENTS BEING ADMITTED/HAVING PROCEDURES NEED COVID-19 SCREENING   Consult to hospitalist  ALL PATIENTS BEING ADMITTED/HAVING PROCEDURES NEED COVID-19 SCREENING   ED EKG   EKG 12-Lead    Following Medications were ordered in ER: Medications  ondansetron (ZOFRAN) injection 4 mg (4 mg Intravenous Given 11/14/20 2017)        Consult Orders  (From admission, onward)         Start     Ordered   11/14/20 2019  Consult to hospitalist  ALL PATIENTS BEING ADMITTED/HAVING PROCEDURES NEED COVID-19 SCREENING Paged by Jerene Pitch  Once       Comments: ALL PATIENTS BEING ADMITTED/HAVING PROCEDURES NEED COVID-19 SCREENING  Provider:  (Not yet assigned)  Question Answer Comment  Place call to: [facility]   Reason for  Consult Admit      11/14/20 2018          Significant initial  Findings: Abnormal Labs Reviewed  BASIC METABOLIC PANEL - Abnormal; Notable for the following components:      Result Value   Sodium 127 (*)    CO2 18 (*)    Glucose, Bld 178 (*)    BUN 35 (*)    Creatinine, Ser 3.02 (*)    Calcium 7.8 (*)    GFR, Estimated 15 (*)    All other components within normal limits  CBC - Abnormal; Notable for the following components:   RBC 3.48 (*)    Hemoglobin 10.4 (*)    HCT 31.9 (*)    Platelets 123 (*)    All other components within normal limits  HEPATIC FUNCTION PANEL - Abnormal; Notable for the following components:   Total Protein 5.5 (*)    Albumin 2.7 (*)    AST 75 (*)    All other components within normal limits  TROPONIN I (HIGH SENSITIVITY) - Abnormal; Notable for the following components:   Troponin I (High Sensitivity) 181 (*)    All other components within normal limits  TROPONIN I (HIGH SENSITIVITY) - Abnormal; Notable for the following components:   Troponin I (High Sensitivity) 271 (*)    All other components within normal limits   Otherwise labs showing:    Recent Labs  Lab 11/12/20 2120 11/14/20 1451  NA 131* 127*  K 3.6 3.5  CO2 21* 18*  GLUCOSE 167* 178*  BUN 42* 35*  CREATININE 3.09* 3.02*  CALCIUM 8.6* 7.8*    Cr   Up from baseline see below 2.5 Lab Results  Component Value Date   CREATININE 3.02 (H) 11/14/2020   CREATININE 3.09 (H) 11/12/2020   CREATININE 2.55 (H) 07/13/2020    Recent Labs  Lab 11/12/20 2120 11/14/20 1922  AST 41 75*  ALT 29 37  ALKPHOS 55 38  BILITOT 0.6 0.7  PROT 6.8 5.5*  ALBUMIN 3.4* 2.7*   Lab Results  Component Value Date   CALCIUM 7.8 (L) 11/14/2020   PHOS 2.6 12/11/2008     WBC      Component Value Date/Time   WBC 8.2 11/14/2020 1451   LYMPHSABS 2.0 11/12/2020 2120   MONOABS 0.5 11/12/2020 2120   EOSABS 0.0 11/12/2020 2120   BASOSABS 0.0 11/12/2020 2120    Plt: Lab Results  Component  Value Date   PLT 123 (L) 11/14/2020   Lactic Acid, Venous    Component Value Date/Time   LATICACIDVEN 1.2 11/14/2020 1922  Procalcitonin 0.16   COVID-19 Labs  Recent Labs    11/14/20 2344  LDH 296*   HG/HCT  Stable     Component Value Date/Time   HGB 10.4 (L) 11/14/2020 1451   HCT 31.9 (L) 11/14/2020 1451   MCV 91.7 11/14/2020 1451    Recent Labs  Lab 11/14/20 1922  LIPASE 35      Troponin  181- 271-262 Cardiac Panel (last 3 results) Recent Labs    11/14/20 2344  CKTOTAL 1,211*     ECG: Ordered Personally reviewed by me showing: HR : 83 Rhythm: LBBB,    no evidence of ischemic changes QTC  579    DM  labs:  HbA1C: Recent Labs    07/13/20 1142  HGBA1C 7.5*     CBG (last 3)  Recent Labs    11/15/20 0126  GLUCAP 189*      UA   no evidence of UTI     Urine analysis:    Component Value Date/Time   COLORURINE YELLOW 11/14/2020 2216   APPEARANCEUR HAZY (A) 11/14/2020 2216   LABSPEC 1.008 11/14/2020 2216   PHURINE 6.0 11/14/2020 2216   GLUCOSEU NEGATIVE 11/14/2020 2216   HGBUR MODERATE (A) 11/14/2020 2216   BILIRUBINUR NEGATIVE 11/14/2020 2216   BILIRUBINUR neg 02/15/2012 1523   KETONESUR NEGATIVE 11/14/2020 2216   PROTEINUR 100 (A) 11/14/2020 2216   UROBILINOGEN 0.2 11/21/2012 1035   NITRITE NEGATIVE 11/14/2020 2216   LEUKOCYTESUR TRACE (A) 11/14/2020 2216    Ordered   CXR - multifocal PNA   ED Triage Vitals  Enc Vitals Group     BP 11/14/20 1414 (!) 146/73     Pulse Rate 11/14/20 1414 80     Resp 11/14/20 1414 (!) 22     Temp 11/14/20 1414 99.3 F (37.4 C)     Temp Source 11/14/20 1414 Oral     SpO2 11/14/20 1414 96 %     Weight --      Height --      Head Circumference --      Peak Flow --      Pain Score 11/14/20 1443 0     Pain Loc --      Pain Edu? --      Excl. in [place]? --   TMAX(24)@       Latest  Blood pressure 106/67, pulse 92, temperature 99 F (37.2 C), temperature source Oral, resp. rate (!) 22, SpO2 98 %.      Review of Systems:    Pertinent positives include:  fatigue,   abdominal pain, nausea, vomiting, diarrhea, Constitutional:  No weight loss, night sweats, Fevers, chills, weight loss  HEENT:  No headaches, Difficulty swallowing,Tooth/dental problems,Sore throat,  No sneezing, itching, ear ache, nasal congestion, post nasal drip,  Cardio-vascular:  No chest pain, Orthopnea, PND, anasarca, dizziness, palpitations.no Bilateral lower extremity swelling  GI:  No heartburn, indigestion, change in bowel habits, loss of appetite, melena, blood in stool, hematemesis Resp:  no shortness of breath at rest. No dyspnea on exertion, No excess mucus, no productive cough, No non-productive cough, No coughing up of blood.No change in color of mucus.No wheezing. Skin:  no rash or lesions. No jaundice GU:  no dysuria, change in color of urine, no urgency or frequency. No straining to urinate.  No flank pain.  Musculoskeletal:  No joint pain or no joint swelling. No decreased range of motion. No back pain.  Psych:  No change in mood or affect. No depression  or anxiety. No memory loss.  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 She has never used smokeless tobacco. She reports that she does not drink alcohol and does not use drugs.   Family History:   Family History  Problem Relation Age of Onset   Arthritis Other        fhx   Hyperlipidemia Other         fhx   Hypertension Other        fhx   Diabetes Other        fhx   Stroke Other        fhx   Breast cancer Sister     Allergies: Allergies  Allergen Reactions   Azithromycin Nausea Only   Heparin     Patient states it makes her weak   Penicillins Rash    Rash noted inside her mouth and lips     Prior to Admission medications   Medication Sig Start Date End Date Taking? Authorizing Provider  albuterol (VENTOLIN HFA) 108 (90 Base) MCG/ACT inhaler Inhale 2 puffs into the lungs every 6 (six) hours as needed for wheezing or shortness of breath. 08/10/20 09/09/20  Burchette, Alinda Sierras, MD  Ascorbic Acid (VITAMIN C) 1000 MG tablet Take 1,000 mg by mouth daily.    [provider]  aspirin EC 81 MG tablet Take 81 mg by mouth daily.      [provider]  Calcium Carbonate (CALCIUM 600 PO) Take 600 mg by mouth 2 (two) times daily.     [provider]  carvedilol (COREG) 6.25 MG tablet TAKE 1 TABLET BY MOUTH  TWICE DAILY 06/23/20   Burchette, Alinda Sierras, MD  cholecalciferol (VITAMIN D) 1000 UNITS tablet Take 5,000 Units by mouth daily.     [provider]  fluticasone (FLONASE) 50 MCG/ACT nasal spray Place 2 sprays into both nostrils daily. 11/05/17   Noemi Chapel, MD  glipiZIDE (GLUCOTROL XL) 2.5 MG 24 hr tablet TAKE 1 TABLET BY MOUTH  DAILY WITH BREAKFAST 06/23/20   Burchette, Alinda Sierras, MD  hydrochlorothiazide (HYDRODIURIL) 25 MG tablet TAKE 1 TABLET BY MOUTH  DAILY 06/23/20   Burchette, Alinda Sierras, MD  levothyroxine (SYNTHROID) 25 MCG tablet TAKE 1 TABLET BY MOUTH ONCE DAILY WITH BREAKFAST 08/18/20   Burchette, Alinda Sierras, MD  lisinopril (ZESTRIL) 20 MG tablet TAKE ONE-HALF TABLET BY  MOUTH DAILY 09/29/20   Burchette, Alinda Sierras, MD  loperamide (IMODIUM A-D) 2 MG tablet Take 1 tablet (2 mg total) by mouth 4 (four) times daily as needed for diarrhea or loose stools. 11/12/20   Fredia Sorrow, MD  Multiple Vitamin (MULTIVITAMIN) tablet Take 1 tablet by mouth daily.       [provider]  NIFEdipine (PROCARDIA-XL/NIFEDICAL-XL) 30 MG 24 hr tablet TAKE 1 TABLET BY MOUTH  TWICE DAILY 06/23/20   Burchette,  Alinda Sierras, MD  ondansetron (ZOFRAN) 4 MG tablet Take 1 tablet (4 mg total) by mouth every 8 (eight) hours as needed for nausea or vomiting. 11/09/20   Lucretia Kern, DO  rosuvastatin (CRESTOR) 20 MG tablet Take 1 tablet (20 mg total) by mouth daily. 08/10/20   Burchette, Alinda Sierras, MD  sertraline (ZOLOFT) 50 MG tablet TAKE 1 TABLET BY MOUTH  DAILY 08/18/20   [name], MD   Physical Exam: Vitals with BMI 11/14/2020 11/14/2020 11/14/2020  Height - - -  Weight - - -  BMI - - -  Systolic 128 786 767  Diastolic 67 76 99  Pulse 92 83 87     1. General:  in  Acute distress tearful  Chronically ill  and acutely ill -appearing 2. Psychological: Alert and Oriented 3. Head/ENT:    Dry Mucous Membranes                          Head Non traumatic, neck supple                           Poor Dentition 4. SKIN:   decreased Skin turgor,  Skin clean Dry and intact no rash 5. Heart: Regular rate and rhythm no Murmur, no Rub or gallop 6. Lungs:  no wheezes or crackles   7. Abdomen: Soft,  non-tender, Non distended   obese  bowel sounds present 8. Lower extremities: no clubbing, cyanosis, no  edema 9. Neurologically Grossly intact, moving all 4 extremities equally   10. MSK: Normal range of motion   All other LABS:     Recent Labs  Lab 11/12/20 2120 11/14/20 1451  WBC 6.6 8.2  NEUTROABS 4.1  --   HGB 11.8* 10.4*  HCT 34.6* 31.9*  MCV 91.1 91.7  PLT 154 123*     Recent Labs  Lab 11/12/20 2120 11/14/20 1451  NA 131* 127*  K 3.6 3.5  CL 98 98  CO2 21* 18*  GLUCOSE 167* 178*  BUN 42* 35*  CREATININE 3.09* 3.02*  CALCIUM 8.6* 7.8*     Recent Labs  Lab 11/12/20 2120 11/14/20 1922  AST 41 75*  ALT 29 37  ALKPHOS 55 38  BILITOT 0.6 0.7  PROT 6.8 5.5*  ALBUMIN 3.4* 2.7*      Cultures:    Component Value Date/Time   SDES STOOL  12/11/2008 2239   SPECREQUEST IMMUNE:NORM 12/11/2008 2239   CULT  12/11/2008 2239    NO SALMONELLA, SHIGELLA, CAMPYLOBACTER, OR YERSINIA ISOLATED   REPTSTATUS 12/16/2008 FINAL 12/11/2008 2239     Radiological Exams on Admission: DG Chest Portable 1 View  Result Date: 11/14/2020 CLINICAL DATA:  80 year old female with positive COVID-19. EXAM: PORTABLE CHEST 1 VIEW COMPARISON:  Chest radiograph date 06/05/2018 FINDINGS: Bilateral confluent and streaky densities consistent with multifocal pneumonia and in keeping with COVID-19. Clinical correlation is recommended there is no pleural effusion pneumothorax. There is mild cardiomegaly. Atherosclerotic calcification of the aorta. No acute osseous pathology. IMPRESSION: Multifocal pneumonia. Electronically Signed   By: Anner Crete M.D.   On: 11/14/2020 19:39    Chart has been reviewed    Assessment/Plan  Krisanne Lich is a 80 y.o. female with medical history significant of HTn, DM2, CKD, hypothyroidism recently Covid positive  Admitted for severe dehydration rhabdomyolysis and COVID-pneumonia  Present on Admission:  Pneumonia due to COVID-19 virus -   FROM HOME  WITH KNOWN HX OF COVID19 ER Novel Corona Virus testing:  Ordered 11/15/20 and is  Pending   Immunization status: states non vaccinated    Following concerning LAB/ imaging findings:  COVID-19 Labs  Recent Labs    11/14/20 2344  LDH 296*   Hepatic Function Latest Ref Rng & Units 11/14/2020 11/12/2020 07/13/2020  Total Protein 6.5 - 8.1 g/dL 5.5(L) 6.8 6.1  Albumin 3.5 - 5.0 g/dL 2.7(L) 3.4(L) -  AST 15 - 41 U/L 75(H) 41 16  ALT 0 - 44 U/L 37 29 11  Alk Phosphatase 38 - 126 U/L 38 55 -  Total Bilirubin 0.3 - 1.2 mg/dL 0.7 0.6 0.5  Bilirubin, Direct 0.0 - 0.2 mg/dL 0.1 - 0.1   Lab Results  Component Value Date   CREATININE 3.02 (H) 11/14/2020   CREATININE 3.09 (H) 11/12/2020   CBC    Component Value Date/Time   WBC 8.2 11/14/2020 1451   PLT 123 (L)  11/14/2020 1451   LYMPHSABS 2.0 11/12/2020 2120    No results found for: SARSCOV2NAA   ANC/ALC ratio>3.5 BMP: increased BUN/Cr   LFTs: increased AST/ALT/Tbili   CRP, LDH: increased     Procalcitonin: low   CXR: hazy bilateral peripheral opacities or otherwise abnormal      -Following work-up initiated:      sputum cultures  Ordered 11/15/20,     Following complications noted:  elevated troponin noted likely demand ischemia will continue to follow   evidence of AKI - will provide gentle rehydration   elevated LFT's likely in the setting of COVID continue to follow  nausea/vomiting Diarrhea , decreased PO intake resulting in dehydration - will rehydrate   Plan of treatment: Admit on Airborn Precautions  -given severity of illness initiate steroids Decadron 40m q 24 hours And pharmacy consult for remdesivir     - Will follow daily d.dimer - Assess for ability to prone  - Supportive management -Fluid sparing resuscitation Given elevated CK iv fluids given but may be able to DC in AM if able to tolerate PO and doing better  -Provide oxygen as needed currently on   SpO2: 95 % RA - pt is allergic to heaparin    Poor Prognostic factors  80y.o.  Personal hx of DM2,  HTN, obesity, NON-Vaccinated status Evidence of  organ damage  Present  elevated trop, AKI,  tachypnea, tachycardia present on admission  ABS neutrophil to lymphocyte ratio >3.5    Will order Airborne and Contact precautions  Family/ patient prognosis discussion: I have discussed case with the family who are aware of their prognosis At this point they would like   to be full code   Elevated troponin abnormal EKG-appreciate cardiology input order official cardiac echogram continue to cycle cardiac enzymes hold off on heparin for now troponin appears to be stable.  Patient has significant CKD and COVID-positive elevated troponin most likely secondary to demand ischemia    Essential hypertension - given  transient hypotension in EF will hold off on home meds   Rhabdomyolysis -rehydrate and recheck CK in AM   Obesity (BMI 30-39.9) - chronic stable   Nausea vomiting and diarrhea most likely secondary to COVID-19 symptomatic management   Hyperlipidemia -continue statins if able to tolerate    acute on chronic CKD (chronic kidney disease) stage 3, GFR 30-59 ml/min (HCC) -   chronic avoid nephrotoxic medications such as NSAIDs, Vanco Zosyn combo,  avoid hypotension, continue to follow renal function, obtain renal lytes and check  FeNa   Anemia of chronic disease -obtain anemia panel this appears to be chronic    DM 2-  - Order Sensitive SSI   -  check TSH and HgA1C  - Hold by mouth medications    Other plan as per orders.  DVT prophylaxis:    Lovenox       Code Status:    Code Status: Prior FULL CODE as per patient   I had personally discussed CODE STATUS with patient    Family Communication:   Family not at  Bedside    Disposition Plan:    likely will need placement for rehabilitation                              Following barriers for discharge:                            Electrolytes corrected                                                          Will need to be able to tolerate PO                            Will likely need home health,                             Will need consultants to evaluate patient prior to discharge                    Would benefit from PT/OT eval prior to DC  Ordered                                       Consults called: Cardiology is aware, continue to follow  Admission status:  ED Disposition    ED Disposition Condition [place]: [place] [[id number]]  Level of Care: Telemetry Cardiac [[id number]]  I expect the patient will be discharged within 24 hours: No (not a candidate for 5C-Observation unit)  Covid Evaluation: Confirmed COVID Positive  Diagnosis: Pneumonia due to COVID-19 virus [[id number]]   Admitting Physician: Toy Baker [[id number]]  Attending Physician: Toy Baker [[id number]]       Obs   Level of care    tele  For  24H     No results found for: SARSCOV2NAA   Precautions: admitted as  covid positive Airborne and Contact precautions    PPE: Used by the provider:    P100  eye Goggles,  Gloves  gown     Timothee Gali 11/15/2020, 2:02 AM     [facility]     after 2 AM please page floor coverage PA If 7AM-7PM, please contact the day team taking care of the patient using [email or web address]   Patient was evaluated in the context of the global COVID-19 pandemic, which necessitated consideration that the patient might be at risk for infection with the SARS-CoV-2 virus that causes COVID-19. Institutional protocols and algorithms that pertain to the evaluation of patients  at risk for COVID-19 are in a state of rapid change based on information released by regulatory bodies including the CDC and federal and state organizations. These policies and algorithms were followed during the patient's care.

## 2020-11-14 NOTE — ED Provider Notes (Signed)
[facility] EMERGENCY DEPARTMENT Provider Note   CSN: [id number] Arrival date & time: 11/14/20  1342     History Chief Complaint  Patient presents with  . Covid/ weakness    Brenda Patterson is a 80 y.o. female.  The history is provided by the patient and medical records. No language interpreter was used.  Emesis Severity:  Severe Duration:  1 week Timing:  Constant Quality:  Stomach contents Progression:  Worsening Chronicity:  New Recent urination:  Normal Relieved by:  Nothing Worsened by:  Nothing Ineffective treatments:  None tried Associated symptoms: chills, diarrhea and fever   Associated symptoms: no abdominal pain, no cough, no headaches, no sore throat and no URI        Past Medical History:  Diagnosis Date  . Allergy   . Arthritis   . 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 Diabetes mellitus   . Hyperlipidemia   . Hypertension   . Migraines     Patient Active Problem List   Diagnosis Date Noted  . Elevated TSH 02/21/2019  . Osteopenia 10/22/2017  . Acute frontal sinusitis 11/11/2015  . CKD (chronic kidney disease) stage 3, GFR 30-59 ml/min (HCC) 10/26/2015  . Anemia of chronic disease   . PNA (pneumonia) 12/26/2014  . Nausea vomiting and diarrhea 01/13/2014  . Obesity (BMI 30-39.9) 09/25/2013  . INGROWN TOENAIL 06/16/2010  . Hyperlipidemia 10/27/2009  . History of depression 10/27/2009  . ANEMIA, HX OF 03/25/2009  . Type 2 diabetes mellitus, controlled ([place]) 02/25/2009  . Essential hypertension 02/25/2009  . Allergic rhinitis 02/25/2009    Past Surgical History:  Procedure Laterality Date  . CHOLECYSTECTOMY    . TUBAL LIGATION       OB History   No obstetric history on file.     Family History  Problem Relation Age of Onset  . Arthritis Other        fhx  . Hyperlipidemia Other        fhx  . Hypertension Other        fhx  . Diabetes Other        fhx  . Stroke Other        fhx  .  Breast cancer Sister     Social History   Tobacco Use  . Smoking status: Never Smoker  . Smokeless tobacco: Never Used  Vaping Use  . Vaping Use: Never used  Substance Use Topics  . Alcohol use: No  . Drug use: No    Home Medications Prior to Admission medications   Medication Sig Start Date End Date Taking? Authorizing Provider  albuterol (VENTOLIN HFA) 108 (90 Base) MCG/ACT inhaler Inhale 2 puffs into the lungs every 6 (six) hours as needed for wheezing or shortness of breath. 08/10/20 09/09/20  Burchette, Alinda Sierras, MD  Ascorbic Acid (VITAMIN C) 1000 MG tablet Take 1,000 mg by mouth daily.    [provider]  aspirin EC 81 MG tablet Take 81 mg by mouth daily.      [provider]  Calcium Carbonate (CALCIUM 600 PO) Take 600 mg by mouth 2 (two) times daily.     [provider]  carvedilol (COREG) 6.25 MG tablet TAKE 1 TABLET BY MOUTH  TWICE DAILY 06/23/20   Burchette, Alinda Sierras, MD  cholecalciferol (VITAMIN D) 1000 UNITS tablet Take 5,000 Units by mouth daily.     [provider]  fluticasone (FLONASE) 50 MCG/ACT nasal  spray Place 2 sprays into both nostrils daily. 11/05/17   Noemi Chapel, MD  glipiZIDE (GLUCOTROL XL) 2.5 MG 24 hr tablet TAKE 1 TABLET BY MOUTH  DAILY WITH BREAKFAST 06/23/20   Burchette, Alinda Sierras, MD  hydrochlorothiazide (HYDRODIURIL) 25 MG tablet TAKE 1 TABLET BY MOUTH  DAILY 06/23/20   Burchette, Alinda Sierras, MD  levothyroxine (SYNTHROID) 25 MCG tablet TAKE 1 TABLET BY MOUTH ONCE DAILY WITH BREAKFAST 08/18/20   Burchette, Alinda Sierras, MD  lisinopril (ZESTRIL) 20 MG tablet TAKE ONE-HALF TABLET BY  MOUTH DAILY 09/29/20   Burchette, Alinda Sierras, MD  loperamide (IMODIUM A-D) 2 MG tablet Take 1 tablet (2 mg total) by mouth 4 (four) times daily as needed for diarrhea or loose stools. 11/12/20   Fredia Sorrow, MD  Multiple Vitamin (MULTIVITAMIN) tablet Take 1 tablet by mouth daily.      [provider]  NIFEdipine (PROCARDIA-XL/NIFEDICAL-XL) 30 MG  24 hr tablet TAKE 1 TABLET BY MOUTH  TWICE DAILY 06/23/20   Burchette, Alinda Sierras, MD  ondansetron (ZOFRAN) 4 MG tablet Take 1 tablet (4 mg total) by mouth every 8 (eight) hours as needed for nausea or vomiting. 11/09/20   Lucretia Kern, DO  rosuvastatin (CRESTOR) 20 MG tablet Take 1 tablet (20 mg total) by mouth daily. 08/10/20   Burchette, Alinda Sierras, MD  sertraline (ZOLOFT) 50 MG tablet TAKE 1 TABLET BY MOUTH  DAILY 08/18/20   Burchette, Alinda Sierras, MD    Allergies    Azithromycin, Heparin, and Penicillins  Review of Systems   Review of Systems  Constitutional: Positive for chills, fatigue and fever. Negative for diaphoresis.  HENT: Negative for congestion and sore throat.   Eyes: Negative for visual disturbance.  Respiratory: Negative for cough, chest tightness, shortness of breath and wheezing.   Cardiovascular: Negative for chest pain, palpitations and leg swelling.  Gastrointestinal: Positive for diarrhea, nausea and vomiting. Negative for abdominal pain and constipation.  Genitourinary: Negative for dysuria.  Musculoskeletal: Negative for back pain, neck pain and neck stiffness.  Neurological: Positive for light-headedness. Negative for dizziness, syncope, weakness and headaches.  Psychiatric/Behavioral: Negative for agitation and confusion.  All other systems reviewed and are negative.   Physical Exam Updated Vital Signs BP (!) 176/85   Pulse 80   Temp 99.4 F (37.4 C) (Oral)   Resp (!) 24   SpO2 98%   Physical Exam Vitals and nursing note reviewed.  Constitutional:      General: She is not in acute distress.    Appearance: She is well-developed and well-nourished. She is ill-appearing. She is not toxic-appearing or diaphoretic.  HENT:     Head: Normocephalic and atraumatic.     Nose: Nose normal. No congestion or rhinorrhea.     Mouth/Throat:     Mouth: Mucous membranes are dry.     Pharynx: No oropharyngeal exudate or posterior oropharyngeal erythema.  Eyes:     Extraocular  Movements: Extraocular movements intact.     Conjunctiva/sclera: Conjunctivae normal.     Pupils: Pupils are equal, round, and reactive to light.  Cardiovascular:     Rate and Rhythm: Normal rate and regular rhythm.     Pulses: Normal pulses.     Heart sounds: No murmur heard.   Pulmonary:     Effort: Pulmonary effort is normal. No respiratory distress.     Breath sounds: Rhonchi present. No wheezing or rales.  Chest:     Chest wall: No tenderness.  Abdominal:     Palpations:  Abdomen is soft.     Tenderness: There is no abdominal tenderness. There is no guarding or rebound.     Hernia: No hernia is present.  Musculoskeletal:        General: No tenderness.     Cervical back: Neck supple.     Right lower leg: Edema present.     Left lower leg: Edema present.  Skin:    General: Skin is warm and dry.     Capillary Refill: Capillary refill takes less than 2 seconds.     Findings: No erythema.  Neurological:     General: No focal deficit present.     Mental Status: She is alert.  Psychiatric:        Mood and Affect: Mood and affect and mood normal.     ED Results / Procedures / Treatments   Labs (all labs ordered are listed, but only abnormal results are displayed) Labs Reviewed  BASIC METABOLIC PANEL - Abnormal; Notable for the following components:      Result Value   Sodium 127 (*)    CO2 18 (*)    Glucose, Bld 178 (*)    BUN 35 (*)    Creatinine, Ser 3.02 (*)    Calcium 7.8 (*)    GFR, Estimated 15 (*)    All other components within normal limits  CBC - Abnormal; Notable for the following components:   RBC 3.48 (*)    Hemoglobin 10.4 (*)    HCT 31.9 (*)    Platelets 123 (*)    All other components within normal limits  HEPATIC FUNCTION PANEL - Abnormal; Notable for the following components:   Total Protein 5.5 (*)    Albumin 2.7 (*)    AST 75 (*)    All other components within normal limits  TROPONIN I (HIGH SENSITIVITY) - Abnormal; Notable for the following  components:   Troponin I (High Sensitivity) 181 (*)    All other components within normal limits  TROPONIN I (HIGH SENSITIVITY) - Abnormal; Notable for the following components:   Troponin I (High Sensitivity) 271 (*)    All other components within normal limits  URINE CULTURE  LIPASE, BLOOD  LACTIC ACID, PLASMA  URINALYSIS, ROUTINE W REFLEX MICROSCOPIC  C-REACTIVE PROTEIN  D-DIMER, QUANTITATIVE (NOT AT [facility])  FERRITIN  FIBRINOGEN  LACTATE DEHYDROGENASE  PROCALCITONIN    EKG EKG Interpretation  Date/Time:  Sunday November 14 2020 19:23:17 EST Ventricular Rate:  83 PR Interval:    QRS Duration: 164 QT Interval:  467 QTC Calculation: 549 R Axis:   57 Text Interpretation: Sinus rhythm Probable left atrial enlargement Left bundle branch block When comaprd to prior before today, new LBBB. Per cards, no STEMI Confirmed by Antony Blackbird [phone]) on 11/14/2020 8:24:14 PM   Radiology DG Chest Portable 1 View  Result Date: 11/14/2020 CLINICAL DATA:  80 year old female with positive COVID-19. EXAM: PORTABLE CHEST 1 VIEW COMPARISON:  Chest radiograph date 06/05/2018 FINDINGS: Bilateral confluent and streaky densities consistent with multifocal pneumonia and in keeping with COVID-19. Clinical correlation is recommended there is no pleural effusion pneumothorax. There is mild cardiomegaly. Atherosclerotic calcification of the aorta. No acute osseous pathology. IMPRESSION: Multifocal pneumonia. Electronically Signed   By: Anner Crete M.D.   On: 11/14/2020 19:39    Procedures Procedures (including critical care time)  CRITICAL CARE Performed by: Gwenyth Allegra Lavonn Maxcy Total critical care time: 35 minutes Critical care time was exclusive of separately billable procedures and treating other patients. Critical care  was necessary to treat or prevent imminent or life-threatening deterioration. Critical care was time spent personally by me on the following activities: development of treatment  plan with patient and/or surrogate as well as nursing, discussions with consultants, evaluation of patient's response to treatment, examination of patient, obtaining history from patient or surrogate, ordering and performing treatments and interventions, ordering and review of laboratory studies, ordering and review of radiographic studies, pulse oximetry and re-evaluation of patient's condition.   Medications Ordered in ED Medications  ondansetron (ZOFRAN) injection 4 mg (4 mg Intravenous Given 11/14/20 2017)    ED Course  I have reviewed the triage vital signs and the nursing notes.  Pertinent labs & imaging results that were available during my care of the patient were reviewed by me and considered in my medical decision making (see chart for details).    MDM Rules/Calculators/A&P                          Brenda Patterson is a 80 y.o. female with a past medical history significant for hypertension, hyperlipidemia, diabetes, CKD, and thyroid disease who was recently diagnosed with COVID-19 who presents with 1 week of nausea, vomiting, diarrhea, and worsening fatigue.  Patient reports that for the last few days has been having worsening nausea and vomiting and dry heaving.  EMS report that she was not hypotensive but appeared very dehydrated so they gave her some fluids and Zofran in route.  Patient is denying any chest pain or palpitations or shortness of breath.  On exam, lungs have some coarseness but chest is nontender.  Abdomen is nontender.  Bowel sounds were appreciated.  She denies any rectal bleeding.  Patient had pulses in all extremities.  Very mild edema in legs.  Patient denies any history of CAD or CHF.  EKG appears to show a left bundle branch block with some ischemic changes.  I spoke with the on-call cardiologist who is covering the STEMI pager and he does not think this is a STEMI.  He recommended I touch base with the cardiology fellow who will come see the  patient.  Troponin was elevated at 181 initially.  It subsequently trended up to 271.  Patient does have hyponatremia likely related all the nausea vomiting diarrhea.  Her creatinine is up to 3.02 which is similarly elevated to several days ago.  No leukocytosis.  Lipase not elevated.  Lactic acid nonelevated.  Due to the EKG abnormalities and troponin elevation, cardiology was called.  I spoke to the fellow  7:45 PM Cardiology came to eval in the patient.  She was having nausea.  We looked at the EKG together and cardiology feels that she is safe for Zofran.  I ordered this.  Cardiology suspect she may have a component of myocarditis with her COVID.  They did a bedside echo and are concerned she may have some wall motion abnormalities.  Patient's troponin rose from 1 80-2 71.  She is still denying chest pain or shortness of breath.  Patient will be admitted for further management of rising troponin as well as nausea, vomiting, diarrhea, and COVID symptoms.  Will call medicine for admission for potential myocarditis in the setting of COVID-19 infection.   Final Clinical Impression(s) / ED Diagnoses Final diagnoses:  COVID-19  Nausea vomiting and diarrhea  Fatigue, unspecified type  Elevated troponin    Clinical Impression: 1. COVID-19   2. Nausea vomiting and diarrhea   3. Fatigue, unspecified  type   4. Elevated troponin     Disposition: Admit  This note was prepared with assistance of Dragon voice recognition software. Occasional wrong-word or sound-a-like substitutions may have occurred due to the inherent limitations of voice recognition software.     Brenda Patterson, Gwenyth Allegra, MD 11/14/20 2130

## 2020-11-14 NOTE — ED Notes (Signed)
Pt incontinent of urine. Cleaned pt and put brief/purewick to obtain urine sample.

## 2020-11-14 NOTE — Consult Note (Incomplete)
Cardiology Consultation:   Patient ID: Brenda Patterson MRN: [id number]; DOB: 1940-12-26  Admit date: 11/14/2020 Date of Consult: 11/14/2020  Primary Care Provider: [name], Patterson Primary Cardiologist: No primary care provider on file. *** Primary Electrophysiologist:  None ***   Patient Profile:   Brenda Patterson is a 80 y.o. female with a hx of type 2 DM, CKD, hypertension, depression, and dyslipidemia who is being seen today for the evaluation of a new LBBB on ECG.  History of Present Illness:   Brenda Patterson *** nausea, weakness. No chest discomfort or shortness of breath.   New LBBB, trop 181->271 Hyponatremia,  TTE: EF 25-30%, basal inferolateral wall only mildly hypokinetic with more severe global hypokinesis. High output with stroke volume of 83 mL and CO of 7.1 L/min. RV appears under-filled and hyperdynamic Mild-moderate MR IVC collapsed No pericardial effusion  Heart Pathway Score:     Past Medical History:  Diagnosis Date  . Allergy   . Arthritis   . 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 Diabetes mellitus   . Hyperlipidemia   . Hypertension   . Migraines     Past Surgical History:  Procedure Laterality Date  . CHOLECYSTECTOMY    . TUBAL LIGATION       {Home Medications (Optional):21181}  Inpatient Medications: Scheduled Meds: . ondansetron (ZOFRAN) IV  4 mg Intravenous Once   Continuous Infusions:  PRN Meds:   Allergies:    Allergies  Allergen Reactions  . Azithromycin Nausea Only  . Heparin     Patient states it makes her weak  . Penicillins Rash    Rash noted inside her mouth and lips    Social History:   Social History   Socioeconomic History  . Marital status: Married    Spouse name: Not on file  . Number of children: Not on file  . Years of education: Not on file  . Highest education level: Not on file  Occupational History  . Not on file  Tobacco Use  . Smoking status: Never Smoker  .  Smokeless tobacco: Never Used  Vaping Use  . Vaping Use: Never used  Substance and Sexual Activity  . Alcohol use: No  . Drug use: No  . Sexual activity: Yes  Other Topics Concern  . Not on file  Social History Narrative   Works Arts development officer   Married    Social Determinants of Health   Financial Resource Strain: Honor   . Difficulty of Paying Living Expenses: Not hard at all  Food Insecurity: No Food Insecurity  . Worried About Charity fundraiser in the Last Year: Never true  . Ran Out of Food in the Last Year: Never true  Transportation Needs: No Transportation Needs  . Lack of Transportation (Medical): No  . Lack of Transportation (Non-Medical): No  Physical Activity: Inactive  . Days of Exercise per Week: 0 days  . Minutes of Exercise per Session: 0 min  Stress: No Stress Concern Present  . Feeling of Stress : Not at all  Social Connections: Moderately Isolated  . Frequency of Communication with Friends and Family: More than three times a week  . Frequency of Social Gatherings with Friends and Family: More than three times a week  . Attends Religious Services: Never  . Active Member of Clubs or Organizations: No  . Attends Archivist Meetings: Never  . Marital Status: Married  Human resources officer  Violence: Not At Risk  . Fear of Current or Ex-Partner: No  . Emotionally Abused: No  . Physically Abused: No  . Sexually Abused: No    Family History:   *** Family History  Problem Relation Age of Onset  . Arthritis Other        fhx  . Hyperlipidemia Other        fhx  . Hypertension Other        fhx  . Diabetes Other        fhx  . Stroke Other        fhx  . Breast cancer Sister      ROS:  Please see the history of present illness.  *** All other ROS reviewed and negative.     Physical Exam/Data:   Vitals:   11/14/20 1918 11/14/20 1930 11/14/20 1945 11/14/20 2000  BP: (!) 166/88 (!) 175/81 (!) 150/129 (!) 164/99  Pulse: 90  85 87  Resp:  16 (!) 22 18 (!) 21  Temp: 99 F (37.2 C)     TempSrc: Oral     SpO2: 99%  97% 98%   No intake or output data in the 24 hours ending 11/14/20 2006 Last 3 Weights 11/12/2020 08/10/2020 07/20/2020  Weight (lbs) 192 lb 193 lb 7 oz (No Data)  Weight (kg) 87.091 kg 87.743 kg (No Data)     There is no height or weight on file to calculate BMI.  General:  Well nourished, well developed, in no acute distress*** HEENT: normal Lymph: no adenopathy Neck: no JVD Endocrine:  No thryomegaly Vascular: No carotid bruits; FA pulses 2+ bilaterally without bruits  Cardiac:  normal S1, S2; RRR; no murmur *** Lungs:  clear to auscultation bilaterally, no wheezing, rhonchi or rales  Abd: soft, nontender, no hepatomegaly  Ext: no edema Musculoskeletal:  No deformities, BUE and BLE strength normal and equal Skin: warm and dry  Neuro:  CNs 2-12 intact, no focal abnormalities noted Psych:  Normal affect   EKG:  The EKG was personally reviewed and demonstrates:  *** Telemetry:  Telemetry was personally reviewed and demonstrates:  ***  Relevant CV Studies: ***  Laboratory Data:  High Sensitivity Troponin:   Recent Labs  Lab 11/14/20 1703  TROPONINIHS 181*     Chemistry Recent Labs  Lab 11/12/20 2120 11/14/20 1451  NA 131* 127*  K 3.6 3.5  CL 98 98  CO2 21* 18*  GLUCOSE 167* 178*  BUN 42* 35*  CREATININE 3.09* 3.02*  CALCIUM 8.6* 7.8*  GFRNONAA 15* 15*  ANIONGAP 12 11    Recent Labs  Lab 11/12/20 2120  PROT 6.8  ALBUMIN 3.4*  AST 41  ALT 29  ALKPHOS 55  BILITOT 0.6   Hematology Recent Labs  Lab 11/12/20 2120 11/14/20 1451  WBC 6.6 8.2  RBC 3.80* 3.48*  HGB 11.8* 10.4*  HCT 34.6* 31.9*  MCV 91.1 91.7  MCH 31.1 29.9  MCHC 34.1 32.6  RDW 12.3 12.1  PLT 154 123*   BNPNo results for input(s): BNP, PROBNP in the last 168 hours.  DDimer No results for input(s): DDIMER in the last 168 hours.   Radiology/Studies:  DG Chest Portable 1 View  Result Date:  11/14/2020 CLINICAL DATA:  80 year old female with positive COVID-19. EXAM: PORTABLE CHEST 1 VIEW COMPARISON:  Chest radiograph date 06/05/2018 FINDINGS: Bilateral confluent and streaky densities consistent with multifocal pneumonia and in keeping with COVID-19. Clinical correlation is recommended there is no pleural effusion pneumothorax. There  is mild cardiomegaly. Atherosclerotic calcification of the aorta. No acute osseous pathology. IMPRESSION: Multifocal pneumonia. Electronically Signed   By: Anner Crete M.D.   On: 11/14/2020 19:39     Assessment and Plan:   COVID-19 associated myocarditis vs stress cardiomyopathy vs incidentally diagnosed chronic cardiomyopathy ACS unlikely, but probably warrants consideration of non-emergent coronary angiography given new LV systolic dysfunction, strongly considering risk of CIN in setting of advanced kidney disease. No echocardiographic evidence of RV strain to suggest PE Appears hypovolemic with high cardiac ouput, well perfused AKI on CKD vs progressive CKD - high threshold to cath  1. ***  {Are we signing off today?:[id number]}  For questions or updates, please contact [facility] Please consult [email or web address] for contact info under     Signed, Marykay Lex, Patterson  11/14/2020 8:06 PM

## 2020-11-14 NOTE — ED Notes (Addendum)
Dr. Sherry Ruffing notified of Trop 181.  Pt going to next treatment room.

## 2020-11-14 NOTE — Consult Note (Signed)
Cardiology Consult    Patient ID: Brenda Patterson MRN: [id number], DOB/AGE: 02/09/1941   Admit date: 11/14/2020 Date of Consult: 11/15/2020  Primary Physician: [name], MD Primary Cardiologist: No primary care provider on file. Requesting Provider: Marda Stalker, MD  Patient Profile    Keven Soucy is a 80 y.o. female with a hx of type 2 DM, CKD, hypertension, depression, and dyslipidemia who is being seen today for the evaluation of a new LBBB on ECG.  History of Present Illness:   Ms. Sweitzer presents with nausea and generalized weakness in the setting of COVID-19 diagnosed on 1/6. Began having nausea, vomiting, fatigue, and diarrhea beginning of January. She declined in-person evaluation by her PCP and has been managed via tele-visits. She has refused SARS-CoV2 vaccination and tested positive for COVID-19 a few days ago. Eventually came in to the ED 1/7, found to be oxygenating well, temp 100.1. Cr was elevated to 3.1 from 2.5 last Sept - she was given IV fluids and discharged from the ED.   Returned to the ED again today for ongoing weakness and severe nausea. She has not had any chest discomfort or shortness of breath at all. Received some initial fluids en route. She has been hypertensive and tachypneic since arrival, temp mildly elevated. ECG obtained on arrival showed a new LBBB compared to most recent ECG from 2018. Again, no chest discomfort. Initial troponin was 181, 2-hour delta troponin increased slightly to 271. Cardiology consulted for further evaluation given new LBBB on ECG. Currently, her only complains continue to be severe nausea and weakness.    Past Medical History   Past Medical History:  Diagnosis Date  . Allergy   . Arthritis   . 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 Diabetes mellitus   . Hyperlipidemia   . Hypertension   . Migraines     Past Surgical History:  Procedure Laterality Date  . CHOLECYSTECTOMY     . TUBAL LIGATION       Allergies  Allergen Reactions  . Azithromycin Nausea Only  . Clarithromycin Other (See Comments)  . Heparin     Patient states it makes her weak  . Penicillins Rash    Rash noted inside her mouth and lips   Inpatient Medications    . albuterol  2 puff Inhalation Q6H  . methylPREDNISolone (SOLU-MEDROL) injection  0.5 mg/kg Intravenous Q12H   Followed by  . [START ON 11/17/2020] predniSONE  50 mg Oral Daily    Family History    Family History  Problem Relation Age of Onset  . Arthritis Other        fhx  . Hyperlipidemia Other        fhx  . Hypertension Other        fhx  . Diabetes Other        fhx  . Stroke Other        fhx  . Breast cancer Sister    She indicated that the status of her sister is unknown.   Social History    Social History   Socioeconomic History  . Marital status: Married    Spouse name: Not on file  . Number of children: Not on file  . Years of education: Not on file  . Highest education level: Not on file  Occupational History  . Not on file  Tobacco Use  . Smoking status: Never Smoker  . Smokeless tobacco: Never Used  Vaping Use  . Vaping Use: Never used  Substance and Sexual Activity  . Alcohol use: No  . Drug use: No  . Sexual activity: Yes  Other Topics Concern  . Not on file  Social History Narrative   Works Arts development officer   Married    Social Determinants of Health   Financial Resource Strain: [place]   . Difficulty of Paying Living Expenses: Not hard at all  Food Insecurity: No Food Insecurity  . Worried About Charity fundraiser in the Last Year: Never true  . Ran Out of Food in the Last Year: Never true  Transportation Needs: No Transportation Needs  . Lack of Transportation (Medical): No  . Lack of Transportation (Non-Medical): No  Physical Activity: Inactive  . Days of Exercise per Week: 0 days  . Minutes of Exercise per Session: 0 min  Stress: No Stress Concern Present  . Feeling  of Stress : Not at all  Social Connections: Moderately Isolated  . Frequency of Communication with Friends and Family: More than three times a week  . Frequency of Social Gatherings with Friends and Family: More than three times a week  . Attends Religious Services: Never  . Active Member of Clubs or Organizations: No  . Attends Archivist Meetings: Never  . Marital Status: Married  Human resources officer Violence: Not At Risk  . Fear of Current or Ex-Partner: No  . Emotionally Abused: No  . Physically Abused: No  . Sexually Abused: No     Review of Systems    A comprehensive review of systems was obtained with pertinent positives and negatives noted in the HPI.  Physical Exam    Blood pressure (!) 125/103, pulse 100, temperature 99 F (37.2 C), temperature source Oral, resp. rate (!) 23, SpO2 95 %.    No intake or output data in the 24 hours ending 11/15/20 0051 Wt Readings from Last 3 Encounters:  11/12/20 87.1 kg  08/10/20 87.7 kg  07/13/20 87.1 kg   CONSTITUTIONAL: alert, calling out for help, in trendelenburg in stretcher, laying lateral recumbent, in distress due to apparent nausea per the patient.  HEENT: dry mucous membranes, conjunctiva normal, EOM intact, pupils equal. NECK: no JVD CARDIOVASCULAR: Regular rhythm. Apical holosystolic murmur. Radial pulses intact. JVP is normal PULMONARY/CHEST WALL: no deformities, normal breath sounds bilaterally, normal work of breathing ABDOMINAL: soft, non-tender, non-distended EXTREMITIES: no edema, warm and well-perfused SKIN: Dry and intact without apparent rashes or wounds.  NEUROLOGIC: alert, no abnormal movements, cranial nerves grossly intact.   Labs    Cardiac Panel (last 3 results) Recent Labs    11/14/20 1703 11/14/20 1922  TROPONINIHS 181* 271*    Lab Results  Component Value Date   WBC 8.2 11/14/2020   HGB 10.4 (L) 11/14/2020   HCT 31.9 (L) 11/14/2020   MCV 91.7 11/14/2020   PLT 123 (L) 11/14/2020     Recent Labs  Lab 11/14/20 1451 11/14/20 1922  NA 127*  --   K 3.5  --   CL 98  --   CO2 18*  --   BUN 35*  --   CREATININE 3.02*  --   CALCIUM 7.8*  --   PROT  --  5.5*  BILITOT  --  0.7  ALKPHOS  --  38  ALT  --  37  AST  --  75*  GLUCOSE 178*  --    Lab Results  Component Value Date   CHOL 193  07/13/2020   HDL 38 (L) 07/13/2020   LDLCALC 116 (H) 07/13/2020   TRIG 274 (H) 07/13/2020     Radiology Studies    DG Chest Portable 1 View  Result Date: 11/14/2020 CLINICAL DATA:  80 year old female with positive COVID-19. EXAM: PORTABLE CHEST 1 VIEW COMPARISON:  Chest radiograph date 06/05/2018 FINDINGS: Bilateral confluent and streaky densities consistent with multifocal pneumonia and in keeping with COVID-19. Clinical correlation is recommended there is no pleural effusion pneumothorax. There is mild cardiomegaly. Atherosclerotic calcification of the aorta. No acute osseous pathology. IMPRESSION: Multifocal pneumonia. Electronically Signed   By: Anner Crete M.D.   On: 11/14/2020 19:39    ECG & Cardiac Imaging    ECG: NSR, LBBB, LAE. Compared to the 2018 ECG, the LBBB is new - personally reviewed.  POC TTE (personally performed): EF 25-30%, basal inferolateral wall only mildly hypokinetic with more severe global hypokinesis. High cardiac output with stroke volume of 83 mL and CO of 7.1 L/min. RV appears under-filled and hyperdynamic Mild-moderate MR IVC collapsed No pericardial effusion   Impression and Recommendations   This is a 80 year old woman with type 2 DM, CKD, hypertension, dyslipidemia, and recently diagnosed COVID-19 with predominant gastrointestinal symptoms, presenting with ongoing nausea and weakness. She was incidentally found to have a LBBB, and has no chest pain or other symptoms suggestive of ACS. The nausea has been ongoing now for over a week. On exam, she is hypovolemic and well-perfused in a high output state, confirmed by echocardiography. Her  last ECG is from 2018, so it is unclear how long the LBBB has been present. In the context of her reduced EF (also a new diagnosis), mild troponin elevation, and COVID-19 infection, these findings may be due to a possible undiagnosed chronic ischemic (or non-ischemic) cardiomyopathy, myocarditis, or stress cardiomyopathy. Given her low-level troponin elevation and lack of chest pain or other acute cardiac symptomatology, ACS seems very unlikely. However, she may warrant consideration of non-emergent coronary angiography once she otherwise improves clinically given new LV systolic dysfunction, though her renal function makes her high risk for CIN.   - Continue to trend troponin every 4 hours to peak - Monitor on telemetry - Obtain complete TTE in the morning - Obtain updated lipid panel and A1c for risk stratification - Would be reasonable to start aspirin 81mg  with full dose aspirin now pending troponin trends - No indication currently to treat for ACS with heparin - We can consider introducing GDMT tomorrow if clinically improved - Should be able to tolerate some fluid if needed - Pending clinical course, we could consider coronary angiography prior to discharge or as outpatient  Signed, Marykay Lex, MD 11/15/2020, 12:51 AM  For questions or updates, please contact   Please consult [email or web address] for contact info under Cardiology/STEMI.

## 2020-11-14 NOTE — ED Notes (Signed)
Brenda Patterson, daughter, [phone] would like an update when available

## 2020-11-15 ENCOUNTER — Observation Stay (HOSPITAL_COMMUNITY): Payer: Medicare Other

## 2020-11-15 ENCOUNTER — Encounter (HOSPITAL_COMMUNITY): Payer: Self-pay | Admitting: Internal Medicine

## 2020-11-15 DIAGNOSIS — I361 Nonrheumatic tricuspid (valve) insufficiency: Secondary | ICD-10-CM

## 2020-11-15 DIAGNOSIS — N179 Acute kidney failure, unspecified: Secondary | ICD-10-CM

## 2020-11-15 DIAGNOSIS — F32A Depression, unspecified: Secondary | ICD-10-CM | POA: Diagnosis present

## 2020-11-15 DIAGNOSIS — Z8249 Family history of ischemic heart disease and other diseases of the circulatory system: Secondary | ICD-10-CM | POA: Diagnosis not present

## 2020-11-15 DIAGNOSIS — U071 COVID-19: Secondary | ICD-10-CM | POA: Diagnosis not present

## 2020-11-15 DIAGNOSIS — Z880 Allergy status to penicillin: Secondary | ICD-10-CM | POA: Diagnosis not present

## 2020-11-15 DIAGNOSIS — I5021 Acute systolic (congestive) heart failure: Secondary | ICD-10-CM

## 2020-11-15 DIAGNOSIS — E871 Hypo-osmolality and hyponatremia: Secondary | ICD-10-CM | POA: Diagnosis present

## 2020-11-15 DIAGNOSIS — E785 Hyperlipidemia, unspecified: Secondary | ICD-10-CM | POA: Diagnosis present

## 2020-11-15 DIAGNOSIS — I340 Nonrheumatic mitral (valve) insufficiency: Secondary | ICD-10-CM

## 2020-11-15 DIAGNOSIS — R079 Chest pain, unspecified: Secondary | ICD-10-CM

## 2020-11-15 DIAGNOSIS — I447 Left bundle-branch block, unspecified: Secondary | ICD-10-CM

## 2020-11-15 DIAGNOSIS — E872 Acidosis: Secondary | ICD-10-CM | POA: Diagnosis present

## 2020-11-15 DIAGNOSIS — E860 Dehydration: Secondary | ICD-10-CM | POA: Diagnosis present

## 2020-11-15 DIAGNOSIS — R778 Other specified abnormalities of plasma proteins: Secondary | ICD-10-CM

## 2020-11-15 DIAGNOSIS — Z7984 Long term (current) use of oral hypoglycemic drugs: Secondary | ICD-10-CM | POA: Diagnosis not present

## 2020-11-15 DIAGNOSIS — R531 Weakness: Secondary | ICD-10-CM | POA: Diagnosis present

## 2020-11-15 DIAGNOSIS — Z833 Family history of diabetes mellitus: Secondary | ICD-10-CM | POA: Diagnosis not present

## 2020-11-15 DIAGNOSIS — R9431 Abnormal electrocardiogram [ECG] [EKG]: Secondary | ICD-10-CM | POA: Diagnosis present

## 2020-11-15 DIAGNOSIS — D631 Anemia in chronic kidney disease: Secondary | ICD-10-CM | POA: Diagnosis present

## 2020-11-15 DIAGNOSIS — N1831 Chronic kidney disease, stage 3a: Secondary | ICD-10-CM | POA: Diagnosis present

## 2020-11-15 DIAGNOSIS — Z881 Allergy status to other antibiotic agents status: Secondary | ICD-10-CM | POA: Diagnosis not present

## 2020-11-15 DIAGNOSIS — E1122 Type 2 diabetes mellitus with diabetic chronic kidney disease: Secondary | ICD-10-CM | POA: Diagnosis present

## 2020-11-15 DIAGNOSIS — E669 Obesity, unspecified: Secondary | ICD-10-CM | POA: Diagnosis present

## 2020-11-15 DIAGNOSIS — I130 Hypertensive heart and chronic kidney disease with heart failure and stage 1 through stage 4 chronic kidney disease, or unspecified chronic kidney disease: Secondary | ICD-10-CM | POA: Diagnosis present

## 2020-11-15 DIAGNOSIS — E039 Hypothyroidism, unspecified: Secondary | ICD-10-CM | POA: Diagnosis present

## 2020-11-15 DIAGNOSIS — Z6835 Body mass index (BMI) 35.0-35.9, adult: Secondary | ICD-10-CM | POA: Diagnosis not present

## 2020-11-15 DIAGNOSIS — Z888 Allergy status to other drugs, medicaments and biological substances status: Secondary | ICD-10-CM | POA: Diagnosis not present

## 2020-11-15 DIAGNOSIS — M6282 Rhabdomyolysis: Secondary | ICD-10-CM | POA: Diagnosis present

## 2020-11-15 DIAGNOSIS — I5043 Acute on chronic combined systolic (congestive) and diastolic (congestive) heart failure: Secondary | ICD-10-CM | POA: Diagnosis present

## 2020-11-15 DIAGNOSIS — J1282 Pneumonia due to coronavirus disease 2019: Secondary | ICD-10-CM | POA: Diagnosis present

## 2020-11-15 HISTORY — DX: Left bundle-branch block, unspecified: I44.7

## 2020-11-15 LAB — SODIUM, URINE, RANDOM: Sodium, Ur: 64 mmol/L

## 2020-11-15 LAB — ECHOCARDIOGRAM COMPLETE
Area-P 1/2: 5.38 cm2
Calc EF: 20.4 %
MV M vel: 5.66 m/s
MV Peak grad: 128.1 mmHg
Radius: 0.5 cm
S' Lateral: 4.7 cm
Single Plane A2C EF: 29.3 %
Single Plane A4C EF: 9.8 %

## 2020-11-15 LAB — RETICULOCYTES
Immature Retic Fract: 10.4 % (ref 2.3–15.9)
RBC.: 3.33 MIL/uL — ABNORMAL LOW (ref 3.87–5.11)
Retic Count, Absolute: 26 10*3/uL (ref 19.0–186.0)
Retic Ct Pct: 0.8 % (ref 0.4–3.1)

## 2020-11-15 LAB — FERRITIN
Ferritin: 371 ng/mL — ABNORMAL HIGH (ref 11–307)
Ferritin: 372 ng/mL — ABNORMAL HIGH (ref 11–307)

## 2020-11-15 LAB — IRON AND TIBC
Iron: 9 ug/dL — ABNORMAL LOW (ref 28–170)
Saturation Ratios: 5 % — ABNORMAL LOW (ref 10.4–31.8)
TIBC: 186 ug/dL — ABNORMAL LOW (ref 250–450)
UIBC: 177 ug/dL

## 2020-11-15 LAB — CBG MONITORING, ED
Glucose-Capillary: 189 mg/dL — ABNORMAL HIGH (ref 70–99)
Glucose-Capillary: 214 mg/dL — ABNORMAL HIGH (ref 70–99)
Glucose-Capillary: 260 mg/dL — ABNORMAL HIGH (ref 70–99)
Glucose-Capillary: 189 mg/dL — ABNORMAL HIGH (ref 70–99)
Glucose-Capillary: 184 mg/dL — ABNORMAL HIGH (ref 70–99)

## 2020-11-15 LAB — FIBRINOGEN: Fibrinogen: 435 mg/dL (ref 210–475)

## 2020-11-15 LAB — TROPONIN I (HIGH SENSITIVITY)
Troponin I (High Sensitivity): 262 ng/L (ref <18)
Troponin I (High Sensitivity): 1342 ng/L (ref <18)

## 2020-11-15 LAB — PROCALCITONIN: Procalcitonin: 0.16 ng/mL

## 2020-11-15 LAB — FOLATE: Folate: 27.4 ng/mL (ref >5.9)

## 2020-11-15 LAB — D-DIMER, QUANTITATIVE: D-Dimer, Quant: 2.75 ug/mL-FEU — ABNORMAL HIGH (ref 0.00–0.50)

## 2020-11-15 LAB — VITAMIN B12: Vitamin B-12: 1458 pg/mL — ABNORMAL HIGH (ref 180–914)

## 2020-11-15 LAB — RESP PANEL BY RT-PCR (FLU A&B, COVID) ARPGX2
Influenza A by PCR: NEGATIVE
Influenza B by PCR: NEGATIVE
SARS Coronavirus 2 by RT PCR: POSITIVE — AB

## 2020-11-15 LAB — HEMOGLOBIN A1C
Hgb A1c MFr Bld: 6.2 % — ABNORMAL HIGH (ref 4.8–5.6)
Mean Plasma Glucose: 131.24 mg/dL

## 2020-11-15 LAB — CK
Total CK: 1211 U/L — ABNORMAL HIGH (ref 38–234)
Total CK: 1191 U/L — ABNORMAL HIGH (ref 38–234)

## 2020-11-15 LAB — LACTATE DEHYDROGENASE: LDH: 296 U/L — ABNORMAL HIGH (ref 98–192)

## 2020-11-15 LAB — C-REACTIVE PROTEIN: CRP: 5.2 mg/dL — ABNORMAL HIGH (ref <1.0)

## 2020-11-15 LAB — CREATININE, URINE, RANDOM: Creatinine, Urine: 138.38 mg/dL

## 2020-11-15 MED ORDER — ACETAMINOPHEN 325 MG PO TABS: 650.0000 mg | ORAL_TABLET | Freq: Four times a day (QID) | ORAL | Status: DC | PRN

## 2020-11-15 MED ORDER — HYDROCODONE-ACETAMINOPHEN 5-325 MG PO TABS
1.0000 | ORAL_TABLET | ORAL | Status: DC | PRN
  Administered 2020-11-19: 1 via ORAL
  Filled 2020-11-15: qty 1

## 2020-11-15 MED ORDER — ALBUTEROL SULFATE HFA 108 (90 BASE) MCG/ACT IN AERS
2.0000 | INHALATION_SPRAY | Freq: Four times a day (QID) | RESPIRATORY_TRACT | Status: DC | PRN
  Administered 2020-11-16: 2 via RESPIRATORY_TRACT
  Filled 2020-11-15: qty 6.7

## 2020-11-15 MED ORDER — ROSUVASTATIN CALCIUM 20 MG PO TABS
20.0000 mg | ORAL_TABLET | Freq: Every day | ORAL | Status: DC
  Administered 2020-11-15: 20 mg via ORAL
  Filled 2020-11-15: qty 1
  Filled 2020-11-15: qty 4

## 2020-11-15 MED ORDER — LEVOTHYROXINE SODIUM 25 MCG PO TABS
25.0000 ug | ORAL_TABLET | Freq: Every day | ORAL | Status: DC
  Administered 2020-11-16 – 2020-11-20 (×5): 25 ug via ORAL
  Filled 2020-11-15 (×5): qty 1

## 2020-11-15 MED ORDER — GUAIFENESIN-DM 100-10 MG/5ML PO SYRP: 10.0000 mL | ORAL_SOLUTION | ORAL | Status: DC | PRN

## 2020-11-15 MED ORDER — SODIUM CHLORIDE 0.9% FLUSH
3.0000 mL | Freq: Two times a day (BID) | INTRAVENOUS | Status: DC
  Administered 2020-11-15 – 2020-11-20 (×10): 3 mL via INTRAVENOUS

## 2020-11-15 MED ORDER — SODIUM CHLORIDE 0.9 % IV SOLN: INTRAVENOUS | Status: AC

## 2020-11-15 MED ORDER — ASPIRIN EC 81 MG PO TBEC
81.0000 mg | DELAYED_RELEASE_TABLET | Freq: Every day | ORAL | Status: DC
  Administered 2020-11-15 – 2020-11-20 (×6): 81 mg via ORAL
  Filled 2020-11-15 (×6): qty 1

## 2020-11-15 MED ORDER — PERFLUTREN LIPID MICROSPHERE
1.0000 mL | INTRAVENOUS | Status: AC | PRN
  Administered 2020-11-15: 2 mL via INTRAVENOUS
  Filled 2020-11-15: qty 10

## 2020-11-15 MED ORDER — INSULIN ASPART 100 UNIT/ML ~~LOC~~ SOLN
0.0000 [IU] | SUBCUTANEOUS | Status: DC
  Administered 2020-11-15: 3 [IU] via SUBCUTANEOUS
  Administered 2020-11-15: 2 [IU] via SUBCUTANEOUS
  Administered 2020-11-15: 5 [IU] via SUBCUTANEOUS
  Administered 2020-11-15: 2 [IU] via SUBCUTANEOUS
  Administered 2020-11-16: 3 [IU] via SUBCUTANEOUS
  Administered 2020-11-16: 2 [IU] via SUBCUTANEOUS
  Administered 2020-11-16 – 2020-11-17 (×3): 3 [IU] via SUBCUTANEOUS
  Administered 2020-11-17 (×3): 2 [IU] via SUBCUTANEOUS
  Administered 2020-11-17: 3 [IU] via SUBCUTANEOUS
  Administered 2020-11-17: 2 [IU] via SUBCUTANEOUS
  Administered 2020-11-18 (×2): 5 [IU] via SUBCUTANEOUS
  Administered 2020-11-18 (×2): 2 [IU] via SUBCUTANEOUS
  Administered 2020-11-18: 3 [IU] via SUBCUTANEOUS
  Administered 2020-11-18: 2 [IU] via SUBCUTANEOUS
  Administered 2020-11-19: 3 [IU] via SUBCUTANEOUS
  Administered 2020-11-19: 2 [IU] via SUBCUTANEOUS
  Administered 2020-11-19: 3 [IU] via SUBCUTANEOUS
  Administered 2020-11-19: 1 [IU] via SUBCUTANEOUS
  Administered 2020-11-19 (×2): 2 [IU] via SUBCUTANEOUS
  Administered 2020-11-19: 3 [IU] via SUBCUTANEOUS
  Administered 2020-11-20 (×2): 2 [IU] via SUBCUTANEOUS

## 2020-11-15 MED ORDER — ONDANSETRON HCL 4 MG/2ML IJ SOLN
4.0000 mg | Freq: Four times a day (QID) | INTRAMUSCULAR | Status: DC | PRN
  Administered 2020-11-18 – 2020-11-19 (×3): 4 mg via INTRAVENOUS
  Filled 2020-11-15 (×3): qty 2

## 2020-11-15 MED ORDER — ONDANSETRON HCL 4 MG PO TABS
4.0000 mg | ORAL_TABLET | Freq: Four times a day (QID) | ORAL | Status: DC | PRN
  Filled 2020-11-15: qty 1

## 2020-11-15 MED ORDER — SERTRALINE HCL 25 MG PO TABS
50.0000 mg | ORAL_TABLET | Freq: Every day | ORAL | Status: DC
  Administered 2020-11-15 – 2020-11-20 (×6): 50 mg via ORAL
  Filled 2020-11-15 (×2): qty 1
  Filled 2020-11-15 (×4): qty 2

## 2020-11-15 NOTE — ED Notes (Signed)
Dinner Trays Ordered @ [phone].

## 2020-11-15 NOTE — Progress Notes (Signed)
  Echocardiogram 2D Echocardiogram has been performed.  Brenda Patterson 11/15/2020, 1:13 PM

## 2020-11-15 NOTE — ED Notes (Signed)
Echo at bedside

## 2020-11-15 NOTE — Progress Notes (Signed)
PROGRESS NOTE  Kelbie Moro FAO:[id number] DOB: August 26, 1941 DOA: 11/14/2020 PCP: [name], MD  Brief History   80 year old woman PMH CKD, diabetes mellitus presented with nausea, vomiting, diarrhea for 1 week, symptomatic from Covid test + January 2 but no respiratory symptoms.  Admitted for multifocal pneumonia secondary to COVID-19 with associated nausea vomiting and diarrhea, dehydration, rhabdomyolysis, elevated troponin, new left bundle branch block and wall motion abnormalities concerning for myocarditis or ischemia  Elevated troponin with new left bundle branch block and reduced EF new diagnosis --asymptomatic, no hypoxia, hemodynamic stable --Concern for Covid associated myocarditis versus chronic ischemic cardiomyopathy versus stress cardiomyopathy. --Follow-up TTE, continue aspirin  COVID-19 multifocal pneumonia with associated nausea, vomiting diarrhea --No hypoxia or respiratory symptoms.  Continue treatment as below.  . CXR on admit: multifocal pneumonia . Fever: none . Oxygen requirement: none . Antibiotics: none, procalcitonin .16  . Remdesivir: 1/9 > . Steroids: 1/9 >  . Actemra/baricitinib: not indicated . Vitamin C and Zinc . Proning: recommended   Inflammatory markers: . CRP: 5.2 >  . D-dimer: 2.75 >  . Ferritin 372 >  Elevated CK --no hx to suggest rhabdo, suspect elevated secondary to COVID --CK still high; continue IVF but carefully monitor intake and output and volume status, trend CK  AKI superimposed on CKD stage IIIa --Likely from nausea vomiting and diarrhea, no hypotension documented  Covid may be contributing. --Gentle IV fluids, monitor volume status closely, strict I and O, urine studies, hold potential nephrotoxins, fails to improve we will proceed with renal ultrasound  Hyponatremia, low CO2 likely secondary to GI losses --Asymptomatic.  IV fluids gently  Diabetes mellitus type 2 --CBG 200s  Anemia of chronic  disease --Stable  Disposition Plan:  Discussion: as above  Dispo: The patient is from: Home              Anticipated d/c is to: Home              Anticipated d/c date is: 3 days              Patient currently is not medically stable to d/c.  DVT prophylaxis: SCDs Start: 11/15/20 6962   Code Status: Full Code Family Communication: none  Murray Hodgkins, MD  [facility] Direct contact: see [email or web address] (further directions at bottom of note if needed) 7PM-7AM contact night coverage as at bottom of note 11/15/2020, 11:21 AM  LOS: 0 days   Significant Hospital Events   .    Consults:  .    Procedures:  .   Significant Diagnostic Tests:  Marland Kitchen    Micro Data:  .    Antimicrobials:  .   Interval History/Subjective  CC: f/u covid  Feels much better Breathing fine No chest pain No complaints  Objective   Vitals:  Vitals:   11/15/20 1015 11/15/20 1100  BP: (!) 143/65 (!) 127/109  Pulse: 71 82  Resp: (!) 28 17  Temp:    SpO2: 98% 97%    Exam:  Constitutional:   . Appears calm and comfortable in ED, well-appearing ENMT:  . grossly normal hearing  Respiratory:  . CTA bilaterally, no w/r/r.  . Respiratory effort normal.  Cardiovascular:  . RRR, no m/r/g . No LE extremity edema   Abdomen:  . Soft, ntnd Musculoskeletal:  . RUE, LUE, RLE, LLE   . strength and tone grossly normal Psychiatric:  . Mental status o Mood, affect appropriate  I have personally reviewed the following:   Today's  Data  . CBG up to 200s . Creatinine stable at 3.02, sodium down to 127, CO2 down to 18 consistent with GI losses.  AST elevated at 75. . Troponin trending up, 1342 . CK elevated 1191 consistent with rhabdomyolysis  Scheduled Meds: . albuterol  2 puff Inhalation Q6H  . aspirin EC  81 mg Oral Daily  . insulin aspart  0-9 Units Subcutaneous Q4H  . [START ON 11/16/2020] levothyroxine  25 mcg Oral QAC breakfast  . methylPREDNISolone (SOLU-MEDROL) injection  0.5 mg/kg  Intravenous Q12H   Followed by  . [START ON 11/17/2020] predniSONE  50 mg Oral Daily  . rosuvastatin  20 mg Oral Daily  . sertraline  50 mg Oral Daily  . sodium chloride flush  3 mL Intravenous Q12H   Continuous Infusions: . sodium chloride 100 mL/hr at 11/15/20 0839  . remdesivir 100 mg in NS 100 mL Stopped (11/15/20 1000)    Principal Problem:   Pneumonia due to COVID-19 virus Active Problems:   Type 2 diabetes mellitus, controlled ([place])   Hyperlipidemia   Essential hypertension   Obesity (BMI 30-39.9)   Nausea vomiting and diarrhea   Anemia of chronic disease   CKD (chronic kidney disease) stage 3, GFR 30-59 ml/min (HCC)   Elevated troponin   Abnormal ECG   AKI (acute kidney injury) (HCC)   LBBB (left bundle branch block)   LOS: 0 days   How to contact the [facility] Attending or Consulting provider [place] or covering provider during after hours [place], for this patient?  1. Check the care team in [facility] and look for a) attending/consulting [facility] provider listed and b) the [facility] team listed 2. Log into [email or web address] and use [place]'s universal password to access. If you do not have the password, please contact the hospital operator. 3. Locate the [facility] provider you are looking for under [facility] and page to a number that you can be directly reached. 4. If you still have difficulty reaching the provider, please page the [facility] (Director on Call) for the Hospitalists listed on amion for assistance.

## 2020-11-15 NOTE — ED Notes (Incomplete)
Pt spilled her ice

## 2020-11-15 NOTE — ED Notes (Signed)
Pt no longer vomiting wants ice chgips given

## 2020-11-15 NOTE — ED Notes (Signed)
Lunch Tray Ordered 1115. 

## 2020-11-15 NOTE — Progress Notes (Addendum)
Progress Note  Patient Name: Brenda Patterson Date of Encounter: 11/15/2020  [facility] Cardiologist: No primary care provider on file. [place]- new  Visit done over the phone due to her Covid infection and in an attempt to minimize exposure and preserve PPE.  Subjective   Feels much better after hydration.  No chest pain.  No shortness of breath.  Inpatient Medications    Scheduled Meds: . albuterol  2 puff Inhalation Q6H  . aspirin EC  81 mg Oral Daily  . insulin aspart  0-9 Units Subcutaneous Q4H  . [START ON 11/16/2020] levothyroxine  25 mcg Oral QAC breakfast  . methylPREDNISolone (SOLU-MEDROL) injection  0.5 mg/kg Intravenous Q12H   Followed by  . [START ON 11/17/2020] predniSONE  50 mg Oral Daily  . rosuvastatin  20 mg Oral Daily  . sertraline  50 mg Oral Daily  . sodium chloride flush  3 mL Intravenous Q12H   Continuous Infusions: . remdesivir 100 mg in NS 100 mL Stopped (11/15/20 1000)   PRN Meds: acetaminophen, albuterol, guaiFENesin-dextromethorphan, HYDROcodone-acetaminophen, ondansetron **OR** ondansetron (ZOFRAN) IV   Vital Signs    Vitals:   11/15/20 1345 11/15/20 1430 11/15/20 1500 11/15/20 1900  BP:  (!) 149/77 (!) 169/66 (!) 158/71  Pulse: 66 67  79  Resp: 20 17 15 14   Temp:      TempSrc:      SpO2: 100% 100%  93%    Intake/Output Summary (Last 24 hours) at 11/15/2020 2025 Last data filed at 11/15/2020 1000 Gross per 24 hour  Intake 100 ml  Output --  Net 100 ml   Last 3 Weights 11/12/2020 08/10/2020 07/20/2020  Weight (lbs) 192 lb 193 lb 7 oz (No Data)  Weight (kg) 87.091 kg 87.743 kg (No Data)      Telemetry    NSR, LBBB - Personally Reviewed  ECG    NSR, LBBB - Personally Reviewed  Physical Exam   GEN: No acute distress.  Lying flat speaking on the phone.  I could see her through the window on the door to her room.   Respiratory: No SHOB  Neuro:  Nonfocal  Psych: Normal affect   Limited by phone format  Labs     High Sensitivity Troponin:   Recent Labs  Lab 11/14/20 1703 11/14/20 1922 11/14/20 2344 11/15/20 0249  TROPONINIHS 181* 271* 262* 1,342*      Chemistry Recent Labs  Lab 11/12/20 2120 11/14/20 1451 11/14/20 1922  NA 131* 127*  --   K 3.6 3.5  --   CL 98 98  --   CO2 21* 18*  --   GLUCOSE 167* 178*  --   BUN 42* 35*  --   CREATININE 3.09* 3.02*  --   CALCIUM 8.6* 7.8*  --   PROT 6.8  --  5.5*  ALBUMIN 3.4*  --  2.7*  AST 41  --  75*  ALT 29  --  37  ALKPHOS 55  --  38  BILITOT 0.6  --  0.7  GFRNONAA 15* 15*  --   ANIONGAP 12 11  --      Hematology Recent Labs  Lab 11/12/20 2120 11/14/20 1451 11/15/20 0249  WBC 6.6 8.2  --   RBC 3.80* 3.48* 3.33*  HGB 11.8* 10.4*  --   HCT 34.6* 31.9*  --   MCV 91.1 91.7  --   MCH 31.1 29.9  --   MCHC 34.1 32.6  --   RDW 12.3  12.1  --   PLT 154 123*  --     BNPNo results for input(s): BNP, PROBNP in the last 168 hours.   DDimer  Recent Labs  Lab 11/15/20 0249  DDIMER 2.75*     Radiology    DG Chest Portable 1 View  Result Date: 11/14/2020 CLINICAL DATA:  80 year old female with positive COVID-19. EXAM: PORTABLE CHEST 1 VIEW COMPARISON:  Chest radiograph date 06/05/2018 FINDINGS: Bilateral confluent and streaky densities consistent with multifocal pneumonia and in keeping with COVID-19. Clinical correlation is recommended there is no pleural effusion pneumothorax. There is mild cardiomegaly. Atherosclerotic calcification of the aorta. No acute osseous pathology. IMPRESSION: Multifocal pneumonia. Electronically Signed   By: Anner Crete M.D.   On: 11/14/2020 19:39   ECHOCARDIOGRAM COMPLETE  Result Date: 11/15/2020    ECHOCARDIOGRAM REPORT   Patient Name:   Brenda Patterson Date of Exam: 11/15/2020 Medical Rec #:  [id number]             Height:       62.0 in Accession #:    [id number]            Weight:       192.0 lb Date of Birth:  November 11, 1940              BSA:          1.879 m Patient Age:    80 years               BP:           122/42 mmHg Patient Gender: F                     HR:           72 bpm. Exam Location:  Inpatient Procedure: 2D Echo, Cardiac Doppler, Color Doppler and Intracardiac            Opacification Agent Indications:    R07.9* Chest pain, unspecified  History:        Patient has prior history of Echocardiogram examinations, most                 recent 12/02/2012. Abnormal ECG, Arrythmias:RBBB; Risk                 Factors:Diabetes, Dyslipidemia and Hypertension. Covid positive.                 Elevated troponin. Pneumonia.  Sonographer:    Roseanna Rainbow RDCS Referring Phys: [place]  Sonographer Comments: Technically difficult study due to poor echo windows and patient is morbidly obese. Image acquisition challenging due to patient body habitus and Image acquisition challenging due to uncooperative patient. Had to pause exam due to patient throwing off clothes and nearly grabbing me, and rolling out of bed. Patient calmed down, and I continued the study. IMPRESSIONS  1. Left ventricular ejection fraction, by estimation, is 20 to 25%. The left ventricle has severely decreased function. The left ventricle demonstrates regional wall motion abnormalities (see scoring diagram/findings for description). There is severe concentric left ventricular hypertrophy. Left ventricular diastolic parameters are indeterminate. Elevated left ventricular end-diastolic pressure. There is akinesis of the left ventricular, basal-mid inferoseptal wall and anteroseptal wall. There is akinesis of the left ventricular, apical septal wall, inferolateral wall and anterior wall. There is akinesis of the left ventricular, entire inferior wall and apical segment.  2. Right ventricular systolic function is normal. The right ventricular size is normal. There  is moderately elevated pulmonary artery systolic pressure. The estimated right ventricular systolic pressure is 19.6 mmHg.  3. The mitral valve is degenerative. Mild to  moderate mitral valve regurgitation. No evidence of mitral stenosis. Moderate mitral annular calcification.  4. The aortic valve is normal in structure. Aortic valve regurgitation is not visualized. No aortic stenosis is present.  5. The inferior vena cava is dilated in size with <50% respiratory variability, suggesting right atrial pressure of 15 mmHg. FINDINGS  Left Ventricle: Left ventricular ejection fraction, by estimation, is 20 to 25%. The left ventricle has severely decreased function. The left ventricle demonstrates regional wall motion abnormalities. Definity contrast agent was given IV to delineate the left ventricular endocardial borders. The left ventricular internal cavity size was normal in size. There is severe concentric left ventricular hypertrophy. Abnormal (paradoxical) septal motion, consistent with left bundle branch block. Left ventricular diastolic parameters are indeterminate. Elevated left ventricular end-diastolic pressure. Right Ventricle: The right ventricular size is normal. No increase in right ventricular wall thickness. Right ventricular systolic function is normal. There is moderately elevated pulmonary artery systolic pressure. The tricuspid regurgitant velocity is 3.14 m/s, and with an assumed right atrial pressure of 15 mmHg, the estimated right ventricular systolic pressure is 22.2 mmHg. Left Atrium: Left atrial size was normal in size. Right Atrium: Right atrial size was normal in size. Pericardium: There is no evidence of pericardial effusion. Mitral Valve: The mitral valve is degenerative in appearance. There is mild thickening of the mitral valve leaflet(s). Moderate mitral annular calcification. Mild to moderate mitral valve regurgitation. No evidence of mitral valve stenosis. MV peak gradient, 13.0 mmHg. The mean mitral valve gradient is 3.0 mmHg. Tricuspid Valve: The tricuspid valve is normal in structure. Tricuspid valve regurgitation is mild . No evidence of tricuspid  stenosis. Aortic Valve: The aortic valve is normal in structure. Aortic valve regurgitation is not visualized. No aortic stenosis is present. Pulmonic Valve: The pulmonic valve was normal in structure. Pulmonic valve regurgitation is not visualized. No evidence of pulmonic stenosis. Aorta: The aortic root is normal in size and structure. Venous: The inferior vena cava is dilated in size with less than 50% respiratory variability, suggesting right atrial pressure of 15 mmHg. IAS/Shunts: The interatrial septum appears to be lipomatous. No atrial level shunt detected by color flow Doppler.  LEFT VENTRICLE PLAX 2D LVIDd:         4.90 cm      Diastology LVIDs:         4.70 cm      LV e' medial:    3.05 cm/s LV PW:         2.10 cm      LV E/e' medial:  47.2 LV IVS:        1.70 cm      LV e' lateral:   4.90 cm/s LVOT diam:     2.20 cm      LV E/e' lateral: 29.4 LV SV:         74 LV SV Index:   39 LVOT Area:     3.80 cm  LV Volumes (MOD) LV vol d, MOD A2C: 121.0 ml LV vol d, MOD A4C: 133.0 ml LV vol s, MOD A2C: 85.5 ml LV vol s, MOD A4C: 120.0 ml LV SV MOD A2C:     35.5 ml LV SV MOD A4C:     133.0 ml LV SV MOD BP:      25.8 ml RIGHT VENTRICLE  IVC RV S prime:     15.00 cm/s  IVC diam: 2.10 cm TAPSE (M-mode): 1.9 cm LEFT ATRIUM             Index       RIGHT ATRIUM           Index LA diam:        4.00 cm 2.13 cm/m  RA Area:     10.40 cm LA Vol (A2C):   49.5 ml 26.35 ml/m RA Volume:   22.40 ml  11.92 ml/m LA Vol (A4C):   51.0 ml 27.14 ml/m LA Biplane Vol: 52.3 ml 27.84 ml/m  AORTIC VALVE LVOT Vmax:   98.90 cm/s LVOT Vmean:  61.100 cm/s LVOT VTI:    0.195 m  AORTA Ao Root diam: 3.30 cm Ao Asc diam:  3.00 cm MITRAL VALVE                 TRICUSPID VALVE MV Area (PHT): 5.38 cm      TR Peak grad:   39.4 mmHg MV Peak grad:  13.0 mmHg     TR Vmax:        314.00 cm/s MV Mean grad:  3.0 mmHg MV Vmax:       1.80 m/s      SHUNTS MV Vmean:      66.7 cm/s     Systemic VTI:  0.20 m MV Decel Time: 141 msec      Systemic  Diam: 2.20 cm MR Peak grad:    128.1 mmHg MR Mean grad:    99.0 mmHg MR Vmax:         566.00 cm/s MR Vmean:        479.0 cm/s MR PISA:         1.57 cm MR PISA Eff ROA: 11 mm MR PISA Radius:  0.50 cm MV E velocity: 144.00 cm/s Fransico Him MD Electronically signed by Fransico Him MD Signature Date/Time: 11/15/2020/1:21:39 PM    Final     Cardiac Studies   Echo -personally reviewed.  Severe LV dysfunction EF 20-25%.  Normal EF in 2014.  Patient Profile     80 y.o. female with Covid pneumonia. Acute systolic heart failure.  LV dysfunction noted on echocardiogram.  Acute renal failure as well.  Assessment & Plan    Cardiac: Not a candidate for cardiac cath due to her acute renal failure.  She came in very dehydrated.  She has received IV fluids.  She is not feeling short of breath.  Would give her IV fluids as she tolerates but if she develops shortness of breath, this could be a sign of volume overload in the setting of low EF.  Not a candidate for ACE inhibitor.  Could consider beta-blocker, hydralazine and nitrates for her LV dysfunction.  Elevated troponin: May be myopericarditis in the setting of Covid.  Based on her symptoms, it does not appear to be acute coronary syndrome.  Acute renal failure: Hopefully, will improve with hydration.  Covid pneumonia: Per primary team.     For questions or updates, please contact [facility] Please consult [email or web address] for contact info under        Signed, Larae Grooms, MD  11/15/2020, 8:25 PM

## 2020-11-15 NOTE — ED Notes (Signed)
Respiratory swab  Sent  To main lab

## 2020-11-15 NOTE — ED Notes (Signed)
Tele  Breakfast Ordered 

## 2020-11-15 NOTE — Hospital Course (Addendum)
80 year old woman PMH CKD, diabetes mellitus presented with nausea, vomiting, diarrhea for 1 week, symptomatic from Covid test + January 2 but no respiratory symptoms.  Admitted for multifocal pneumonia secondary to COVID-19 with associated nausea vomiting and diarrhea, dehydration, rhabdomyolysis, elevated troponin, new left bundle branch block and wall motion abnormalities concerning for myocarditis or ischemia.  Seen by cardiology not a candidate for cath secondary to renal insufficiency.  Renal function failed to improve with fluids, nephrology consulted.  Elevated troponin with new left bundle branch block, new left ventricular systolic dysfunction --remains asymptomatic, no hypoxia.  Differential includes myopericarditis in the setting of COVID.  ACS doubted. --Continue management per cardiology.  Beta-blocker, consider hydralazine and nitrates.  Not a candidate for ACE inhibitor secondary to renal insufficiency. --No evidence of volume overload.  Gentle IV fluids today.  COVID-19 multifocal pneumonia with associated nausea, vomiting diarrhea -- Remains in symptomatic despite chest x-ray findings, no hypoxia.  Inflammatory markers without significant change today.  CXR on admit: multifocal pneumonia Fever: none Oxygen requirement: none Antibiotics: none, procalcitonin .16  Remdesivir: 1/9 > Steroids: 1/9 >  Actemra/baricitinib: not indicated Vitamin C and Zinc Proning: recommended   Inflammatory markers: CRP: 5.2 > 5.4 D-dimer: 2.75 > 2.3  Elevated CK --no hx to suggest rhabdo, suspect elevated secondary to COVID, however patient was on statin which has been discontinued. --CK remains high, continue IV fluids, trend CK  AKI superimposed on CKD stage IIIa -- Thought secondary to nausea vomiting and diarrhea.  FENa suggests intrinsic, consider ATN from dehydration.  May be secondary to COVID. -- Listed medications at home include chlorthalidone, hydrochlorothiazide -- Resume gentle  IV fluids, continue to monitor volume status closely, strict I and O, urine studies, hold potential nephrotoxins, -- Renal ultrasound nonacute.  Nephrology consulted for further recommendations.  Hyponatremia, low CO2 likely secondary to GI losses -- Modest, asymptomatic.  Was on Zoloft at home.  Diabetes mellitus type 2 --CBG remains stable.  Glipizide on hold.  Continue sliding scale insulin.  Anemia of chronic disease --Stable

## 2020-11-15 NOTE — ED Notes (Signed)
Pt resting.

## 2020-11-16 ENCOUNTER — Inpatient Hospital Stay (HOSPITAL_COMMUNITY): Payer: Medicare Other

## 2020-11-16 DIAGNOSIS — U071 COVID-19: Secondary | ICD-10-CM | POA: Diagnosis not present

## 2020-11-16 DIAGNOSIS — R778 Other specified abnormalities of plasma proteins: Secondary | ICD-10-CM | POA: Diagnosis not present

## 2020-11-16 DIAGNOSIS — I519 Heart disease, unspecified: Secondary | ICD-10-CM

## 2020-11-16 DIAGNOSIS — N1831 Chronic kidney disease, stage 3a: Secondary | ICD-10-CM | POA: Diagnosis not present

## 2020-11-16 DIAGNOSIS — I5021 Acute systolic (congestive) heart failure: Secondary | ICD-10-CM | POA: Diagnosis not present

## 2020-11-16 DIAGNOSIS — N179 Acute kidney failure, unspecified: Secondary | ICD-10-CM | POA: Diagnosis not present

## 2020-11-16 LAB — CBC WITH DIFFERENTIAL/PLATELET
Abs Immature Granulocytes: 0.12 10*3/uL — ABNORMAL HIGH (ref 0.00–0.07)
Basophils Absolute: 0 10*3/uL (ref 0.0–0.1)
Basophils Relative: 0 %
Eosinophils Absolute: 0 10*3/uL (ref 0.0–0.5)
Eosinophils Relative: 0 %
HCT: 30.2 % — ABNORMAL LOW (ref 36.0–46.0)
Hemoglobin: 10.4 g/dL — ABNORMAL LOW (ref 12.0–15.0)
Immature Granulocytes: 1 %
Lymphocytes Relative: 10 %
Lymphs Abs: 1.4 10*3/uL (ref 0.7–4.0)
MCH: 30.4 pg (ref 26.0–34.0)
MCHC: 34.4 g/dL (ref 30.0–36.0)
MCV: 88.3 fL (ref 80.0–100.0)
Monocytes Absolute: 0.4 10*3/uL (ref 0.1–1.0)
Monocytes Relative: 3 %
Neutro Abs: 11.9 10*3/uL — ABNORMAL HIGH (ref 1.7–7.7)
Neutrophils Relative %: 86 %
Platelets: 178 10*3/uL (ref 150–400)
RBC: 3.42 MIL/uL — ABNORMAL LOW (ref 3.87–5.11)
RDW: 12.4 % (ref 11.5–15.5)
WBC: 13.8 10*3/uL — ABNORMAL HIGH (ref 4.0–10.5)
nRBC: 0 % (ref 0.0–0.2)

## 2020-11-16 LAB — COMPREHENSIVE METABOLIC PANEL
ALT: 48 U/L — ABNORMAL HIGH (ref 0–44)
AST: 88 U/L — ABNORMAL HIGH (ref 15–41)
Albumin: 2.6 g/dL — ABNORMAL LOW (ref 3.5–5.0)
Alkaline Phosphatase: 46 U/L (ref 38–126)
Anion gap: 13 (ref 5–15)
BUN: 50 mg/dL — ABNORMAL HIGH (ref 8–23)
CO2: 17 mmol/L — ABNORMAL LOW (ref 22–32)
Calcium: 8.1 mg/dL — ABNORMAL LOW (ref 8.9–10.3)
Chloride: 101 mmol/L (ref 98–111)
Creatinine, Ser: 3.32 mg/dL — ABNORMAL HIGH (ref 0.44–1.00)
GFR, Estimated: 14 mL/min — ABNORMAL LOW (ref >60)
Glucose, Bld: 238 mg/dL — ABNORMAL HIGH (ref 70–99)
Potassium: 3.8 mmol/L (ref 3.5–5.1)
Sodium: 131 mmol/L — ABNORMAL LOW (ref 135–145)
Total Bilirubin: 0.3 mg/dL (ref 0.3–1.2)
Total Protein: 5.5 g/dL — ABNORMAL LOW (ref 6.5–8.1)

## 2020-11-16 LAB — CBG MONITORING, ED
Glucose-Capillary: 233 mg/dL — ABNORMAL HIGH (ref 70–99)
Glucose-Capillary: 164 mg/dL — ABNORMAL HIGH (ref 70–99)
Glucose-Capillary: 219 mg/dL — ABNORMAL HIGH (ref 70–99)
Glucose-Capillary: 178 mg/dL — ABNORMAL HIGH (ref 70–99)

## 2020-11-16 LAB — TROPONIN I (HIGH SENSITIVITY): Troponin I (High Sensitivity): 1569 ng/L (ref <18)

## 2020-11-16 LAB — GLUCOSE, CAPILLARY: Glucose-Capillary: 202 mg/dL — ABNORMAL HIGH (ref 70–99)

## 2020-11-16 LAB — FERRITIN: Ferritin: 468 ng/mL — ABNORMAL HIGH (ref 11–307)

## 2020-11-16 LAB — MAGNESIUM: Magnesium: 1.7 mg/dL (ref 1.7–2.4)

## 2020-11-16 LAB — C-REACTIVE PROTEIN: CRP: 5.4 mg/dL — ABNORMAL HIGH (ref <1.0)

## 2020-11-16 LAB — PHOSPHORUS: Phosphorus: 4.3 mg/dL (ref 2.5–4.6)

## 2020-11-16 LAB — ABO/RH: ABO/RH(D): O POS

## 2020-11-16 LAB — D-DIMER, QUANTITATIVE: D-Dimer, Quant: 2.3 ug/mL-FEU — ABNORMAL HIGH (ref 0.00–0.50)

## 2020-11-16 LAB — CK: Total CK: 1229 U/L — ABNORMAL HIGH (ref 38–234)

## 2020-11-16 MED ORDER — SODIUM CHLORIDE 0.9 % IV SOLN: INTRAVENOUS | Status: AC

## 2020-11-16 MED ORDER — CARVEDILOL 6.25 MG PO TABS
6.2500 mg | ORAL_TABLET | Freq: Two times a day (BID) | ORAL | Status: DC
  Administered 2020-11-16 – 2020-11-20 (×9): 6.25 mg via ORAL
  Filled 2020-11-16 (×9): qty 1

## 2020-11-16 MED ORDER — CALCIUM CARBONATE ANTACID 500 MG PO CHEW
1.0000 | CHEWABLE_TABLET | Freq: Four times a day (QID) | ORAL | Status: DC | PRN
  Administered 2020-11-16 – 2020-11-19 (×5): 200 mg via ORAL
  Filled 2020-11-16 (×5): qty 1

## 2020-11-16 MED ORDER — HYDRALAZINE HCL 25 MG PO TABS
25.0000 mg | ORAL_TABLET | Freq: Three times a day (TID) | ORAL | Status: DC
  Administered 2020-11-16 – 2020-11-17 (×3): 25 mg via ORAL
  Filled 2020-11-16 (×3): qty 1

## 2020-11-16 MED ORDER — ISOSORBIDE MONONITRATE ER 30 MG PO TB24
30.0000 mg | ORAL_TABLET | Freq: Every day | ORAL | Status: DC
  Administered 2020-11-16 – 2020-11-20 (×5): 30 mg via ORAL
  Filled 2020-11-16 (×5): qty 1

## 2020-11-16 NOTE — Progress Notes (Signed)
PROGRESS NOTE  Brenda Patterson LKG:[id number] DOB: December 05, 1940 DOA: 11/14/2020 PCP: [name], MD  Brief History   80 year old woman PMH CKD, diabetes mellitus presented with nausea, vomiting, diarrhea for 1 week, symptomatic from Covid test + January 2 but no respiratory symptoms.  Admitted for multifocal pneumonia secondary to COVID-19 with associated nausea vomiting and diarrhea, dehydration, rhabdomyolysis, elevated troponin, new left bundle branch block and wall motion abnormalities concerning for myocarditis or ischemia.  Seen by cardiology not a candidate for cath secondary to renal insufficiency.  Renal function failed to improve with fluids, nephrology consulted.  Elevated troponin with new left bundle branch block, new left ventricular systolic dysfunction --remains asymptomatic, no hypoxia.  Differential includes myopericarditis in the setting of COVID.  ACS doubted. --Continue management per cardiology.  Beta-blocker, consider hydralazine and nitrates.  Not a candidate for ACE inhibitor secondary to renal insufficiency. --No evidence of volume overload.  Gentle IV fluids today.  COVID-19 multifocal pneumonia with associated nausea, vomiting diarrhea -- Remains in symptomatic despite chest x-ray findings, no hypoxia.  Inflammatory markers without significant change today.  . CXR on admit: multifocal pneumonia . Fever: none . Oxygen requirement: none . Antibiotics: none, procalcitonin .16  . Remdesivir: 1/9 > . Steroids: 1/9 >  . Actemra/baricitinib: not indicated . Vitamin C and Zinc . Proning: recommended   Inflammatory markers: . CRP: 5.2 > 5.4 . D-dimer: 2.75 > 2.3  Elevated CK --no hx to suggest rhabdo, suspect elevated secondary to COVID, however patient was on statin which has been discontinued. --CK remains high, continue IV fluids, trend CK  AKI superimposed on CKD stage IIIa -- Thought secondary to nausea vomiting and diarrhea.  FENa suggests  intrinsic, consider ATN from dehydration.  May be secondary to COVID. -- Resume gentle IV fluids, continue to monitor volume status closely, strict I and O, urine studies, hold potential nephrotoxins, -- Renal ultrasound nonacute.  Nephrology consulted for further recommendations.  Hyponatremia, low CO2 likely secondary to GI losses -- [place]est, asymptomatic.  Diabetes mellitus type 2 --CBG remains stable.  Glipizide on hold.  Continue sliding scale insulin.  Anemia of chronic disease --Stable  Disposition Plan:  Discussion: as above  Dispo: The patient is from: Home              Anticipated d/c is to: Home              Anticipated d/c date is: 3 days              Patient currently is not medically stable to d/c.  DVT prophylaxis: SCDs Start: 11/15/20 6644   Code Status: Full Code Family Communication: none  Murray Hodgkins, MD  [facility] Direct contact: see [email or web address] (further directions at bottom of note if needed) 7PM-7AM contact night coverage as at bottom of note 11/16/2020, 12:03 PM  LOS: 1 day     Consults:  . Nephrology   Interval History/Subjective  CC: f/u covid  Feels fine, no complaints No CP No SOB Urinating fine  Objective   Vitals:  Vitals:   11/16/20 0827 11/16/20 1137  BP: 139/80 139/80  Pulse: 74 85  Resp: 20 18  Temp: 98 F (36.7 C)   SpO2: 96%     Exam: Constitutional:   . Appears calm and comfortable ENMT:  . grossly normal hearing  Respiratory:  . CTA bilaterally, no w/r/r.  . Respiratory effort normal.  Cardiovascular:  . RRR, no m/r/g . No LE extremity edema   Psychiatric:  .  Mental status o Mood, affect appropriate  I have personally reviewed the following:   Today's Data  . UOP not recorded, no weight recorded . CBG stable . Renal u/s unremarkable . Creatinine without improvement . Mild transaminitis noted . Total CK without significant change . Troponin elevated 1569  Scheduled Meds: . albuterol  2 puff  Inhalation Q6H  . aspirin EC  81 mg Oral Daily  . insulin aspart  0-9 Units Subcutaneous Q4H  . levothyroxine  25 mcg Oral QAC breakfast  . methylPREDNISolone (SOLU-MEDROL) injection  0.5 mg/kg Intravenous Q12H   Followed by  . [START ON 11/17/2020] predniSONE  50 mg Oral Daily  . sertraline  50 mg Oral Daily  . sodium chloride flush  3 mL Intravenous Q12H   Continuous Infusions: . sodium chloride    . remdesivir 100 mg in NS 100 mL Stopped (11/15/20 1000)    Principal Problem:   Pneumonia due to COVID-19 virus Active Problems:   Type 2 diabetes mellitus, controlled ([place])   Hyperlipidemia   Essential hypertension   Obesity (BMI 30-39.9)   Nausea vomiting and diarrhea   Anemia of chronic disease   CKD (chronic kidney disease) stage 3, GFR 30-59 ml/min (HCC)   Elevated troponin   Abnormal ECG   AKI (acute kidney injury) (HCC)   LBBB (left bundle branch block)   LOS: 1 day   How to contact the [facility] Attending or Consulting provider [place] or covering provider during after hours [place], for this patient?  1. Check the care team in [facility] and look for a) attending/consulting [facility] provider listed and b) the [facility] team listed 2. Log into [email or web address] and use [place]'s universal password to access. If you do not have the password, please contact the hospital operator. 3. Locate the [facility] provider you are looking for under [facility] and page to a number that you can be directly reached. 4. If you still have difficulty reaching the provider, please page the [facility] (Director on Call) for the Hospitalists listed on amion for assistance.

## 2020-11-16 NOTE — ED Notes (Signed)
Help patient up to the bedside toilet patient did well patient is now back in bed got patient breakfast tray patient is sitting up eating breakfast checked patient cbg it was 164 notified RN of blood sugar patient is resting with call bell in reach

## 2020-11-16 NOTE — Plan of Care (Signed)
  Problem: Education: Goal: Knowledge of risk factors and measures for prevention of condition will improve Outcome: Progressing   Problem: Coping: Goal: Psychosocial and spiritual needs will be supported Outcome: Progressing   Problem: Respiratory: Goal: Will maintain a patent airway Outcome: Progressing Goal: Complications related to the disease process, condition or treatment will be avoided or minimized Outcome: Progressing   

## 2020-11-16 NOTE — Progress Notes (Addendum)
Progress Note  Patient Name: Brenda Patterson Date of Encounter: 11/16/2020  [facility] Cardiologist: No primary care provider on file. [place]- new  Subjective   No CP or shortness of breath  Inpatient Medications    Scheduled Meds: . albuterol  2 puff Inhalation Q6H  . aspirin EC  81 mg Oral Daily  . carvedilol  6.25 mg Oral BID  . hydrALAZINE  25 mg Oral TID  . insulin aspart  0-9 Units Subcutaneous Q4H  . isosorbide mononitrate  30 mg Oral Daily  . levothyroxine  25 mcg Oral QAC breakfast  . methylPREDNISolone (SOLU-MEDROL) injection  0.5 mg/kg Intravenous Q12H   Followed by  . [START ON 11/17/2020] predniSONE  50 mg Oral Daily  . sertraline  50 mg Oral Daily  . sodium chloride flush  3 mL Intravenous Q12H   Continuous Infusions: . sodium chloride 75 mL/hr at 11/16/20 1230  . remdesivir 100 mg in NS 100 mL Stopped (11/16/20 1308)   PRN Meds: acetaminophen, albuterol, guaiFENesin-dextromethorphan, HYDROcodone-acetaminophen, ondansetron **OR** ondansetron (ZOFRAN) IV   Vital Signs    Vitals:   11/16/20 0650 11/16/20 0827 11/16/20 1137 11/16/20 1208  BP: 136/65 139/80 139/80 (!) 176/72  Pulse: 73 74 85 83  Resp: (!) 21 20 18 18   Temp:  98 F (36.7 C)    TempSrc:  Oral    SpO2: 96% 96%  95%    Intake/Output Summary (Last 24 hours) at 11/16/2020 1332 Last data filed at 11/15/2020 2042 Gross per 24 hour  Intake 783.15 ml  Output --  Net 783.15 ml   Last 3 Weights 11/12/2020 08/10/2020 07/20/2020  Weight (lbs) 192 lb 193 lb 7 oz (No Data)  Weight (kg) 87.091 kg 87.743 kg (No Data)      Telemetry    NSR, LBBB - Personally Reviewed  ECG      Physical Exam   GEN: No acute distress.  Lying flat without difficulty   Respiratory: No shortness of breath  Neuro:  Nonfocal  Psych: Normal affect   Labs    High Sensitivity Troponin:   Recent Labs  Lab 11/14/20 1703 11/14/20 1922 11/14/20 2344 11/15/20 0249 11/16/20 0500  TROPONINIHS 181*  271* 262* 1,342* 1,569*      Chemistry Recent Labs  Lab 11/12/20 2120 11/14/20 1451 11/14/20 1922 11/16/20 0500  NA 131* 127*  --  131*  K 3.6 3.5  --  3.8  CL 98 98  --  101  CO2 21* 18*  --  17*  GLUCOSE 167* 178*  --  238*  BUN 42* 35*  --  50*  CREATININE 3.09* 3.02*  --  3.32*  CALCIUM 8.6* 7.8*  --  8.1*  PROT 6.8  --  5.5* 5.5*  ALBUMIN 3.4*  --  2.7* 2.6*  AST 41  --  75* 88*  ALT 29  --  37 48*  ALKPHOS 55  --  38 46  BILITOT 0.6  --  0.7 0.3  GFRNONAA 15* 15*  --  14*  ANIONGAP 12 11  --  13     Hematology Recent Labs  Lab 11/12/20 2120 11/14/20 1451 11/15/20 0249 11/16/20 0500  WBC 6.6 8.2  --  13.8*  RBC 3.80* 3.48* 3.33* 3.42*  HGB 11.8* 10.4*  --  10.4*  HCT 34.6* 31.9*  --  30.2*  MCV 91.1 91.7  --  88.3  MCH 31.1 29.9  --  30.4  MCHC 34.1 32.6  --  34.4  RDW 12.3 12.1  --  12.4  PLT 154 123*  --  178    BNPNo results for input(s): BNP, PROBNP in the last 168 hours.   DDimer  Recent Labs  Lab 11/15/20 0249 11/16/20 0500  DDIMER 2.75* 2.30*     Radiology    US RENAL  Result Date: 11/16/2020 CLINICAL DATA:  Acute kidney injury EXAM: RENAL / URINARY TRACT ULTRASOUND COMPLETE COMPARISON:  08/23/2020 FINDINGS: Right Kidney: Renal measurements: 9.0 x 5.1 x 4.7 cm = volume: 113 mL. Increased renal parenchymal echogenicity. No shadowing stone or hydronephrosis visualized. Again seen are multiple simple appearing right renal cysts including a 1.4 cm upper pole cyst, 1.6 cm cyst within the interpolar region and a 1.9 cm lower pole cyst. Left Kidney: Renal measurements: 8.4 x 5.4 x 5.1 cm = volume: 119 mL. Increased renal parenchymal echogenicity. No shadowing stone or hydronephrosis visualized. 1.8 cm upper pole left renal cyst. Bladder: Appears normal for degree of bladder distention. Other: None. IMPRESSION: 1. Negative for obstructive uropathy. 2. Findings of chronic medical renal disease. Electronically Signed   By: Davina Poke D.O.   On:  11/16/2020 10:25   DG Chest Portable 1 View  Result Date: 11/14/2020 CLINICAL DATA:  80 year old female with positive COVID-19. EXAM: PORTABLE CHEST 1 VIEW COMPARISON:  Chest radiograph date 06/05/2018 FINDINGS: Bilateral confluent and streaky densities consistent with multifocal pneumonia and in keeping with COVID-19. Clinical correlation is recommended there is no pleural effusion pneumothorax. There is mild cardiomegaly. Atherosclerotic calcification of the aorta. No acute osseous pathology. IMPRESSION: Multifocal pneumonia. Electronically Signed   By: Anner Crete M.D.   On: 11/14/2020 19:39   ECHOCARDIOGRAM COMPLETE  Result Date: 11/15/2020    ECHOCARDIOGRAM REPORT   Patient Name:   Brenda Patterson Date of Exam: 11/15/2020 Medical Rec #:  [id number]             Height:       62.0 in Accession #:    [id number]            Weight:       192.0 lb Date of Birth:  04-14-1941              BSA:          1.879 m Patient Age:    80 years              BP:           122/42 mmHg Patient Gender: F                     HR:           72 bpm. Exam Location:  Inpatient Procedure: 2D Echo, Cardiac Doppler, Color Doppler and Intracardiac            Opacification Agent Indications:    R07.9* Chest pain, unspecified  History:        Patient has prior history of Echocardiogram examinations, most                 recent 12/02/2012. Abnormal ECG, Arrythmias:RBBB; Risk                 Factors:Diabetes, Dyslipidemia and Hypertension. Covid positive.                 Elevated troponin. Pneumonia.  Sonographer:    Roseanna Rainbow RDCS Referring Phys: Reading  Sonographer Comments: Technically difficult study due to poor echo windows and  patient is morbidly obese. Image acquisition challenging due to patient body habitus and Image acquisition challenging due to uncooperative patient. Had to pause exam due to patient throwing off clothes and nearly grabbing me, and rolling out of bed. Patient calmed down, and I continued  the study. IMPRESSIONS  1. Left ventricular ejection fraction, by estimation, is 20 to 25%. The left ventricle has severely decreased function. The left ventricle demonstrates regional wall motion abnormalities (see scoring diagram/findings for description). There is severe concentric left ventricular hypertrophy. Left ventricular diastolic parameters are indeterminate. Elevated left ventricular end-diastolic pressure. There is akinesis of the left ventricular, basal-mid inferoseptal wall and anteroseptal wall. There is akinesis of the left ventricular, apical septal wall, inferolateral wall and anterior wall. There is akinesis of the left ventricular, entire inferior wall and apical segment.  2. Right ventricular systolic function is normal. The right ventricular size is normal. There is moderately elevated pulmonary artery systolic pressure. The estimated right ventricular systolic pressure is 58.0 mmHg.  3. The mitral valve is degenerative. Mild to moderate mitral valve regurgitation. No evidence of mitral stenosis. Moderate mitral annular calcification.  4. The aortic valve is normal in structure. Aortic valve regurgitation is not visualized. No aortic stenosis is present.  5. The inferior vena cava is dilated in size with <50% respiratory variability, suggesting right atrial pressure of 15 mmHg. FINDINGS  Left Ventricle: Left ventricular ejection fraction, by estimation, is 20 to 25%. The left ventricle has severely decreased function. The left ventricle demonstrates regional wall motion abnormalities. Definity contrast agent was given IV to delineate the left ventricular endocardial borders. The left ventricular internal cavity size was normal in size. There is severe concentric left ventricular hypertrophy. Abnormal (paradoxical) septal motion, consistent with left bundle branch block. Left ventricular diastolic parameters are indeterminate. Elevated left ventricular end-diastolic pressure. Right Ventricle:  The right ventricular size is normal. No increase in right ventricular wall thickness. Right ventricular systolic function is normal. There is moderately elevated pulmonary artery systolic pressure. The tricuspid regurgitant velocity is 3.14 m/s, and with an assumed right atrial pressure of 15 mmHg, the estimated right ventricular systolic pressure is 99.8 mmHg. Left Atrium: Left atrial size was normal in size. Right Atrium: Right atrial size was normal in size. Pericardium: There is no evidence of pericardial effusion. Mitral Valve: The mitral valve is degenerative in appearance. There is mild thickening of the mitral valve leaflet(s). Moderate mitral annular calcification. Mild to moderate mitral valve regurgitation. No evidence of mitral valve stenosis. MV peak gradient, 13.0 mmHg. The mean mitral valve gradient is 3.0 mmHg. Tricuspid Valve: The tricuspid valve is normal in structure. Tricuspid valve regurgitation is mild . No evidence of tricuspid stenosis. Aortic Valve: The aortic valve is normal in structure. Aortic valve regurgitation is not visualized. No aortic stenosis is present. Pulmonic Valve: The pulmonic valve was normal in structure. Pulmonic valve regurgitation is not visualized. No evidence of pulmonic stenosis. Aorta: The aortic root is normal in size and structure. Venous: The inferior vena cava is dilated in size with less than 50% respiratory variability, suggesting right atrial pressure of 15 mmHg. IAS/Shunts: The interatrial septum appears to be lipomatous. No atrial level shunt detected by color flow Doppler.  LEFT VENTRICLE PLAX 2D LVIDd:         4.90 cm      Diastology LVIDs:         4.70 cm      LV e' medial:    3.05 cm/s LV PW:  2.10 cm      LV E/e' medial:  47.2 LV IVS:        1.70 cm      LV e' lateral:   4.90 cm/s LVOT diam:     2.20 cm      LV E/e' lateral: 29.4 LV SV:         74 LV SV Index:   39 LVOT Area:     3.80 cm  LV Volumes (MOD) LV vol d, MOD A2C: 121.0 ml LV vol d,  MOD A4C: 133.0 ml LV vol s, MOD A2C: 85.5 ml LV vol s, MOD A4C: 120.0 ml LV SV MOD A2C:     35.5 ml LV SV MOD A4C:     133.0 ml LV SV MOD BP:      25.8 ml RIGHT VENTRICLE             IVC RV S prime:     15.00 cm/s  IVC diam: 2.10 cm TAPSE (M-mode): 1.9 cm LEFT ATRIUM             Index       RIGHT ATRIUM           Index LA diam:        4.00 cm 2.13 cm/m  RA Area:     10.40 cm LA Vol (A2C):   49.5 ml 26.35 ml/m RA Volume:   22.40 ml  11.92 ml/m LA Vol (A4C):   51.0 ml 27.14 ml/m LA Biplane Vol: 52.3 ml 27.84 ml/m  AORTIC VALVE LVOT Vmax:   98.90 cm/s LVOT Vmean:  61.100 cm/s LVOT VTI:    0.195 m  AORTA Ao Root diam: 3.30 cm Ao Asc diam:  3.00 cm MITRAL VALVE                 TRICUSPID VALVE MV Area (PHT): 5.38 cm      TR Peak grad:   39.4 mmHg MV Peak grad:  13.0 mmHg     TR Vmax:        314.00 cm/s MV Mean grad:  3.0 mmHg MV Vmax:       1.80 m/s      SHUNTS MV Vmean:      66.7 cm/s     Systemic VTI:  0.20 m MV Decel Time: 141 msec      Systemic Diam: 2.20 cm MR Peak grad:    128.1 mmHg MR Mean grad:    99.0 mmHg MR Vmax:         566.00 cm/s MR Vmean:        479.0 cm/s MR PISA:         1.57 cm MR PISA Eff ROA: 11 mm MR PISA Radius:  0.50 cm MV E velocity: 144.00 cm/s Fransico Him MD Electronically signed by Fransico Him MD Signature Date/Time: 11/15/2020/1:21:39 PM    Final     Cardiac Studies   Low EF by echo  Patient Profile     80 y.o. female with COVID, renal failure and low EF  Assessment & Plan   Visit done over the phone to minimize exposure and conserve PPE.   Acute systolic heart failure: BP is high. Start hydralazine 25 mg TID, and Imdur 30.  Continue beta blocker for acute systolic heart failure.  No invasive cardiac w/u planned at this time.  No ACE-I due to renal failure.  Hydration has helped her sx.     For questions or updates, please contact [place] Please consult [email or web address] for contact info under        Signed, Larae Grooms, MD  11/16/2020, 1:32 PM

## 2020-11-16 NOTE — ED Notes (Signed)
Lunch Tray Ordered @ [id number].

## 2020-11-16 NOTE — ED Notes (Signed)
Pt reports heartburn, MD notified

## 2020-11-16 NOTE — ED Notes (Signed)
Tele  Breakfast Ordered 

## 2020-11-16 NOTE — ED Notes (Signed)
Lunch Tray Ordered @ 1715. 

## 2020-11-17 DIAGNOSIS — J1282 Pneumonia due to coronavirus disease 2019: Secondary | ICD-10-CM | POA: Diagnosis not present

## 2020-11-17 DIAGNOSIS — N179 Acute kidney failure, unspecified: Secondary | ICD-10-CM | POA: Diagnosis not present

## 2020-11-17 DIAGNOSIS — I5021 Acute systolic (congestive) heart failure: Secondary | ICD-10-CM | POA: Diagnosis not present

## 2020-11-17 DIAGNOSIS — U071 COVID-19: Secondary | ICD-10-CM | POA: Diagnosis not present

## 2020-11-17 LAB — CBC WITH DIFFERENTIAL/PLATELET
Abs Immature Granulocytes: 0 10*3/uL (ref 0.00–0.07)
Basophils Absolute: 0 10*3/uL (ref 0.0–0.1)
Basophils Relative: 0 %
Eosinophils Absolute: 0 10*3/uL (ref 0.0–0.5)
Eosinophils Relative: 0 %
HCT: 26.6 % — ABNORMAL LOW (ref 36.0–46.0)
Hemoglobin: 9.3 g/dL — ABNORMAL LOW (ref 12.0–15.0)
Lymphocytes Relative: 1 %
Lymphs Abs: 0.1 10*3/uL — ABNORMAL LOW (ref 0.7–4.0)
MCH: 30.6 pg (ref 26.0–34.0)
MCHC: 35 g/dL (ref 30.0–36.0)
MCV: 87.5 fL (ref 80.0–100.0)
Monocytes Absolute: 0.1 10*3/uL (ref 0.1–1.0)
Monocytes Relative: 1 %
Neutro Abs: 12.7 10*3/uL — ABNORMAL HIGH (ref 1.7–7.7)
Neutrophils Relative %: 98 %
Platelets: 190 10*3/uL (ref 150–400)
RBC: 3.04 MIL/uL — ABNORMAL LOW (ref 3.87–5.11)
RDW: 12.4 % (ref 11.5–15.5)
WBC: 13 10*3/uL — ABNORMAL HIGH (ref 4.0–10.5)
nRBC: 0 % (ref 0.0–0.2)
nRBC: 0 /100 WBC

## 2020-11-17 LAB — COMPREHENSIVE METABOLIC PANEL
ALT: 51 U/L — ABNORMAL HIGH (ref 0–44)
AST: 84 U/L — ABNORMAL HIGH (ref 15–41)
Albumin: 2.4 g/dL — ABNORMAL LOW (ref 3.5–5.0)
Alkaline Phosphatase: 46 U/L (ref 38–126)
Anion gap: 11 (ref 5–15)
BUN: 59 mg/dL — ABNORMAL HIGH (ref 8–23)
CO2: 17 mmol/L — ABNORMAL LOW (ref 22–32)
Calcium: 7.8 mg/dL — ABNORMAL LOW (ref 8.9–10.3)
Chloride: 103 mmol/L (ref 98–111)
Creatinine, Ser: 3.34 mg/dL — ABNORMAL HIGH (ref 0.44–1.00)
GFR, Estimated: 14 mL/min — ABNORMAL LOW (ref >60)
Glucose, Bld: 203 mg/dL — ABNORMAL HIGH (ref 70–99)
Potassium: 4.1 mmol/L (ref 3.5–5.1)
Sodium: 131 mmol/L — ABNORMAL LOW (ref 135–145)
Total Bilirubin: 0.5 mg/dL (ref 0.3–1.2)
Total Protein: 5.2 g/dL — ABNORMAL LOW (ref 6.5–8.1)

## 2020-11-17 LAB — GLUCOSE, CAPILLARY
Glucose-Capillary: 167 mg/dL — ABNORMAL HIGH (ref 70–99)
Glucose-Capillary: 176 mg/dL — ABNORMAL HIGH (ref 70–99)
Glucose-Capillary: 188 mg/dL — ABNORMAL HIGH (ref 70–99)
Glucose-Capillary: 195 mg/dL — ABNORMAL HIGH (ref 70–99)
Glucose-Capillary: 215 mg/dL — ABNORMAL HIGH (ref 70–99)
Glucose-Capillary: 227 mg/dL — ABNORMAL HIGH (ref 70–99)

## 2020-11-17 LAB — FERRITIN: Ferritin: 365 ng/mL — ABNORMAL HIGH (ref 11–307)

## 2020-11-17 LAB — C-REACTIVE PROTEIN: CRP: 2.9 mg/dL — ABNORMAL HIGH (ref <1.0)

## 2020-11-17 LAB — MAGNESIUM: Magnesium: 1.7 mg/dL (ref 1.7–2.4)

## 2020-11-17 LAB — PHOSPHORUS: Phosphorus: 4.1 mg/dL (ref 2.5–4.6)

## 2020-11-17 LAB — D-DIMER, QUANTITATIVE: D-Dimer, Quant: 2.01 ug/mL-FEU — ABNORMAL HIGH (ref 0.00–0.50)

## 2020-11-17 LAB — CK: Total CK: 1159 U/L — ABNORMAL HIGH (ref 38–234)

## 2020-11-17 MED ORDER — HYDRALAZINE HCL 50 MG PO TABS
50.0000 mg | ORAL_TABLET | Freq: Three times a day (TID) | ORAL | Status: DC
  Administered 2020-11-17 – 2020-11-20 (×9): 50 mg via ORAL
  Filled 2020-11-17 (×9): qty 1

## 2020-11-17 NOTE — Plan of Care (Signed)
  Problem: Education: Goal: Knowledge of risk factors and measures for prevention of condition will improve Outcome: Progressing   Problem: Coping: Goal: Psychosocial and spiritual needs will be supported Outcome: Progressing   Problem: Respiratory: Goal: Will maintain a patent airway Outcome: Progressing Goal: Complications related to the disease process, condition or treatment will be avoided or minimized Outcome: Progressing   

## 2020-11-17 NOTE — Progress Notes (Signed)
Progress Note  Patient Name: Brenda Patterson Date of Encounter: 11/17/2020  [facility] Cardiologist: No primary care provider on file. [place]- new  Subjective   No chest pain or shortness of breath.  Taste is not back to normal but she is trying to eat and drink anyway.   Inpatient Medications    Scheduled Meds: . albuterol  2 puff Inhalation Q6H  . aspirin EC  81 mg Oral Daily  . carvedilol  6.25 mg Oral BID  . hydrALAZINE  50 mg Oral TID  . insulin aspart  0-9 Units Subcutaneous Q4H  . isosorbide mononitrate  30 mg Oral Daily  . levothyroxine  25 mcg Oral QAC breakfast  . predniSONE  50 mg Oral Daily  . sertraline  50 mg Oral Daily  . sodium chloride flush  3 mL Intravenous Q12H   Continuous Infusions: . remdesivir 100 mg in NS 100 mL 100 mg (11/17/20 0822)   PRN Meds: acetaminophen, albuterol, calcium carbonate, guaiFENesin-dextromethorphan, HYDROcodone-acetaminophen, ondansetron **OR** ondansetron (ZOFRAN) IV   Vital Signs    Vitals:   11/17/20 0758 11/17/20 1016 11/17/20 1238 11/17/20 1520  BP: (!) 162/75 (!) 141/64 (!) 144/70 (!) 152/65  Pulse: 69 67 70 75  Resp: (!) 9 18    Temp: 97.9 F (36.6 C) 97.7 F (36.5 C) 97.6 F (36.4 C) 97.9 F (36.6 C)  TempSrc: Oral Oral Oral Oral  SpO2: 94% 94%  95%    Intake/Output Summary (Last 24 hours) at 11/17/2020 1713 Last data filed at 11/17/2020 1106 Gross per 24 hour  Intake 3 ml  Output -  Net 3 ml   Last 3 Weights 11/12/2020 08/10/2020 07/20/2020  Weight (lbs) 192 lb 193 lb 7 oz (No Data)  Weight (kg) 87.091 kg 87.743 kg (No Data)      Telemetry    NSR- Personally Reviewed  ECG      Physical Exam   GEN: No acute distress.    Respiratory: no shortnes of breath   Psych: Normal affect   Limited by phone format  Labs    High Sensitivity Troponin:   Recent Labs  Lab 11/14/20 1703 11/14/20 1922 11/14/20 2344 11/15/20 0249 11/16/20 0500  TROPONINIHS 181* 271* 262* 1,342* 1,569*       Chemistry Recent Labs  Lab 11/14/20 1451 11/14/20 1922 11/16/20 0500 11/17/20 0109  NA 127*  --  131* 131*  K 3.5  --  3.8 4.1  CL 98  --  101 103  CO2 18*  --  17* 17*  GLUCOSE 178*  --  238* 203*  BUN 35*  --  50* 59*  CREATININE 3.02*  --  3.32* 3.34*  CALCIUM 7.8*  --  8.1* 7.8*  PROT  --  5.5* 5.5* 5.2*  ALBUMIN  --  2.7* 2.6* 2.4*  AST  --  75* 88* 84*  ALT  --  37 48* 51*  ALKPHOS  --  38 46 46  BILITOT  --  0.7 0.3 0.5  GFRNONAA 15*  --  14* 14*  ANIONGAP 11  --  13 11     Hematology Recent Labs  Lab 11/14/20 1451 11/15/20 0249 11/16/20 0500 11/17/20 0109  WBC 8.2  --  13.8* 13.0*  RBC 3.48* 3.33* 3.42* 3.04*  HGB 10.4*  --  10.4* 9.3*  HCT 31.9*  --  30.2* 26.6*  MCV 91.7  --  88.3 87.5  MCH 29.9  --  30.4 30.6  MCHC 32.6  --  34.4 35.0  RDW 12.1  --  12.4 12.4  PLT 123*  --  178 190    BNPNo results for input(s): BNP, PROBNP in the last 168 hours.   DDimer  Recent Labs  Lab 11/15/20 0249 11/16/20 0500 11/17/20 0109  DDIMER 2.75* 2.30* 2.01*     Radiology    US RENAL  Result Date: 11/16/2020 CLINICAL DATA:  Acute kidney injury EXAM: RENAL / URINARY TRACT ULTRASOUND COMPLETE COMPARISON:  08/23/2020 FINDINGS: Right Kidney: Renal measurements: 9.0 x 5.1 x 4.7 cm = volume: 113 mL. Increased renal parenchymal echogenicity. No shadowing stone or hydronephrosis visualized. Again seen are multiple simple appearing right renal cysts including a 1.4 cm upper pole cyst, 1.6 cm cyst within the interpolar region and a 1.9 cm lower pole cyst. Left Kidney: Renal measurements: 8.4 x 5.4 x 5.1 cm = volume: 119 mL. Increased renal parenchymal echogenicity. No shadowing stone or hydronephrosis visualized. 1.8 cm upper pole left renal cyst. Bladder: Appears normal for degree of bladder distention. Other: None. IMPRESSION: 1. Negative for obstructive uropathy. 2. Findings of chronic medical renal disease. Electronically Signed   By: Davina Poke D.O.   On:  11/16/2020 10:25    Cardiac Studies   Low EF  Patient Profile     80 y.o. female acute systolic heart fialure in the setting of COVID  Assessment & Plan   Visit done over the phone to minimize exposure and to preserve PPE.   1) Acute systolic heart failure:  Increase hydralazine to 50 mg TID.  BP above target. Continue Imdur. No sx of chest pain or shortness of breath.    2) COVID: She seems to be recovering well.    3) AKI: Cr stable >3.  Limits therapy and testing for LV dysfunction.      For questions or updates, please contact [place] Please consult [email or web address] for contact info under        Signed, Larae Grooms, MD  11/17/2020, 5:13 PM

## 2020-11-17 NOTE — Progress Notes (Signed)
PROGRESS NOTE  Brenda Patterson QPY:[id number] DOB: 1940-11-07 DOA: 11/14/2020 PCP: [name], MD  Brief History   80 year old woman PMH CKD, diabetes mellitus presented with nausea, vomiting, diarrhea for 1 week, symptomatic from Covid test + January 2 but no respiratory symptoms.  Admitted for multifocal pneumonia secondary to COVID-19 with associated nausea vomiting and diarrhea, dehydration, rhabdomyolysis, elevated troponin, new left bundle branch block and wall motion abnormalities concerning for myocarditis or ischemia.  Seen by cardiology not a candidate for cath secondary to renal insufficiency.  Renal function failed to improve with fluids, nephrology consulted.  Elevated troponin with new left bundle branch block, new left ventricular systolic dysfunction -- Remains asymptomatic, no hypoxia.  Differential includes myopericarditis in the setting of COVID.  ACS doubted. -- Continue management per cardiology.  Beta-blocker, consider hydralazine and nitrates.  -- Not a candidate for ACE inhibitor secondary to renal insufficiency.  COVID-19 multifocal pneumonia with associated nausea, vomiting diarrhea -- Remains in symptomatic despite chest x-ray findings, no hypoxia.  Inflammatory markers without significant change today.  . CXR on admit: multifocal pneumonia . Fever: none . Oxygen requirement: none . Antibiotics: none, procalcitonin .16  . Remdesivir: 1/9 > . Steroids: 1/9 >  . Actemra/baricitinib: not indicated . Vitamin C and Zinc . Proning: recommended Recent Labs    11/14/20 2344 11/15/20 0249 11/16/20 0500 11/17/20 0109  DDIMER  --  2.75* 2.30* 2.01*  FERRITIN  --  372*  371* 468* 365*  LDH 296*  --   --   --   CRP  --  5.2* 5.4* 2.9*   Elevated CK --no hx or trauma to suggest rhabdo, suspect elevated secondary to COVID/dehydration, however patient was on statin which has been discontinued. --CK repeat downtrending slowly  AKI superimposed on CKD stage  IIIa -- Thought secondary to nausea vomiting and diarrhea.  FENa suggests intrinsic likely 2/2 poor PO intake, nausea/vomiting/diarrhea from covid. -- Listed medications at home include chlorthalidone, hydrochlorothiazide -- Increase PO intake today - long talk about increased water intake -- Renal ultrasound nonacute  Hyponatremia, hypovolemic -- Low bicarb likely secondary to GI losses -- [place], asymptomatic.  Diabetes mellitus type 2 -- CBG remains stable.  Glipizide on hold.  Continue sliding scale insulin.  Anemia of chronic disease --Stable Disposition Plan:  Discussion: as above  Dispo: The patient is from: Home              Anticipated d/c is to: Home              Anticipated d/c date is: 24-48h              Patient currently is not medically stable to d/c.  DVT prophylaxis: SCDs Start: 11/15/20 1245   Code Status: Full Code Family Communication: none  Holli Humbles DO  [facility] Direct contact: Epic/secure chat  7PM-7AM contact night coverage as at bottom of note 11/17/2020, 6:55 AM  LOS: 2 days     Consults:  . Nephrology  . Cardiology  Interval History/Subjective  No acute issues or events overnight, requesting discharge home, we discussed need for increased p.o. intake as creatinine is stabilized but not yet downtrending.  Otherwise denies nausea vomiting diarrhea constipation headache fevers or chills   Objective   Vitals:  Vitals:   11/16/20 2000 11/17/20 0025  BP: (!) 142/71 (!) 144/69  Pulse: 72 80  Resp: 18 20  Temp: 97.9 F (36.6 C) 97.9 F (36.6 C)  SpO2: 97% 94%  Exam: Constitutional:   . Appears calm and comfortable ENMT:  . grossly normal hearing  Respiratory:  . CTA bilaterally, no w/r/r.  . Respiratory effort normal.  Cardiovascular:  . RRR, no m/r/g . No LE extremity edema   Psychiatric:  . Mental status o Mood, affect appropriate  I have personally reviewed the following:   Today's Data  . UOP not  recorded, no weight recorded . CBG stable . Renal u/s unremarkable . Creatinine without improvement . Mild transaminitis noted . Total CK without significant change . Troponin elevated 1569  Scheduled Meds: . albuterol  2 puff Inhalation Q6H  . aspirin EC  81 mg Oral Daily  . carvedilol  6.25 mg Oral BID  . hydrALAZINE  25 mg Oral TID  . insulin aspart  0-9 Units Subcutaneous Q4H  . isosorbide mononitrate  30 mg Oral Daily  . levothyroxine  25 mcg Oral QAC breakfast  . methylPREDNISolone (SOLU-MEDROL) injection  0.5 mg/kg Intravenous Q12H   Followed by  . predniSONE  50 mg Oral Daily  . sertraline  50 mg Oral Daily  . sodium chloride flush  3 mL Intravenous Q12H   Continuous Infusions: . remdesivir 100 mg in NS 100 mL Stopped (11/16/20 1308)    Principal Problem:   Pneumonia due to COVID-19 virus Active Problems:   Type 2 diabetes mellitus, controlled ([place])   Hyperlipidemia   Essential hypertension   Obesity (BMI 30-39.9)   Nausea vomiting and diarrhea   Anemia of chronic disease   CKD (chronic kidney disease) stage 3, GFR 30-59 ml/min (HCC)   Elevated troponin   Abnormal ECG   AKI (acute kidney injury) (HCC)   LBBB (left bundle branch block)   LOS: 2 days

## 2020-11-18 DIAGNOSIS — U071 COVID-19: Secondary | ICD-10-CM | POA: Diagnosis not present

## 2020-11-18 DIAGNOSIS — J1282 Pneumonia due to coronavirus disease 2019: Secondary | ICD-10-CM | POA: Diagnosis not present

## 2020-11-18 LAB — CBC WITH DIFFERENTIAL/PLATELET
Abs Immature Granulocytes: 0.21 10*3/uL — ABNORMAL HIGH (ref 0.00–0.07)
Basophils Absolute: 0 10*3/uL (ref 0.0–0.1)
Basophils Relative: 0 %
Eosinophils Absolute: 0 10*3/uL (ref 0.0–0.5)
Eosinophils Relative: 0 %
HCT: 24.9 % — ABNORMAL LOW (ref 36.0–46.0)
Hemoglobin: 8.6 g/dL — ABNORMAL LOW (ref 12.0–15.0)
Immature Granulocytes: 2 %
Lymphocytes Relative: 9 %
Lymphs Abs: 1 10*3/uL (ref 0.7–4.0)
MCH: 30 pg (ref 26.0–34.0)
MCHC: 34.5 g/dL (ref 30.0–36.0)
MCV: 86.8 fL (ref 80.0–100.0)
Monocytes Absolute: 0.5 10*3/uL (ref 0.1–1.0)
Monocytes Relative: 5 %
Neutro Abs: 9.3 10*3/uL — ABNORMAL HIGH (ref 1.7–7.7)
Neutrophils Relative %: 84 %
Platelets: 225 10*3/uL (ref 150–400)
RBC: 2.87 MIL/uL — ABNORMAL LOW (ref 3.87–5.11)
RDW: 12.4 % (ref 11.5–15.5)
WBC: 11 10*3/uL — ABNORMAL HIGH (ref 4.0–10.5)
nRBC: 0 % (ref 0.0–0.2)

## 2020-11-18 LAB — COMPREHENSIVE METABOLIC PANEL
ALT: 59 U/L — ABNORMAL HIGH (ref 0–44)
AST: 81 U/L — ABNORMAL HIGH (ref 15–41)
Albumin: 2.3 g/dL — ABNORMAL LOW (ref 3.5–5.0)
Alkaline Phosphatase: 46 U/L (ref 38–126)
Anion gap: 11 (ref 5–15)
BUN: 69 mg/dL — ABNORMAL HIGH (ref 8–23)
CO2: 17 mmol/L — ABNORMAL LOW (ref 22–32)
Calcium: 7.6 mg/dL — ABNORMAL LOW (ref 8.9–10.3)
Chloride: 100 mmol/L (ref 98–111)
Creatinine, Ser: 3.44 mg/dL — ABNORMAL HIGH (ref 0.44–1.00)
GFR, Estimated: 13 mL/min — ABNORMAL LOW (ref >60)
Glucose, Bld: 184 mg/dL — ABNORMAL HIGH (ref 70–99)
Potassium: 3.9 mmol/L (ref 3.5–5.1)
Sodium: 128 mmol/L — ABNORMAL LOW (ref 135–145)
Total Bilirubin: 0.6 mg/dL (ref 0.3–1.2)
Total Protein: 4.7 g/dL — ABNORMAL LOW (ref 6.5–8.1)

## 2020-11-18 LAB — URINE CULTURE

## 2020-11-18 LAB — BASIC METABOLIC PANEL
Anion gap: 11 (ref 5–15)
BUN: 74 mg/dL — ABNORMAL HIGH (ref 8–23)
CO2: 17 mmol/L — ABNORMAL LOW (ref 22–32)
Calcium: 7.4 mg/dL — ABNORMAL LOW (ref 8.9–10.3)
Chloride: 99 mmol/L (ref 98–111)
Creatinine, Ser: 3.41 mg/dL — ABNORMAL HIGH (ref 0.44–1.00)
GFR, Estimated: 13 mL/min — ABNORMAL LOW (ref >60)
Glucose, Bld: 255 mg/dL — ABNORMAL HIGH (ref 70–99)
Potassium: 3.8 mmol/L (ref 3.5–5.1)
Sodium: 127 mmol/L — ABNORMAL LOW (ref 135–145)

## 2020-11-18 LAB — GLUCOSE, CAPILLARY
Glucose-Capillary: 174 mg/dL — ABNORMAL HIGH (ref 70–99)
Glucose-Capillary: 177 mg/dL — ABNORMAL HIGH (ref 70–99)
Glucose-Capillary: 177 mg/dL — ABNORMAL HIGH (ref 70–99)
Glucose-Capillary: 232 mg/dL — ABNORMAL HIGH (ref 70–99)
Glucose-Capillary: 242 mg/dL — ABNORMAL HIGH (ref 70–99)
Glucose-Capillary: 264 mg/dL — ABNORMAL HIGH (ref 70–99)

## 2020-11-18 LAB — CK: Total CK: 1157 U/L — ABNORMAL HIGH (ref 38–234)

## 2020-11-18 LAB — PHOSPHORUS: Phosphorus: 4.4 mg/dL (ref 2.5–4.6)

## 2020-11-18 LAB — D-DIMER, QUANTITATIVE: D-Dimer, Quant: 2.09 ug/mL-FEU — ABNORMAL HIGH (ref 0.00–0.50)

## 2020-11-18 LAB — C-REACTIVE PROTEIN: CRP: 1.6 mg/dL — ABNORMAL HIGH (ref <1.0)

## 2020-11-18 LAB — MAGNESIUM: Magnesium: 1.7 mg/dL (ref 1.7–2.4)

## 2020-11-18 LAB — FERRITIN: Ferritin: 308 ng/mL — ABNORMAL HIGH (ref 11–307)

## 2020-11-18 MED ORDER — LACTATED RINGERS IV BOLUS
500.0000 mL | Freq: Once | INTRAVENOUS | Status: AC
  Administered 2020-11-18: 500 mL via INTRAVENOUS

## 2020-11-18 NOTE — Discharge Summary (Incomplete)
Physician Discharge Summary  Chanele Douglas QAS:[id number] DOB: Mar 13, 1941 DOA: 11/14/2020  PCP: [name], MD  Admit date: 11/14/2020 Discharge date: 11/18/2020  Admitted From: *** Disposition:  ***  Recommendations for Outpatient Follow-up:  1. Follow up with PCP in 1-2 weeks 2. Please obtain BMP/CBC in one week 3. Please follow up on the following pending results:  Home Health:***  Equipment/Devices:***  Discharge Condition:***  CODE STATUS:***  Diet recommendation:    Brief/Interim Summary: ***  Discharge Diagnoses:  Principal Problem:   Pneumonia due to COVID-19 virus Active Problems:   Type 2 diabetes mellitus, controlled ([place])   Hyperlipidemia   Essential hypertension   Obesity (BMI 30-39.9)   Nausea vomiting and diarrhea   Anemia of chronic disease   CKD (chronic kidney disease) stage 3, GFR 30-59 ml/min (HCC)   Elevated troponin   Abnormal ECG   AKI (acute kidney injury) (HCC)   LBBB (left bundle branch block)    Discharge Instructions   Allergies as of 11/18/2020      Reactions   Azithromycin Nausea Only   Clarithromycin Other (See Comments)   Heparin    Patient states it makes her weak   Penicillins Rash   Rash noted inside her mouth and lips    Med Rec must be completed prior to using this SMARTLINK***       Allergies  Allergen Reactions  . Azithromycin Nausea Only  . Clarithromycin Other (See Comments)  . Heparin     Patient states it makes her weak  . Penicillins Rash    Rash noted inside her mouth and lips    Consultations:  ***Specify Physician/Group   Procedures/Studies: US RENAL  Result Date: 11/16/2020 CLINICAL DATA:  Acute kidney injury EXAM: RENAL / URINARY TRACT ULTRASOUND COMPLETE COMPARISON:  08/23/2020 FINDINGS: Right Kidney: Renal measurements: 9.0 x 5.1 x 4.7 cm = volume: 113 mL. Increased renal parenchymal echogenicity. No shadowing stone or hydronephrosis visualized. Again seen are multiple simple  appearing right renal cysts including a 1.4 cm upper pole cyst, 1.6 cm cyst within the interpolar region and a 1.9 cm lower pole cyst. Left Kidney: Renal measurements: 8.4 x 5.4 x 5.1 cm = volume: 119 mL. Increased renal parenchymal echogenicity. No shadowing stone or hydronephrosis visualized. 1.8 cm upper pole left renal cyst. Bladder: Appears normal for degree of bladder distention. Other: None. IMPRESSION: 1. Negative for obstructive uropathy. 2. Findings of chronic medical renal disease. Electronically Signed   By: Davina Poke D.O.   On: 11/16/2020 10:25   DG Chest Portable 1 View  Result Date: 11/14/2020 CLINICAL DATA:  80 year old female with positive COVID-19. EXAM: PORTABLE CHEST 1 VIEW COMPARISON:  Chest radiograph date 06/05/2018 FINDINGS: Bilateral confluent and streaky densities consistent with multifocal pneumonia and in keeping with COVID-19. Clinical correlation is recommended there is no pleural effusion pneumothorax. There is mild cardiomegaly. Atherosclerotic calcification of the aorta. No acute osseous pathology. IMPRESSION: Multifocal pneumonia. Electronically Signed   By: Anner Crete M.D.   On: 11/14/2020 19:39   ECHOCARDIOGRAM COMPLETE  Result Date: 11/15/2020    ECHOCARDIOGRAM REPORT   Patient Name:   Brenda Patterson Date of Exam: 11/15/2020 Medical Rec #:  [id number]             Height:       62.0 in Accession #:    [id number]            Weight:       192.0 lb Date of Birth:  [birth year]              BSA:          1.879 m Patient Age:    80 years              BP:           122/42 mmHg Patient Gender: F                     HR:           72 bpm. Exam Location:  Inpatient Procedure: 2D Echo, Cardiac Doppler, Color Doppler and Intracardiac            Opacification Agent Indications:    R07.9* Chest pain, unspecified  History:        Patient has prior history of Echocardiogram examinations, most                 recent 12/02/2012. Abnormal ECG, Arrythmias:RBBB; Risk                  Factors:Diabetes, Dyslipidemia and Hypertension. Covid positive.                 Elevated troponin. Pneumonia.  Sonographer:    Roseanna Rainbow RDCS Referring Phys: [place]  Sonographer Comments: Technically difficult study due to poor echo windows and patient is morbidly obese. Image acquisition challenging due to patient body habitus and Image acquisition challenging due to uncooperative patient. Had to pause exam due to patient throwing off clothes and nearly grabbing me, and rolling out of bed. Patient calmed down, and I continued the study. IMPRESSIONS  1. Left ventricular ejection fraction, by estimation, is 20 to 25%. The left ventricle has severely decreased function. The left ventricle demonstrates regional wall motion abnormalities (see scoring diagram/findings for description). There is severe concentric left ventricular hypertrophy. Left ventricular diastolic parameters are indeterminate. Elevated left ventricular end-diastolic pressure. There is akinesis of the left ventricular, basal-mid inferoseptal wall and anteroseptal wall. There is akinesis of the left ventricular, apical septal wall, inferolateral wall and anterior wall. There is akinesis of the left ventricular, entire inferior wall and apical segment.  2. Right ventricular systolic function is normal. The right ventricular size is normal. There is moderately elevated pulmonary artery systolic pressure. The estimated right ventricular systolic pressure is 10.2 mmHg.  3. The mitral valve is degenerative. Mild to moderate mitral valve regurgitation. No evidence of mitral stenosis. Moderate mitral annular calcification.  4. The aortic valve is normal in structure. Aortic valve regurgitation is not visualized. No aortic stenosis is present.  5. The inferior vena cava is dilated in size with <50% respiratory variability, suggesting right atrial pressure of 15 mmHg. FINDINGS  Left Ventricle: Left ventricular ejection fraction, by  estimation, is 20 to 25%. The left ventricle has severely decreased function. The left ventricle demonstrates regional wall motion abnormalities. Definity contrast agent was given IV to delineate the left ventricular endocardial borders. The left ventricular internal cavity size was normal in size. There is severe concentric left ventricular hypertrophy. Abnormal (paradoxical) septal motion, consistent with left bundle branch block. Left ventricular diastolic parameters are indeterminate. Elevated left ventricular end-diastolic pressure. Right Ventricle: The right ventricular size is normal. No increase in right ventricular wall thickness. Right ventricular systolic function is normal. There is moderately elevated pulmonary artery systolic pressure. The tricuspid regurgitant velocity is 3.14 m/s, and with an assumed right atrial pressure of 15 mmHg, the estimated right ventricular systolic  pressure is 54.4 mmHg. Left Atrium: Left atrial size was normal in size. Right Atrium: Right atrial size was normal in size. Pericardium: There is no evidence of pericardial effusion. Mitral Valve: The mitral valve is degenerative in appearance. There is mild thickening of the mitral valve leaflet(s). Moderate mitral annular calcification. Mild to moderate mitral valve regurgitation. No evidence of mitral valve stenosis. MV peak gradient, 13.0 mmHg. The mean mitral valve gradient is 3.0 mmHg. Tricuspid Valve: The tricuspid valve is normal in structure. Tricuspid valve regurgitation is mild . No evidence of tricuspid stenosis. Aortic Valve: The aortic valve is normal in structure. Aortic valve regurgitation is not visualized. No aortic stenosis is present. Pulmonic Valve: The pulmonic valve was normal in structure. Pulmonic valve regurgitation is not visualized. No evidence of pulmonic stenosis. Aorta: The aortic root is normal in size and structure. Venous: The inferior vena cava is dilated in size with less than 50% respiratory  variability, suggesting right atrial pressure of 15 mmHg. IAS/Shunts: The interatrial septum appears to be lipomatous. No atrial level shunt detected by color flow Doppler.  LEFT VENTRICLE PLAX 2D LVIDd:         4.90 cm      Diastology LVIDs:         4.70 cm      LV e' medial:    3.05 cm/s LV PW:         2.10 cm      LV E/e' medial:  47.2 LV IVS:        1.70 cm      LV e' lateral:   4.90 cm/s LVOT diam:     2.20 cm      LV E/e' lateral: 29.4 LV SV:         74 LV SV Index:   39 LVOT Area:     3.80 cm  LV Volumes (MOD) LV vol d, MOD A2C: 121.0 ml LV vol d, MOD A4C: 133.0 ml LV vol s, MOD A2C: 85.5 ml LV vol s, MOD A4C: 120.0 ml LV SV MOD A2C:     35.5 ml LV SV MOD A4C:     133.0 ml LV SV MOD BP:      25.8 ml RIGHT VENTRICLE             IVC RV S prime:     15.00 cm/s  IVC diam: 2.10 cm TAPSE (M-mode): 1.9 cm LEFT ATRIUM             Index       RIGHT ATRIUM           Index LA diam:        4.00 cm 2.13 cm/m  RA Area:     10.40 cm LA Vol (A2C):   49.5 ml 26.35 ml/m RA Volume:   22.40 ml  11.92 ml/m LA Vol (A4C):   51.0 ml 27.14 ml/m LA Biplane Vol: 52.3 ml 27.84 ml/m  AORTIC VALVE LVOT Vmax:   98.90 cm/s LVOT Vmean:  61.100 cm/s LVOT VTI:    0.195 m  AORTA Ao Root diam: 3.30 cm Ao Asc diam:  3.00 cm MITRAL VALVE                 TRICUSPID VALVE MV Area (PHT): 5.38 cm      TR Peak grad:   39.4 mmHg MV Peak grad:  13.0 mmHg     TR Vmax:        314.00 cm/s MV Mean  grad:  3.0 mmHg MV Vmax:       1.80 m/s      SHUNTS MV Vmean:      66.7 cm/s     Systemic VTI:  0.20 m MV Decel Time: 141 msec      Systemic Diam: 2.20 cm MR Peak grad:    128.1 mmHg MR Mean grad:    99.0 mmHg MR Vmax:         566.00 cm/s MR Vmean:        479.0 cm/s MR PISA:         1.57 cm MR PISA Eff ROA: 11 mm MR PISA Radius:  0.50 cm MV E velocity: 144.00 cm/s Fransico Him MD Electronically signed by Fransico Him MD Signature Date/Time: 11/15/2020/1:21:39 PM    Final       Subjective: ***   Discharge Exam: Vitals:   11/18/20 0028 11/18/20 0415   BP: 129/60 (!) 162/66  Pulse:  71  Resp: 19 20  Temp: (!) 97.5 F (36.4 C) 98.6 F (37 C)  SpO2: 93% 95%   Vitals:   11/17/20 1954 11/17/20 2000 11/18/20 0028 11/18/20 0415  BP: (!) 142/53  129/60 (!) 162/66  Pulse:  77  71  Resp: 18 18 19 20   Temp: 97.7 F (36.5 C)  (!) 97.5 F (36.4 C) 98.6 F (37 C)  TempSrc: Oral  Oral Oral  SpO2: 95% 95% 93% 95%    General: Pt is alert, awake, not in acute distress Cardiovascular: RRR, S1/S2 +, no rubs, no gallops Respiratory: CTA bilaterally, no wheezing, no rhonchi Abdominal: Soft, NT, ND, bowel sounds + Extremities: no edema, no cyanosis    The results of significant diagnostics from this hospitalization (including imaging, microbiology, ancillary and laboratory) are listed below for reference.     Microbiology: Recent Results (from the past 240 hour(s))  Resp Panel by RT-PCR (Flu A&B, Covid) Nasopharyngeal Swab     Status: Abnormal   Collection Time: 11/15/20  4:10 AM   Specimen: Nasopharyngeal Swab; Nasopharyngeal(NP) swabs in vial transport medium  Result Value Ref Range Status   SARS Coronavirus 2 by RT PCR POSITIVE (A) NEGATIVE Final    Comment: RESULT CALLED TO, READ BACK BY AND VERIFIED WITH: RN CHRIS CHRISCO AT 0602 BY MESSAN H. ON N1/08/2021 (NOTE) SARS-CoV-2 target nucleic acids are DETECTED.  The SARS-CoV-2 RNA is generally detectable in upper respiratory specimens during the acute phase of infection. Positive results are indicative of the presence of the identified virus, but do not rule out bacterial infection or co-infection with other pathogens not detected by the test. Clinical correlation with patient history and other diagnostic information is necessary to determine patient infection status. The expected result is Negative.  Fact Sheet for Patients: EntrepreneurPulse.com.au  Fact Sheet for Healthcare Providers: IncredibleEmployment.be  This test is not yet  approved or cleared by the [place] FDA and  has been authorized for detection and/or diagnosis of SARS-CoV-2 by FDA under an Emergency Use Authorization (EUA).  This EUA will remain in effect (meanin g this test can be used) for the duration of  the COVID-19 declaration under Section 564(b)(1) of the Act, 21 U.S.C. section 360bbb-3(b)(1), unless the authorization is terminated or revoked sooner.     Influenza A by PCR NEGATIVE NEGATIVE Final   Influenza B by PCR NEGATIVE NEGATIVE Final    Comment: (NOTE) The Xpert Xpress SARS-CoV-2/FLU/RSV plus assay is intended as an aid in the diagnosis of influenza from Nasopharyngeal swab specimens  and should not be used as a sole basis for treatment. Nasal washings and aspirates are unacceptable for Xpert Xpress SARS-CoV-2/FLU/RSV testing.  Fact Sheet for Patients: EntrepreneurPulse.com.au  Fact Sheet for Healthcare Providers: IncredibleEmployment.be  This test is not yet approved or cleared by the [place] FDA and has been authorized for detection and/or diagnosis of SARS-CoV-2 by FDA under an Emergency Use Authorization (EUA). This EUA will remain in effect (meaning this test can be used) for the duration of the COVID-19 declaration under Section 564(b)(1) of the Act, 21 U.S.C. section 360bbb-3(b)(1), unless the authorization is terminated or revoked.  Performed at [facility], [place] [address] [place], [place] [address]      Labs: BNP (last 3 results) No results for input(s): BNP in the last 8760 hours. Basic Metabolic Panel: Recent Labs  Lab 11/12/20 2120 11/14/20 1451 11/16/20 0500 11/17/20 0109 11/18/20 0040  NA 131* 127* 131* 131* 128*  K 3.6 3.5 3.8 4.1 3.9  CL 98 98 101 103 100  CO2 21* 18* 17* 17* 17*  GLUCOSE 167* 178* 238* 203* 184*  BUN 42* 35* 50* 59* 69*  CREATININE 3.09* 3.02* 3.32* 3.34* 3.44*  CALCIUM 8.6* 7.8* 8.1* 7.8* 7.6*  MG  --   --  1.7 1.7 1.7   PHOS  --   --  4.3 4.1 4.4   Liver Function Tests: Recent Labs  Lab 11/12/20 2120 11/14/20 1922 11/16/20 0500 11/17/20 0109 11/18/20 0040  AST 41 75* 88* 84* 81*  ALT 29 37 48* 51* 59*  ALKPHOS 55 38 46 46 46  BILITOT 0.6 0.7 0.3 0.5 0.6  PROT 6.8 5.5* 5.5* 5.2* 4.7*  ALBUMIN 3.4* 2.7* 2.6* 2.4* 2.3*   Recent Labs  Lab 11/14/20 1922  LIPASE 35   No results for input(s): AMMONIA in the last 168 hours. CBC: Recent Labs  Lab 11/12/20 2120 11/14/20 1451 11/16/20 0500 11/17/20 0109 11/18/20 0040  WBC 6.6 8.2 13.8* 13.0* 11.0*  NEUTROABS 4.1  --  11.9* 12.7* 9.3*  HGB 11.8* 10.4* 10.4* 9.3* 8.6*  HCT 34.6* 31.9* 30.2* 26.6* 24.9*  MCV 91.1 91.7 88.3 87.5 86.8  PLT 154 123* 178 190 225   Cardiac Enzymes: Recent Labs  Lab 11/14/20 2344 11/15/20 0249 11/16/20 0500 11/17/20 0109 11/18/20 0040  CKTOTAL 1,211* 1,191* 1,229* 1,159* 1,157*   BNP: Invalid input(s): POCBNP CBG: Recent Labs  Lab 11/17/20 1226 11/17/20 1513 11/17/20 1948 11/18/20 0022 11/18/20 0409  GLUCAP 195* 188* 227* 177* 177*   D-Dimer Recent Labs    11/17/20 0109 11/18/20 0040  DDIMER 2.01* 2.09*   Hgb A1c No results for input(s): HGBA1C in the last 72 hours. Lipid Profile No results for input(s): CHOL, HDL, LDLCALC, TRIG, CHOLHDL, LDLDIRECT in the last 72 hours. Thyroid function studies No results for input(s): TSH, T4TOTAL, T3FREE, THYROIDAB in the last 72 hours.  Invalid input(s): FREET3 Anemia work up Recent Labs    11/17/20 0109 11/18/20 0040  FERRITIN 365* 308*   Urinalysis    Component Value Date/Time   COLORURINE YELLOW 11/14/2020 2216   APPEARANCEUR HAZY (A) 11/14/2020 2216   LABSPEC 1.008 11/14/2020 2216   PHURINE 6.0 11/14/2020 2216   [place] 11/14/2020 2216   HGBUR MODERATE (A) 11/14/2020 2216   BILIRUBINUR NEGATIVE 11/14/2020 2216   BILIRUBINUR neg 02/15/2012 1523   [place] 11/14/2020 2216   PROTEINUR 100 (A) 11/14/2020 2216    UROBILINOGEN 0.2 11/21/2012 1035   NITRITE NEGATIVE 11/14/2020 2216   LEUKOCYTESUR  TRACE (A) 11/14/2020 2216   Sepsis Labs Invalid input(s): PROCALCITONIN,  WBC,  LACTICIDVEN Microbiology Recent Results (from the past 240 hour(s))  Resp Panel by RT-PCR (Flu A&B, Covid) Nasopharyngeal Swab     Status: Abnormal   Collection Time: 11/15/20  4:10 AM   Specimen: Nasopharyngeal Swab; Nasopharyngeal(NP) swabs in vial transport medium  Result Value Ref Range Status   SARS Coronavirus 2 by RT PCR POSITIVE (A) NEGATIVE Final    Comment: RESULT CALLED TO, READ BACK BY AND VERIFIED WITH: RN CHRIS CHRISCO AT 0602 BY MESSAN H. ON N1/08/2021 (NOTE) SARS-CoV-2 target nucleic acids are DETECTED.  The SARS-CoV-2 RNA is generally detectable in upper respiratory specimens during the acute phase of infection. Positive results are indicative of the presence of the identified virus, but do not rule out bacterial infection or co-infection with other pathogens not detected by the test. Clinical correlation with patient history and other diagnostic information is necessary to determine patient infection status. The expected result is Negative.  Fact Sheet for Patients: EntrepreneurPulse.com.au  Fact Sheet for Healthcare Providers: IncredibleEmployment.be  This test is not yet approved or cleared by the [place] FDA and  has been authorized for detection and/or diagnosis of SARS-CoV-2 by FDA under an Emergency Use Authorization (EUA).  This EUA will remain in effect (meanin g this test can be used) for the duration of  the COVID-19 declaration under Section 564(b)(1) of the Act, 21 U.S.C. section 360bbb-3(b)(1), unless the authorization is terminated or revoked sooner.     Influenza A by PCR NEGATIVE NEGATIVE Final   Influenza B by PCR NEGATIVE NEGATIVE Final    Comment: (NOTE) The Xpert Xpress SARS-CoV-2/FLU/RSV plus assay is intended as an aid in the  diagnosis of influenza from Nasopharyngeal swab specimens and should not be used as a sole basis for treatment. Nasal washings and aspirates are unacceptable for Xpert Xpress SARS-CoV-2/FLU/RSV testing.  Fact Sheet for Patients: EntrepreneurPulse.com.au  Fact Sheet for Healthcare Providers: IncredibleEmployment.be  This test is not yet approved or cleared by the [place] FDA and has been authorized for detection and/or diagnosis of SARS-CoV-2 by FDA under an Emergency Use Authorization (EUA). This EUA will remain in effect (meaning this test can be used) for the duration of the COVID-19 declaration under Section 564(b)(1) of the Act, 21 U.S.C. section 360bbb-3(b)(1), unless the authorization is terminated or revoked.  Performed at [facility], [place] [address], [place], [place] [address]      Time coordinating discharge: Over 30 minutes  SIGNED:   Little Ishikawa, DO [facility] 11/18/2020, 7:18 AM Pager   If 7PM-7AM, please contact night-coverage [email or web address]

## 2020-11-18 NOTE — Consult Note (Signed)
[facility] Renal Consultation Note  Requesting MD: Avon Gully Indication for Consultation: A on CRF  HPI:  Brenda Patterson is a 80 y.o. female with past medical history significant for DM, HTN, obesity as well as CKD-  Baseline crt of 2.3-2.6  Sees Dr. Johnney Ou at [facility]- last saw her 10/19/20 at which time crt was 2.4.  She tested positive for COVID on 1/2 but did not become overly symptomatic til later (was not vaccinated)-  Hospitalized 1/9 with PNA- given remdesivir/steroids.  Also discovered to have new decreased EF- cards seen- conservative management for now.  She has developed some A on CRF-  crt 3 on presentation, now up to 3.44.  UOP not well recorded, u/a on 1/9-  100 of prot, mod hgb but no cells. FeNa 1.2 % U/A negative for obstruction.  There have been no overt low BPs recorded.  Pt now on room air and approaching discharge but creatinine not leveling off so we are asked to see.  She seems confused to my exam-  Repeating things over and over.  She says she is eating normally and making good urine-  She wants to leave the hospital    Creat  Date/Time Value Ref Range Status  07/13/2020 11:42 AM 2.55 (H) 0.60 - 0.93 mg/dL Final    Comment:    For patients >47 years of age, the reference limit for Creatinine is approximately 13% higher for people identified as African-American. .    Creatinine, Ser  Date/Time Value Ref Range Status  11/18/2020 12:40 AM 3.44 (H) 0.44 - 1.00 mg/dL Final  11/17/2020 01:09 AM 3.34 (H) 0.44 - 1.00 mg/dL Final  11/16/2020 05:00 AM 3.32 (H) 0.44 - 1.00 mg/dL Final  11/14/2020 02:51 PM 3.02 (H) 0.44 - 1.00 mg/dL Final  11/12/2020 09:20 PM 3.09 (H) 0.44 - 1.00 mg/dL Final  06/16/2019 08:05 AM 2.22 (H) 0.40 - 1.20 mg/dL Final  05/27/2019 08:22 AM 2.30 (H) 0.40 - 1.20 mg/dL Final  11/22/2018 11:20 AM 1.91 (H) 0.40 - 1.20 mg/dL Final  11/04/2018 09:35 AM 2.06 (H) 0.40 - 1.20 mg/dL Final  11/05/2017 03:06 PM 1.63 (H) 0.44 - 1.00 mg/dL Final   12/01/2016 10:04 AM 1.47 (H) 0.40 - 1.20 mg/dL Final  10/23/2016 09:09 AM 1.56 (H) 0.40 - 1.20 mg/dL Final  06/12/2016 10:17 AM 1.36 (H) 0.40 - 1.20 mg/dL Final  05/01/2016 09:11 AM 1.53 (H) 0.40 - 1.20 mg/dL Final  10/26/2015 11:24 AM 1.42 (H) 0.40 - 1.20 mg/dL Final  04/14/2015 09:17 AM 1.32 (H) 0.40 - 1.20 mg/dL Final  12/26/2014 06:32 AM 1.19 (H) 0.50 - 1.10 mg/dL Final  12/26/2014 02:05 AM 1.21 (H) 0.50 - 1.10 mg/dL Final  04/03/2014 11:14 AM 1.3 (H) 0.4 - 1.2 mg/dL Final  04/03/2013 11:31 AM 1.2 0.4 - 1.2 mg/dL Final  11/21/2012 10:33 AM 0.88 0.50 - 1.10 mg/dL Final  02/09/2012 03:10 AM 1.13 (H) 0.50 - 1.10 mg/dL Final  09/01/2011 09:18 AM 1.3 (H) 0.4 - 1.2 mg/dL Final  07/07/2011 08:31 PM 0.86 0.50 - 1.10 mg/dL Final  03/02/2011 10:44 AM 1.0 0.4 - 1.2 mg/dL Final  03/25/2010 09:27 AM 0.9 0.4 - 1.2 mg/dL Final  12/12/2008 05:45 AM 0.91 0.4 - 1.2 mg/dL Final  12/11/2008 06:00 AM 0.90 0.4 - 1.2 mg/dL Final  12/10/2008 10:01 PM 0.83 0.4 - 1.2 mg/dL Final  04/19/2007 03:06 PM 1.2  Final     PMHx:   Past Medical History:  Diagnosis Date  . Allergy   .  Arthritis   . 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 Diabetes mellitus   . Hyperlipidemia   . Hypertension   . LBBB (left bundle branch block) 11/15/2020  . Migraines     Past Surgical History:  Procedure Laterality Date  . CHOLECYSTECTOMY    . TUBAL LIGATION      Family Hx:  Family History  Problem Relation Age of Onset  . Arthritis Other        fhx  . Hyperlipidemia Other        fhx  . Hypertension Other        fhx  . Diabetes Other        fhx  . Stroke Other        fhx  . Breast cancer Sister     Social History:  reports that she has never smoked. She has never used smokeless tobacco. She reports that she does not drink alcohol and does not use drugs.  Allergies:  Allergies  Allergen Reactions  . Azithromycin Nausea Only  . Clarithromycin Other (See Comments)  . Heparin      Patient states it makes her weak  . Penicillins Rash    Rash noted inside her mouth and lips    Medications: Prior to Admission medications   Medication Sig Start Date End Date Taking? Authorizing Provider  albuterol (VENTOLIN HFA) 108 (90 Base) MCG/ACT inhaler Inhale 2 puffs into the lungs every 6 (six) hours as needed for wheezing or shortness of breath. 08/10/20 09/09/20 Yes Burchette, Alinda Sierras, MD  Ascorbic Acid (VITAMIN C) 1000 MG tablet Take 1,000 mg by mouth daily.   Yes [provider]  aspirin EC 81 MG tablet Take 81 mg by mouth daily.   Yes [provider]  Calcium Carbonate (CALCIUM 600 PO) Take 600 mg by mouth 2 (two) times daily.    Yes [provider]  carvedilol (COREG) 6.25 MG tablet TAKE 1 TABLET BY MOUTH  TWICE DAILY Patient taking differently: Take 6.25 mg by mouth 2 (two) times daily with a meal. 06/23/20  Yes Burchette, Alinda Sierras, MD  chlorthalidone (HYGROTON) 25 MG tablet Take 25 mg by mouth daily. 10/14/20  Yes [provider]  Cholecalciferol (VITAMIN D) 50 MCG (2000 UT) tablet Take 2,000 Units by mouth daily.   Yes [provider]  fluticasone (FLONASE) 50 MCG/ACT nasal spray Place 2 sprays into both nostrils daily. Patient taking differently: Place 2 sprays into both nostrils daily as needed for allergies. 11/05/17  Yes Noemi Chapel, MD  glipiZIDE (GLUCOTROL XL) 2.5 MG 24 hr tablet TAKE 1 TABLET BY MOUTH  DAILY WITH BREAKFAST Patient taking differently: Take 2.5 mg by mouth daily with breakfast. 06/23/20  Yes Burchette, Alinda Sierras, MD  hydrochlorothiazide (HYDRODIURIL) 25 MG tablet TAKE 1 TABLET BY MOUTH  DAILY Patient taking differently: Take 25 mg by mouth daily. 06/23/20  Yes Burchette, Alinda Sierras, MD  levothyroxine (SYNTHROID) 25 MCG tablet TAKE 1 TABLET BY MOUTH ONCE DAILY WITH BREAKFAST Patient taking differently: Take 25 mcg by mouth daily before breakfast. 08/18/20  Yes Burchette, Alinda Sierras, MD  lisinopril (ZESTRIL) 20 MG tablet  TAKE ONE-HALF TABLET BY  MOUTH DAILY Patient taking differently: Take 10 mg by mouth daily. 09/29/20  Yes Burchette, Alinda Sierras, MD  loperamide (IMODIUM A-D) 2 MG tablet Take 1 tablet (2 mg total) by mouth 4 (four) times daily as needed for diarrhea or loose stools. 11/12/20  Yes Fredia Sorrow,  MD  Multiple Vitamin (MULTIVITAMIN) tablet Take 1 tablet by mouth daily.   Yes [provider]  NIFEdipine (PROCARDIA-XL/NIFEDICAL-XL) 30 MG 24 hr tablet TAKE 1 TABLET BY MOUTH  TWICE DAILY Patient taking differently: Take 30 mg by mouth in the morning and at bedtime. 06/23/20  Yes Burchette, Alinda Sierras, MD  ondansetron (ZOFRAN) 4 MG tablet Take 1 tablet (4 mg total) by mouth every 8 (eight) hours as needed for nausea or vomiting. 11/09/20  Yes Colin Benton R, DO  pyridOXINE (VITAMIN B-6) 100 MG tablet Take 100 mg by mouth daily.   Yes [provider]  rosuvastatin (CRESTOR) 20 MG tablet Take 1 tablet (20 mg total) by mouth daily. 08/10/20  Yes Burchette, Alinda Sierras, MD  sertraline (ZOLOFT) 50 MG tablet TAKE 1 TABLET BY MOUTH  DAILY Patient taking differently: Take 50 mg by mouth daily. 08/18/20  Yes Burchette, Alinda Sierras, MD  Zinc 220 (50 Zn) MG CAPS Take 220 mg by mouth daily.   Yes [provider]    I have reviewed the patient's current medications.  Labs:  Results for orders placed or performed during the hospital encounter of 11/14/20 (from the past 48 hour(s))  CBG monitoring, ED     Status: Abnormal   Collection Time: 11/16/20  4:06 PM  Result Value Ref Range   Glucose-Capillary 178 (H) 70 - 99 mg/dL    Comment: Glucose reference range applies only to samples taken after fasting for at least 8 hours.  Urine culture     Status: Abnormal   Collection Time: 11/16/20  5:34 PM   Specimen: Urine, Random  Result Value Ref Range   Specimen Description URINE, RANDOM    Special Requests      NONE Performed at [facility], [address], [place], [place] [address]    Culture  MULTIPLE SPECIES PRESENT, SUGGEST RECOLLECTION (A)    Report Status 11/18/2020 FINAL   Glucose, capillary     Status: Abnormal   Collection Time: 11/16/20  8:12 PM  Result Value Ref Range   Glucose-Capillary 202 (H) 70 - 99 mg/dL    Comment: Glucose reference range applies only to samples taken after fasting for at least 8 hours.   Comment 1 Notify RN    Comment 2 Document in Chart   Glucose, capillary     Status: Abnormal   Collection Time: 11/17/20 12:20 AM  Result Value Ref Range   Glucose-Capillary 176 (H) 70 - 99 mg/dL    Comment: Glucose reference range applies only to samples taken after fasting for at least 8 hours.   Comment 1 Notify RN    Comment 2 Document in Chart   CBC with Differential/Platelet     Status: Abnormal   Collection Time: 11/17/20  1:09 AM  Result Value Ref Range   WBC 13.0 (H) 4.0 - 10.5 K/uL   RBC 3.04 (L) 3.87 - 5.11 MIL/uL   Hemoglobin 9.3 (L) 12.0 - 15.0 g/dL   HCT 26.6 (L) 36.0 - 46.0 %   MCV 87.5 80.0 - 100.0 fL   MCH 30.6 26.0 - 34.0 pg   MCHC 35.0 30.0 - 36.0 g/dL   RDW 12.4 11.5 - 15.5 %   Platelets 190 150 - 400 K/uL   nRBC 0.0 0.0 - 0.2 %   Neutrophils Relative % 98 %   Neutro Abs 12.7 (H) 1.7 - 7.7 K/uL   Lymphocytes Relative 1 %   Lymphs Abs 0.1 (L) 0.7 - 4.0  K/uL   Monocytes Relative 1 %   Monocytes Absolute 0.1 0.1 - 1.0 K/uL   Eosinophils Relative 0 %   Eosinophils Absolute 0.0 0.0 - 0.5 K/uL   Basophils Relative 0 %   Basophils Absolute 0.0 0.0 - 0.1 K/uL   nRBC 0 0 /100 WBC   Abs Immature Granulocytes 0.00 0.00 - 0.07 K/uL    Comment: Performed at [facility], [place] [address], [place], [place] [address]  Comprehensive metabolic panel     Status: Abnormal   Collection Time: 11/17/20  1:09 AM  Result Value Ref Range   Sodium 131 (L) 135 - 145 mmol/L   Potassium 4.1 3.5 - 5.1 mmol/L   Chloride 103 98 - 111 mmol/L   CO2 17 (L) 22 - 32 mmol/L   Glucose, Bld 203 (H) 70 - 99 mg/dL    Comment: Glucose reference range applies  only to samples taken after fasting for at least 8 hours.   BUN 59 (H) 8 - 23 mg/dL   Creatinine, Ser 3.34 (H) 0.44 - 1.00 mg/dL   Calcium 7.8 (L) 8.9 - 10.3 mg/dL   Total Protein 5.2 (L) 6.5 - 8.1 g/dL   Albumin 2.4 (L) 3.5 - 5.0 g/dL   AST 84 (H) 15 - 41 U/L   ALT 51 (H) 0 - 44 U/L   Alkaline Phosphatase 46 38 - 126 U/L   Total Bilirubin 0.5 0.3 - 1.2 mg/dL   GFR, Estimated 14 (L) >60 mL/min    Comment: (NOTE) Calculated using the CKD-EPI Creatinine Equation (2021)    Anion gap 11 5 - 15    Comment: Performed at [facility] [address], [place], [place] [address]  C-reactive protein     Status: Abnormal   Collection Time: 11/17/20  1:09 AM  Result Value Ref Range   CRP 2.9 (H) <1.0 mg/dL    Comment: Performed at [facility] [address], [place], [place] [address]  D-dimer, quantitative (not at [facility])     Status: Abnormal   Collection Time: 11/17/20  1:09 AM  Result Value Ref Range   D-Dimer, Quant 2.01 (H) 0.00 - 0.50 ug/mL-FEU    Comment: (NOTE) At the manufacturer cut-off value of 0.5 g/mL FEU, this assay has a negative predictive value of 95-100%.This assay is intended for use in conjunction with a clinical pretest probability (PTP) assessment model to exclude pulmonary embolism (PE) and deep venous thrombosis (DVT) in outpatients suspected of PE or DVT. Results should be correlated with clinical presentation. Performed at [facility], [place] [address], [place], [place] [address]   Ferritin     Status: Abnormal   Collection Time: 11/17/20  1:09 AM  Result Value Ref Range   Ferritin 365 (H) 11 - 307 ng/mL    Comment: Performed at [facility], [place] [address], [place], [place] [address]  Magnesium     Status: None   Collection Time: 11/17/20  1:09 AM  Result Value Ref Range   Magnesium 1.7 1.7 - 2.4 mg/dL    Comment: Performed at [facility] [address] [place], [place] [address]  Phosphorus     Status: None   Collection  Time: 11/17/20  1:09 AM  Result Value Ref Range   Phosphorus 4.1 2.5 - 4.6 mg/dL    Comment: Performed at [facility] [address], [place], [place] [address]  CK     Status:  Abnormal   Collection Time: 11/17/20  1:09 AM  Result Value Ref Range   Total CK 1,159 (H) 38 - 234 U/L    Comment: Performed at [facility], [place] [address], [place], [place] [address]  Glucose, capillary     Status: Abnormal   Collection Time: 11/17/20  3:26 AM  Result Value Ref Range   Glucose-Capillary 167 (H) 70 - 99 mg/dL    Comment: Glucose reference range applies only to samples taken after fasting for at least 8 hours.   Comment 1 Notify RN    Comment 2 Document in Chart   Glucose, capillary     Status: Abnormal   Collection Time: 11/17/20  7:45 AM  Result Value Ref Range   Glucose-Capillary 215 (H) 70 - 99 mg/dL    Comment: Glucose reference range applies only to samples taken after fasting for at least 8 hours.  Glucose, capillary     Status: Abnormal   Collection Time: 11/17/20 12:26 PM  Result Value Ref Range   Glucose-Capillary 195 (H) 70 - 99 mg/dL    Comment: Glucose reference range applies only to samples taken after fasting for at least 8 hours.  Glucose, capillary     Status: Abnormal   Collection Time: 11/17/20  3:13 PM  Result Value Ref Range   Glucose-Capillary 188 (H) 70 - 99 mg/dL    Comment: Glucose reference range applies only to samples taken after fasting for at least 8 hours.  Glucose, capillary     Status: Abnormal   Collection Time: 11/17/20  7:48 PM  Result Value Ref Range   Glucose-Capillary 227 (H) 70 - 99 mg/dL    Comment: Glucose reference range applies only to samples taken after fasting for at least 8 hours.  Glucose, capillary     Status: Abnormal   Collection Time: 11/18/20 12:22 AM  Result Value Ref Range   Glucose-Capillary 177 (H) 70 - 99 mg/dL    Comment: Glucose reference range applies only to samples taken after fasting for at least 8 hours.   CBC with Differential/Platelet     Status: Abnormal   Collection Time: 11/18/20 12:40 AM  Result Value Ref Range   WBC 11.0 (H) 4.0 - 10.5 K/uL   RBC 2.87 (L) 3.87 - 5.11 MIL/uL   Hemoglobin 8.6 (L) 12.0 - 15.0 g/dL   HCT 24.9 (L) 36.0 - 46.0 %   MCV 86.8 80.0 - 100.0 fL   MCH 30.0 26.0 - 34.0 pg   MCHC 34.5 30.0 - 36.0 g/dL   RDW 12.4 11.5 - 15.5 %   Platelets 225 150 - 400 K/uL   nRBC 0.0 0.0 - 0.2 %   Neutrophils Relative % 84 %   Neutro Abs 9.3 (H) 1.7 - 7.7 K/uL   Lymphocytes Relative 9 %   Lymphs Abs 1.0 0.7 - 4.0 K/uL   Monocytes Relative 5 %   Monocytes Absolute 0.5 0.1 - 1.0 K/uL   Eosinophils Relative 0 %   Eosinophils Absolute 0.0 0.0 - 0.5 K/uL   Basophils Relative 0 %   Basophils Absolute 0.0 0.0 - 0.1 K/uL   Immature Granulocytes 2 %   Abs Immature Granulocytes 0.21 (H) 0.00 - 0.07 K/uL    Comment: Performed at [facility], [address], [place],  [address]  Comprehensive metabolic panel     Status: Abnormal   Collection Time: 11/18/20 12:40 AM  Result Value Ref Range   Sodium 128 (L) 135 -  145 mmol/L   Potassium 3.9 3.5 - 5.1 mmol/L   Chloride 100 98 - 111 mmol/L   CO2 17 (L) 22 - 32 mmol/L   Glucose, Bld 184 (H) 70 - 99 mg/dL    Comment: Glucose reference range applies only to samples taken after fasting for at least 8 hours.   BUN 69 (H) 8 - 23 mg/dL   Creatinine, Ser 3.44 (H) 0.44 - 1.00 mg/dL   Calcium 7.6 (L) 8.9 - 10.3 mg/dL   Total Protein 4.7 (L) 6.5 - 8.1 g/dL   Albumin 2.3 (L) 3.5 - 5.0 g/dL   AST 81 (H) 15 - 41 U/L   ALT 59 (H) 0 - 44 U/L   Alkaline Phosphatase 46 38 - 126 U/L   Total Bilirubin 0.6 0.3 - 1.2 mg/dL   GFR, Estimated 13 (L) >60 mL/min    Comment: (NOTE) Calculated using the CKD-EPI Creatinine Equation (2021)    Anion gap 11 5 - 15    Comment: Performed at [facility] [address], [place], [place] [address]  C-reactive protein     Status: Abnormal   Collection Time: 11/18/20 12:40 AM  Result Value  Ref Range   CRP 1.6 (H) <1.0 mg/dL    Comment: Performed at [facility] [address], [place], [place] [address]  D-dimer, quantitative (not at [facility])     Status: Abnormal   Collection Time: 11/18/20 12:40 AM  Result Value Ref Range   D-Dimer, Quant 2.09 (H) 0.00 - 0.50 ug/mL-FEU    Comment: (NOTE) At the manufacturer cut-off value of 0.5 g/mL FEU, this assay has a negative predictive value of 95-100%.This assay is intended for use in conjunction with a clinical pretest probability (PTP) assessment model to exclude pulmonary embolism (PE) and deep venous thrombosis (DVT) in outpatients suspected of PE or DVT. Results should be correlated with clinical presentation. Performed at [facility], [place] [address] [place], [place] [address]   Ferritin     Status: Abnormal   Collection Time: 11/18/20 12:40 AM  Result Value Ref Range   Ferritin 308 (H) 11 - 307 ng/mL    Comment: Performed at [facility], [place] [address], [place], [place] [address]  Magnesium     Status: None   Collection Time: 11/18/20 12:40 AM  Result Value Ref Range   Magnesium 1.7 1.7 - 2.4 mg/dL    Comment: Performed at [facility] [address] [place], [place] [address]  Phosphorus     Status: None   Collection Time: 11/18/20 12:40 AM  Result Value Ref Range   Phosphorus 4.4 2.5 - 4.6 mg/dL    Comment: Performed at [facility] [address] [place], [place] [address]  CK     Status: Abnormal   Collection Time: 11/18/20 12:40 AM  Result Value Ref Range   Total CK 1,157 (H) 38 - 234 U/L    Comment: Performed at [facility], [place] [address] [place], [place] [address]  Glucose, capillary     Status: Abnormal   Collection Time: 11/18/20  4:09 AM  Result Value Ref Range   Glucose-Capillary 177 (H) 70 - 99 mg/dL    Comment: Glucose reference range applies only to samples taken after fasting for at least 8 hours.   Comment 1 Notify RN    Comment 2 Document in Chart    Glucose, capillary     Status: Abnormal  Collection Time: 11/18/20  7:44 AM  Result Value Ref Range   Glucose-Capillary 174 (H) 70 - 99 mg/dL    Comment: Glucose reference range applies only to samples taken after fasting for at least 8 hours.  Glucose, capillary     Status: Abnormal   Collection Time: 11/18/20 11:37 AM  Result Value Ref Range   Glucose-Capillary 264 (H) 70 - 99 mg/dL    Comment: Glucose reference range applies only to samples taken after fasting for at least 8 hours.     ROS:  A comprehensive review of systems was negative.  However, I believe the patient is some confused  Physical Exam: Vitals:   11/18/20 0754 11/18/20 1141  BP: (!) 162/69 140/61  Pulse: 74 76  Resp: 15 18  Temp: 97.8 F (36.6 C) 97.6 F (36.4 C)  SpO2: 94% 90%     General: obese WF-  Slightly distracted - repeating things over and over-  Husband at bedside-  Mobility not great  HEENT: PERRLA, EOMI- mucous membranes moist  Neck: no JVD Heart: RRR Lungs: CBS bilat-  No O2 req  Abdomen: obese, soft, non tender Extremities: no peripheral edema Neuro: some repeating of phrases  Assessment/Plan: 80 year old WF-   DM, HTN and CKD-  Now with covid and new dec EF-  A on CRF as a result 1.Renal- baseline CKD-   crt 2.3-.2.6 felt due to HTN and DM-  Now with some A on CRF in the setting of covid and steroids-  crt last 3 days 3.3, 3.3 and 3.4-  Relatively stable but not plateaued to decreased yet.  We have seen this in [place].  BUN likely climbing due to the steroids.  I have told her and my recommendation would be to keep in house until we see crt start to fall.  I also think she is not at her baseline mentally or physically at this point.  Urine and u/s not indicative of additional problem other than COVID and volume depletion as cause for A on chronic kidney disease.  OP lisinopril on hold 2. Hypertension/volume  - she does not appear overloaded or dry at this time-  PO intake, OP diuretics on  hold 3.  New dec EF-  Per cardiology - medical management for now 4. Anemia  - decreasing , situational-  No action at this time, supportive care   Louis Meckel 11/18/2020, 2:37 PM

## 2020-11-18 NOTE — Progress Notes (Signed)
PROGRESS NOTE  Brenda Patterson ZRA:[id number] DOB: 05-16-1941 DOA: 11/14/2020 PCP: [name], MD  Brief History   80 year old woman PMH CKD, diabetes mellitus presented with nausea, vomiting, diarrhea for 1 week, symptomatic from Covid test + January 2 but no respiratory symptoms.  Admitted for multifocal pneumonia secondary to COVID-19 with associated nausea vomiting and diarrhea, dehydration, rhabdomyolysis, elevated troponin, new left bundle branch block and wall motion abnormalities concerning for myocarditis or ischemia.  Seen by cardiology not a candidate for cath secondary to renal insufficiency.  Renal function failed to improve with fluids, nephrology consulted.  Elevated troponin with new left bundle branch block, new left ventricular systolic dysfunction -- Remains asymptomatic, no hypoxia.  Differential includes myopericarditis in the setting of COVID.  ACS doubted. -- Continue management per cardiology.  Beta-blocker, consider hydralazine and nitrates.  -- Not a candidate for ACE inhibitor secondary to renal insufficiency.  COVID-19 multifocal pneumonia with associated nausea, vomiting diarrhea -- Remains in symptomatic despite chest x-ray findings, no hypoxia.  Inflammatory markers without significant change today.  . CXR on admit: multifocal pneumonia . Fever: none . Oxygen requirement: none . Antibiotics: none, procalcitonin .16  . Remdesivir: 1/9 > . Steroids: 1/9 >  . Actemra/baricitinib: not indicated . Vitamin C and Zinc . Proning: recommended Recent Labs    11/16/20 0500 11/17/20 0109 11/18/20 0040  DDIMER 2.30* 2.01* 2.09*  FERRITIN 468* 365* 308*  CRP 5.4* 2.9* 1.6*   Elevated CK --no hx or trauma to suggest rhabdo, suspect elevated secondary to COVID/dehydration, however patient was on statin which has been discontinued. --CK repeat downtrending slowly  AKI superimposed on CKD stage IIIa -- Thought secondary to nausea vomiting and diarrhea.   FENa suggests intrinsic likely 2/2 poor PO intake, nausea/vomiting/diarrhea from covid. -- Listed medications at home include chlorthalidone, hydrochlorothiazide -- Increase PO intake today - long talk about increased water intake -- Renal ultrasound nonacute -- Nephrology following - likely in the setting of covid infection superimposed on pre-renal volume depletion.  Hyponatremia, hypovolemic -- Low bicarb likely secondary to GI losses -- Modest, asymptomatic.  Diabetes mellitus type 2 -- CBG remains stable.  Glipizide on hold.  Continue sliding scale insulin.  Anemia of chronic disease --Stable Disposition Plan:  Discussion: as above  Dispo: The patient is from: Home              Anticipated d/c is to: Home              Anticipated d/c date is: 24-48h              Patient currently is not medically stable to d/c.  DVT prophylaxis: SCDs Start: 11/15/20 5456   Code Status: Full Code Family Communication: none  Holli Humbles DO  [facility] Direct contact: Epic/secure chat  7PM-7AM contact night coverage as at bottom of note 11/18/2020, 12:39 PM  LOS: 3 days     Consults:  . Nephrology  . Cardiology  Interval History/Subjective  No acute issues or events overnight, continues to fixate on discharge home, husband at bedside. Patient distraught about possibly spending another night in the hospital - some concern for hospital delirium; however, she continues to answer orientation questions appropriately.   Objective   Vitals:  Vitals:   11/18/20 0754 11/18/20 1141  BP: (!) 162/69 140/61  Pulse: 74 76  Resp: 15 18  Temp: 97.8 F (36.6 C) 97.6 F (36.4 C)  SpO2: 94% 90%    Exam: Constitutional:   .  Anxious, agitated, requesting discharge home in tears. ENMT:  . grossly normal hearing  Respiratory:  . CTA bilaterally, no w/r/r.  . Respiratory effort normal.  Cardiovascular:  . RRR, no m/r/g . No LE extremity edema   Psychiatric:  . Mental  status o Mood, affect appropriate  Scheduled Meds: . albuterol  2 puff Inhalation Q6H  . aspirin EC  81 mg Oral Daily  . carvedilol  6.25 mg Oral BID  . hydrALAZINE  50 mg Oral TID  . insulin aspart  0-9 Units Subcutaneous Q4H  . isosorbide mononitrate  30 mg Oral Daily  . levothyroxine  25 mcg Oral QAC breakfast  . predniSONE  50 mg Oral Daily  . sertraline  50 mg Oral Daily  . sodium chloride flush  3 mL Intravenous Q12H    Principal Problem:   Pneumonia due to COVID-19 virus Active Problems:   Type 2 diabetes mellitus, controlled (HCC)   Hyperlipidemia   Essential hypertension   Obesity (BMI 30-39.9)   Nausea vomiting and diarrhea   Anemia of chronic disease   CKD (chronic kidney disease) stage 3, GFR 30-59 ml/min (HCC)   Elevated troponin   Abnormal ECG   AKI (acute kidney injury) (HCC)   LBBB (left bundle branch block)   LOS: 3 days

## 2020-11-19 DIAGNOSIS — J1282 Pneumonia due to coronavirus disease 2019: Secondary | ICD-10-CM | POA: Diagnosis not present

## 2020-11-19 DIAGNOSIS — U071 COVID-19: Secondary | ICD-10-CM | POA: Diagnosis not present

## 2020-11-19 LAB — CBC WITH DIFFERENTIAL/PLATELET
Abs Immature Granulocytes: 0.22 10*3/uL — ABNORMAL HIGH (ref 0.00–0.07)
Basophils Absolute: 0 10*3/uL (ref 0.0–0.1)
Basophils Relative: 0 %
Eosinophils Absolute: 0 10*3/uL (ref 0.0–0.5)
Eosinophils Relative: 0 %
HCT: 25.9 % — ABNORMAL LOW (ref 36.0–46.0)
Hemoglobin: 9.1 g/dL — ABNORMAL LOW (ref 12.0–15.0)
Immature Granulocytes: 2 %
Lymphocytes Relative: 12 %
Lymphs Abs: 1.1 10*3/uL (ref 0.7–4.0)
MCH: 30.6 pg (ref 26.0–34.0)
MCHC: 35.1 g/dL (ref 30.0–36.0)
MCV: 87.2 fL (ref 80.0–100.0)
Monocytes Absolute: 0.7 10*3/uL (ref 0.1–1.0)
Monocytes Relative: 7 %
Neutro Abs: 7.4 10*3/uL (ref 1.7–7.7)
Neutrophils Relative %: 79 %
Platelets: 249 10*3/uL (ref 150–400)
RBC: 2.97 MIL/uL — ABNORMAL LOW (ref 3.87–5.11)
RDW: 12.3 % (ref 11.5–15.5)
WBC: 9.5 10*3/uL (ref 4.0–10.5)
nRBC: 0 % (ref 0.0–0.2)

## 2020-11-19 LAB — COMPREHENSIVE METABOLIC PANEL
ALT: 56 U/L — ABNORMAL HIGH (ref 0–44)
AST: 60 U/L — ABNORMAL HIGH (ref 15–41)
Albumin: 2.3 g/dL — ABNORMAL LOW (ref 3.5–5.0)
Alkaline Phosphatase: 46 U/L (ref 38–126)
Anion gap: 11 (ref 5–15)
BUN: 75 mg/dL — ABNORMAL HIGH (ref 8–23)
CO2: 17 mmol/L — ABNORMAL LOW (ref 22–32)
Calcium: 7.8 mg/dL — ABNORMAL LOW (ref 8.9–10.3)
Chloride: 99 mmol/L (ref 98–111)
Creatinine, Ser: 3.41 mg/dL — ABNORMAL HIGH (ref 0.44–1.00)
GFR, Estimated: 13 mL/min — ABNORMAL LOW (ref >60)
Glucose, Bld: 198 mg/dL — ABNORMAL HIGH (ref 70–99)
Potassium: 4 mmol/L (ref 3.5–5.1)
Sodium: 127 mmol/L — ABNORMAL LOW (ref 135–145)
Total Bilirubin: 0.9 mg/dL (ref 0.3–1.2)
Total Protein: 4.7 g/dL — ABNORMAL LOW (ref 6.5–8.1)

## 2020-11-19 LAB — FERRITIN: Ferritin: 293 ng/mL (ref 11–307)

## 2020-11-19 LAB — GLUCOSE, CAPILLARY
Glucose-Capillary: 141 mg/dL — ABNORMAL HIGH (ref 70–99)
Glucose-Capillary: 153 mg/dL — ABNORMAL HIGH (ref 70–99)
Glucose-Capillary: 177 mg/dL — ABNORMAL HIGH (ref 70–99)
Glucose-Capillary: 189 mg/dL — ABNORMAL HIGH (ref 70–99)
Glucose-Capillary: 206 mg/dL — ABNORMAL HIGH (ref 70–99)
Glucose-Capillary: 235 mg/dL — ABNORMAL HIGH (ref 70–99)
Glucose-Capillary: 241 mg/dL — ABNORMAL HIGH (ref 70–99)

## 2020-11-19 LAB — PHOSPHORUS: Phosphorus: 4.2 mg/dL (ref 2.5–4.6)

## 2020-11-19 LAB — OSMOLALITY, URINE: Osmolality, Ur: 411 mOsm/kg (ref 300–900)

## 2020-11-19 LAB — C-REACTIVE PROTEIN: CRP: 1.1 mg/dL — ABNORMAL HIGH (ref <1.0)

## 2020-11-19 LAB — OSMOLALITY: Osmolality: 290 mOsm/kg (ref 275–295)

## 2020-11-19 LAB — MAGNESIUM: Magnesium: 1.8 mg/dL (ref 1.7–2.4)

## 2020-11-19 LAB — NA AND K (SODIUM & POTASSIUM), RAND UR
Potassium Urine: 13 mmol/L
Sodium, Ur: 57 mmol/L

## 2020-11-19 LAB — D-DIMER, QUANTITATIVE: D-Dimer, Quant: 2.4 ug/mL-FEU — ABNORMAL HIGH (ref 0.00–0.50)

## 2020-11-19 MED ORDER — ALUM & MAG HYDROXIDE-SIMETH 200-200-20 MG/5ML PO SUSP
15.0000 mL | Freq: Every evening | ORAL | Status: DC | PRN
  Administered 2020-11-19: 15 mL via ORAL
  Filled 2020-11-19: qty 30

## 2020-11-19 MED ORDER — PROMETHAZINE HCL 25 MG PO TABS
12.5000 mg | ORAL_TABLET | Freq: Four times a day (QID) | ORAL | Status: DC | PRN
  Administered 2020-11-19 – 2020-11-20 (×2): 12.5 mg via ORAL
  Filled 2020-11-19 (×4): qty 1

## 2020-11-19 NOTE — Progress Notes (Signed)
[facility] Progress Note    Assessment/ Plan:   80 year old WF-   DM, HTN and CKD-  Now with covid and new dec EF-  A on CRF as a result  1. Non-oliguric AKI on CKD4, stable. 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 Follows with me at our office (have only seen her once in the past). Baseline Cr around 2.3-2.6. -AKI likely related to COVID in itself and vol depletion -Seems like she's in the plateau phase in regards to her AKI, time will tell where her kidney function settles out to -Agree with holding ACEI and diuretics for now. Would recommend spot diuresis moving forward -renal u/s without obstruction -check CK level (+moderate blood on dipstick but only 0-5 rbcs/hpf), repeating UA -No indications for renal replacement therapy at this time -rising BUN likely from steroids -Avoid nephrotoxic medications including NSAIDs and iodinated intravenous contrast exposure unless the latter is absolutely indicated.  Preferred narcotic agents for pain control are hydromorphone, fentanyl, and methadone. Morphine should not be used. Avoid Baclofen and avoid oral sodium phosphate and magnesium citrate based laxatives / bowel preps. Continue strict Input and Output monitoring. Will monitor the patient closely with you and intervene or adjust therapy as indicated by changes in clinical status/labs  2. Hypertension/volume  -OP diuretics on hold 3.  New dec EF-  Per cardiology - medical management for now 4. Anemia  - stable this am. Transfuse prn for hgb <7 5. Hyponatremia -likely related to AKI and free water retention -fluid restriction, 1.5L/day ordered -sosm, uosm, ulytes ordered -monitor daily -encourage solute intake  Subjective:   Due to the COVID pandemic, in attempts to limit transmission and over-utilization of resources (e.g. PPE), the patient was seen virtually today by means of chart review, discussion with staff, and virtual discussion with patient as needed.  Talked to  patient over the phone. Not speaking in full sentences. She reports that she has not been eating as well. She is also asking if there is anything I can do to get her out of here.   Objective:   BP (!) 132/94 (BP Location: Left Arm)   Pulse 72   Temp 97.6 F (36.4 C) (Oral)   Resp 17   SpO2 95%   Intake/Output Summary (Last 24 hours) at 11/19/2020 1228 Last data filed at 11/19/2020 0300 Gross per 24 hour  Intake --  Output 950 ml  Net -950 ml   Weight change:   Physical Exam: Not examined----virtual visit.  Imaging: No results found.  Labs: BMET Recent Labs  Lab 11/12/20 2120 11/14/20 1451 11/16/20 0500 11/17/20 0109 11/18/20 0040 11/18/20 1326 11/19/20 0135  NA 131* 127* 131* 131* 128* 127* 127*  K 3.6 3.5 3.8 4.1 3.9 3.8 4.0  CL 98 98 101 103 100 99 99  CO2 21* 18* 17* 17* 17* 17* 17*  GLUCOSE 167* 178* 238* 203* 184* 255* 198*  BUN 42* 35* 50* 59* 69* 74* 75*  CREATININE 3.09* 3.02* 3.32* 3.34* 3.44* 3.41* 3.41*  CALCIUM 8.6* 7.8* 8.1* 7.8* 7.6* 7.4* 7.8*  PHOS  --   --  4.3 4.1 4.4  --  4.2   CBC Recent Labs  Lab 11/16/20 0500 11/17/20 0109 11/18/20 0040 11/19/20 0135  WBC 13.8* 13.0* 11.0* 9.5  NEUTROABS 11.9* 12.7* 9.3* 7.4  HGB 10.4* 9.3* 8.6* 9.1*  HCT 30.2* 26.6* 24.9* 25.9*  MCV 88.3 87.5 86.8 87.2  PLT 178 190 225 249    Medications:    .  aspirin EC  81 mg Oral Daily  . carvedilol  6.25 mg Oral BID  . hydrALAZINE  50 mg Oral TID  . insulin aspart  0-9 Units Subcutaneous Q4H  . isosorbide mononitrate  30 mg Oral Daily  . levothyroxine  25 mcg Oral QAC breakfast  . predniSONE  50 mg Oral Daily  . sertraline  50 mg Oral Daily  . sodium chloride flush  3 mL Intravenous Q12H      Gean Quint, MD [facility] 11/19/2020, 12:28 PM

## 2020-11-19 NOTE — Progress Notes (Addendum)
Progress Note  Due to the COVID-19 pandemic, this visit was completed with telemedicine (audio/video) technology to reduce patient and provider exposure as well as to preserve personal protective equipment.   Patient Name: Brenda Patterson Date of Encounter: 11/19/2020  Primary Cardiologist: Dr. Irish Lack  Subjective   Feeling jittery and anxious. No chest pain or dyspnea.   Inpatient Medications    Scheduled Meds: . aspirin EC  81 mg Oral Daily  . carvedilol  6.25 mg Oral BID  . hydrALAZINE  50 mg Oral TID  . insulin aspart  0-9 Units Subcutaneous Q4H  . isosorbide mononitrate  30 mg Oral Daily  . levothyroxine  25 mcg Oral QAC breakfast  . predniSONE  50 mg Oral Daily  . sertraline  50 mg Oral Daily  . sodium chloride flush  3 mL Intravenous Q12H   Continuous Infusions:  PRN Meds: acetaminophen, albuterol, calcium carbonate, guaiFENesin-dextromethorphan, HYDROcodone-acetaminophen, ondansetron **OR** ondansetron (ZOFRAN) IV   Vital Signs    Vitals:   11/18/20 2000 11/19/20 0025 11/19/20 0316 11/19/20 0749  BP:  (!) 154/65 (!) 147/72 (!) 131/59  Pulse: 78 74 80 67  Resp: 18 19 20 20   Temp:  97.8 F (36.6 C) 97.8 F (36.6 C) 98.1 F (36.7 C)  TempSrc:  Oral Oral Oral  SpO2: 94% 93% 93% 93%    Intake/Output Summary (Last 24 hours) at 11/19/2020 1008 Last data filed at 11/19/2020 0300 Gross per 24 hour  Intake -  Output 950 ml  Net -950 ml   Last 3 Weights 11/12/2020 08/10/2020 07/20/2020  Weight (lbs) 192 lb 193 lb 7 oz (No Data)  Weight (kg) 87.091 kg 87.743 kg (No Data)      Telemetry    SR - Personally Reviewed  ECG    No new tracing   Physical Exam   VITAL SIGNS:  reviewed GEN:  no acute distress PSYCH:  normal affect  Patient seems anxious on phone   Labs    Chemistry Recent Labs  Lab 11/17/20 0109 11/18/20 0040 11/18/20 1326 11/19/20 0135  NA 131* 128* 127* 127*  K 4.1 3.9 3.8 4.0  CL 103 100 99 99  CO2 17* 17* 17* 17*   GLUCOSE 203* 184* 255* 198*  BUN 59* 69* 74* 75*  CREATININE 3.34* 3.44* 3.41* 3.41*  CALCIUM 7.8* 7.6* 7.4* 7.8*  PROT 5.2* 4.7*  --  4.7*  ALBUMIN 2.4* 2.3*  --  2.3*  AST 84* 81*  --  60*  ALT 51* 59*  --  56*  ALKPHOS 46 46  --  46  BILITOT 0.5 0.6  --  0.9  GFRNONAA 14* 13* 13* 13*  ANIONGAP 11 11 11 11      Hematology Recent Labs  Lab 11/17/20 0109 11/18/20 0040 11/19/20 0135  WBC 13.0* 11.0* 9.5  RBC 3.04* 2.87* 2.97*  HGB 9.3* 8.6* 9.1*  HCT 26.6* 24.9* 25.9*  MCV 87.5 86.8 87.2  MCH 30.6 30.0 30.6  MCHC 35.0 34.5 35.1  RDW 12.4 12.4 12.3  PLT 190 225 249    DDimer  Recent Labs  Lab 11/17/20 0109 11/18/20 0040 11/19/20 0135  DDIMER 2.01* 2.09* 2.40*     Radiology    No results found.  Cardiac Studies   Echocardiogram 11/15/20 1. Left ventricular ejection fraction, by estimation, is 20 to 25%. The  left ventricle has severely decreased function. The left ventricle  demonstrates regional wall motion abnormalities (see scoring  diagram/findings for description). There is severe  concentric left ventricular hypertrophy. Left ventricular diastolic  parameters are indeterminate. Elevated left ventricular end-diastolic  pressure. There is akinesis of the left ventricular, basal-mid  inferoseptal wall and anteroseptal wall. There is  akinesis of the left ventricular, apical septal wall, inferolateral wall  and anterior wall. There is akinesis of the left ventricular, entire  inferior wall and apical segment.  2. Right ventricular systolic function is normal. The right ventricular  size is normal. There is moderately elevated pulmonary artery systolic  pressure. The estimated right ventricular systolic pressure is 57.8 mmHg.  3. The mitral valve is degenerative. Mild to moderate mitral valve  regurgitation. No evidence of mitral stenosis. Moderate mitral annular  calcification.  4. The aortic valve is normal in structure. Aortic valve regurgitation  is  not visualized. No aortic stenosis is present.  5. The inferior vena cava is dilated in size with <50% respiratory  variability, suggesting right atrial pressure of 15 mmHg.   Patient Profile     80 y.o. female with a hx of type 2 DM, CKD, hypertension, depression, and dyslipidemia admitted for COVID pneumonia. Seen by cardiology for new LBBB and found to have low EF.     Assessment & Plan    1. New LBBB 2. Elevated troponin  3. Acute systolic CHF.   Hs troponin peaked at 1569 on 1/11 and then did not dawn afterwards. EKG with new LBBB. Echo with LVEF of 20-25%, severe LVH, Elevated left ventricular end-diastolic  pressure. There is akinesis of the left ventricular, basal-mid  inferoseptal wall and anteroseptal wall. There is  akinesis of the left ventricular, apical septal wall, inferolateral wall  and anterior wall. There is akinesis of the left ventricular, entire  inferior wall and apical segment. Normal RV function. RVSP 54.43mm Hg. Mild to moderate MR.   Suspected elevated troponin likely demand ischemia due to COVID pneumonia and elevated creatinine. ? component of myopericarditis.  Thankfully, patient is chest pain free. She is not a cath candidate given CKD. No ACE/ARB/entresto due to renal function. Plan medical management currently.   Continue Coreg 6.25mg  BID, Imdur 30mg  qd and hydralazine 50mg  TID. Up titrate medications as BP allows. Can consider outpatient stress testing.    Will defer any diuretics therapy if needed per nephrology.   Please call cardmaster at time of discharge to arrange follow  Up.    For questions or updates, please contact [place] Please consult [email or web address] for contact info under        SignedLeanor Kail, PA  11/19/2020, 10:08 AM     I have  reviewed assessment and plan.  Agree with above as stated.  BP improved.  Will not increase hydralazine as BPs have improved. Renal dysfunction limits other therapies. No anginal sx.   Continue medical therapy.   Larae Grooms

## 2020-11-19 NOTE — Plan of Care (Signed)
  Problem: Education: Goal: Knowledge of risk factors and measures for prevention of condition will improve Outcome: Progressing   Problem: Coping: Goal: Psychosocial and spiritual needs will be supported Outcome: Progressing   Problem: Respiratory: Goal: Will maintain a patent airway Outcome: Progressing Goal: Complications related to the disease process, condition or treatment will be avoided or minimized Outcome: Progressing   

## 2020-11-19 NOTE — Progress Notes (Signed)
PROGRESS NOTE  Brenda Patterson XNT:[id number] DOB: [birth year] DOA: 11/14/2020 PCP: [name], MD  Brief History   80 year old woman PMH CKD, diabetes mellitus presented with nausea, vomiting, diarrhea for 1 week, symptomatic from Covid test + January 2 but no respiratory symptoms.  Admitted for multifocal pneumonia secondary to COVID-19 with associated nausea vomiting and diarrhea, dehydration, rhabdomyolysis, elevated troponin, new left bundle branch block and wall motion abnormalities concerning for myocarditis or ischemia.  Seen by cardiology not a candidate for cath secondary to renal insufficiency.  Renal function failed to improve with fluids, nephrology consulted.  AKI superimposed on CKD stage IIIa -- Thought secondary to nausea vomiting and diarrhea.  FENa suggests intrinsic likely 2/2 poor PO intake, nausea/vomiting/diarrhea from covid. -- Listed medications at home include chlorthalidone, hydrochlorothiazide -- Increase PO intake today - long talk about increased water intake -- Renal ultrasound nonacute -- Nephrology following - likely in the setting of covid infection superimposed on pre-renal volume depletion. -- Creatinine finally stabilizing - unclear if plateau  Elevated troponin with new left bundle branch block, new left ventricular systolic dysfunction -- Remains asymptomatic, no hypoxia.  Differential includes myopericarditis in the setting of COVID.  ACS unlikely. -- Continue management per cardiology.  Beta-blocker, consider hydralazine and nitrates.  -- Not a candidate for ACE inhibitor secondary to renal insufficiency.  COVID-19 multifocal pneumonia with associated nausea, vomiting diarrhea -- Remains in symptomatic despite chest x-ray findings, no hypoxia.  Inflammatory markers without significant change today.  . CXR on admit: multifocal pneumonia . Fever: none . Oxygen requirement: none . Antibiotics: none, procalcitonin .16  . Remdesivir: 1/9  > . Steroids: 1/9 >  . Actemra/baricitinib: not indicated . Vitamin C and Zinc . Proning: recommended Recent Labs    11/17/20 0109 11/18/20 0040 11/19/20 0135  DDIMER 2.01* 2.09* 2.40*  FERRITIN 365* 308* 293  CRP 2.9* 1.6* 1.1*   Elevated CK --no hx or trauma to suggest rhabdo, suspect elevated secondary to COVID/dehydration, however patient was on statin which has been discontinued. --CK repeat downtrending slowly  Hyponatremia, hypovolemic -- Low bicarb likely secondary to GI losses -- Modest, asymptomatic.  Diabetes mellitus type 2 -- CBG remains stable.  Glipizide on hold.  Continue sliding scale insulin.  Anemia of chronic disease --Stable Disposition Plan:  Discussion: as above  Dispo: The patient is from: Home              Anticipated d/c is to: Home              Anticipated d/c date is: 24-48h              Patient currently is not medically stable to d/c.  DVT prophylaxis: SCDs Start: 11/15/20 9675   Code Status: Full Code Family Communication: none  Holli Humbles DO  [facility] Direct contact: Epic/secure chat  7PM-7AM contact night coverage as at bottom of note 11/19/2020, 2:08 PM  LOS: 4 days     Consults:  . Nephrology  . Cardiology  Interval History/Subjective  No acute issues or events overnight, continues to fixate on discharge home, husband at bedside. Patient distraught about possibly spending another night in the hospital - some concern for hospital delirium; however, she continues to answer orientation questions appropriately.   Objective   Vitals:  Vitals:   11/19/20 0749 11/19/20 1113  BP: (!) 131/59 (!) 132/94  Pulse: 67 72  Resp: 20 17  Temp: 98.1 F (36.7 C) 97.6 F (36.4 C)  SpO2: 93%  95%    Exam: Constitutional:   . Anxious, agitated, requesting discharge home in tears. ENMT:  . grossly normal hearing  Respiratory:  . CTA bilaterally, no w/r/r.  . Respiratory effort normal.  Cardiovascular:  . RRR, no  m/r/g . No LE extremity edema   Psychiatric:  . Mental status o Mood, affect appropriate  Scheduled Meds: . aspirin EC  81 mg Oral Daily  . carvedilol  6.25 mg Oral BID  . hydrALAZINE  50 mg Oral TID  . insulin aspart  0-9 Units Subcutaneous Q4H  . isosorbide mononitrate  30 mg Oral Daily  . levothyroxine  25 mcg Oral QAC breakfast  . predniSONE  50 mg Oral Daily  . sertraline  50 mg Oral Daily  . sodium chloride flush  3 mL Intravenous Q12H    Principal Problem:   Pneumonia due to COVID-19 virus Active Problems:   Type 2 diabetes mellitus, controlled (HCC)   Hyperlipidemia   Essential hypertension   Obesity (BMI 30-39.9)   Nausea vomiting and diarrhea   Anemia of chronic disease   CKD (chronic kidney disease) stage 3, GFR 30-59 ml/min (HCC)   Elevated troponin   Abnormal ECG   AKI (acute kidney injury) (HCC)   LBBB (left bundle branch block)   LOS: 4 days

## 2020-11-20 DIAGNOSIS — J1282 Pneumonia due to coronavirus disease 2019: Secondary | ICD-10-CM | POA: Diagnosis not present

## 2020-11-20 DIAGNOSIS — U071 COVID-19: Secondary | ICD-10-CM | POA: Diagnosis not present

## 2020-11-20 LAB — CBC WITH DIFFERENTIAL/PLATELET
Abs Immature Granulocytes: 0.24 10*3/uL — ABNORMAL HIGH (ref 0.00–0.07)
Basophils Absolute: 0 10*3/uL (ref 0.0–0.1)
Basophils Relative: 0 %
Eosinophils Absolute: 0 10*3/uL (ref 0.0–0.5)
Eosinophils Relative: 0 %
HCT: 26.3 % — ABNORMAL LOW (ref 36.0–46.0)
Hemoglobin: 9.1 g/dL — ABNORMAL LOW (ref 12.0–15.0)
Immature Granulocytes: 2 %
Lymphocytes Relative: 11 %
Lymphs Abs: 1.1 10*3/uL (ref 0.7–4.0)
MCH: 30 pg (ref 26.0–34.0)
MCHC: 34.6 g/dL (ref 30.0–36.0)
MCV: 86.8 fL (ref 80.0–100.0)
Monocytes Absolute: 0.6 10*3/uL (ref 0.1–1.0)
Monocytes Relative: 6 %
Neutro Abs: 7.9 10*3/uL — ABNORMAL HIGH (ref 1.7–7.7)
Neutrophils Relative %: 81 %
Platelets: 359 10*3/uL (ref 150–400)
RBC: 3.03 MIL/uL — ABNORMAL LOW (ref 3.87–5.11)
RDW: 12.1 % (ref 11.5–15.5)
WBC: 9.9 10*3/uL (ref 4.0–10.5)
nRBC: 0 % (ref 0.0–0.2)

## 2020-11-20 LAB — CK: Total CK: 408 U/L — ABNORMAL HIGH (ref 38–234)

## 2020-11-20 LAB — D-DIMER, QUANTITATIVE: D-Dimer, Quant: 2.61 ug/mL-FEU — ABNORMAL HIGH (ref 0.00–0.50)

## 2020-11-20 LAB — COMPREHENSIVE METABOLIC PANEL
ALT: 51 U/L — ABNORMAL HIGH (ref 0–44)
AST: 45 U/L — ABNORMAL HIGH (ref 15–41)
Albumin: 2.4 g/dL — ABNORMAL LOW (ref 3.5–5.0)
Alkaline Phosphatase: 50 U/L (ref 38–126)
Anion gap: 10 (ref 5–15)
BUN: 74 mg/dL — ABNORMAL HIGH (ref 8–23)
CO2: 17 mmol/L — ABNORMAL LOW (ref 22–32)
Calcium: 7.8 mg/dL — ABNORMAL LOW (ref 8.9–10.3)
Chloride: 98 mmol/L (ref 98–111)
Creatinine, Ser: 3.33 mg/dL — ABNORMAL HIGH (ref 0.44–1.00)
GFR, Estimated: 14 mL/min — ABNORMAL LOW (ref >60)
Glucose, Bld: 212 mg/dL — ABNORMAL HIGH (ref 70–99)
Potassium: 4 mmol/L (ref 3.5–5.1)
Sodium: 125 mmol/L — ABNORMAL LOW (ref 135–145)
Total Bilirubin: 0.7 mg/dL (ref 0.3–1.2)
Total Protein: 4.7 g/dL — ABNORMAL LOW (ref 6.5–8.1)

## 2020-11-20 LAB — GLUCOSE, CAPILLARY
Glucose-Capillary: 159 mg/dL — ABNORMAL HIGH (ref 70–99)
Glucose-Capillary: 112 mg/dL — ABNORMAL HIGH (ref 70–99)
Glucose-Capillary: 198 mg/dL — ABNORMAL HIGH (ref 70–99)

## 2020-11-20 LAB — PHOSPHORUS: Phosphorus: 4.5 mg/dL (ref 2.5–4.6)

## 2020-11-20 LAB — FERRITIN: Ferritin: 291 ng/mL (ref 11–307)

## 2020-11-20 LAB — C-REACTIVE PROTEIN: CRP: 0.9 mg/dL (ref <1.0)

## 2020-11-20 LAB — MAGNESIUM: Magnesium: 1.8 mg/dL (ref 1.7–2.4)

## 2020-11-20 MED ORDER — ISOSORBIDE MONONITRATE ER 30 MG PO TB24: 30.0000 mg | ORAL_TABLET | Freq: Every day | ORAL | 1 refills | Status: AC

## 2020-11-20 MED ORDER — SODIUM BICARBONATE 650 MG PO TABS: 1300.0000 mg | ORAL_TABLET | Freq: Three times a day (TID) | ORAL | Status: DC

## 2020-11-20 MED ORDER — LACTATED RINGERS IV SOLN: INTRAVENOUS | Status: DC

## 2020-11-20 MED ORDER — PREDNISONE 10 MG PO TABS: ORAL_TABLET | ORAL | 0 refills | Status: AC

## 2020-11-20 NOTE — TOC Transition Note (Signed)
Transition of Care [facility]) - CM/SW Discharge Note   Patient Details  Name: Brenda Patterson MRN: [id number] Date of Birth: [birth year]  [facility] CM/SW Contact:  Zenon Mayo, RN Phone Number: 11/20/2020, 8:51 AM   Clinical Narrative:    NCM spoke with patient, she lives with spouse, [facility],  She has no issues with getting her medications, she states her spouse will transport her home at discharge, she has a rolling walker and a w/chair at home.  She has a PCP, Dr. Elease Hashimoto and she will make a follow up apt with him on Monday when office opens.      Final next level of care: Home/Self Care Barriers to Discharge: Continued Medical Work up   Patient Goals and CMS Choice Patient states their goals for this hospitalization and ongoing recovery are:: get better   Choice offered to / list presented to : NA  Discharge Placement                       Discharge Plan and Services In-house Referral: NA Discharge Planning Services: CM Consult Post Acute Care Choice: NA            DME Agency: NA       HH Arranged: NA          Social Determinants of Health (SDOH) Interventions     Readmission Risk Interventions Readmission Risk Prevention Plan 11/20/2020  Transportation Screening Complete  PCP or Specialist Appt within 5-7 Days Complete  Home Care Screening Complete  Medication Review (RN CM) Complete  Some recent data might be hidden

## 2020-11-20 NOTE — TOC Initial Note (Signed)
Transition of Care [facility]) - Initial/Assessment Note    Patient Details  Name: Brenda Patterson MRN: [id number] Date of Birth: [birth year]  Transition of Care [facility]) CM/SW Contact:    Zenon Mayo, RN Phone Number: 11/20/2020, 8:48 AM  Clinical Narrative:                 NCM spoke with patient, she lives with spouse, [facility],  She has no issues with getting her medications, she states her spouse will transport her home at discharge, she has a rolling walker and a w/chair at home.  She has a PCP, Dr. Elease Hashimoto and she will make a follow up apt with him on Monday when office opens.    Expected Discharge Plan: Home/Self Care Barriers to Discharge: Continued Medical Work up   Patient Goals and CMS Choice Patient states their goals for this hospitalization and ongoing recovery are:: get better   Choice offered to / list presented to : NA  Expected Discharge Plan and Services Expected Discharge Plan: Home/Self Care In-house Referral: NA Discharge Planning Services: CM Consult Post Acute Care Choice: NA Living arrangements for the past 2 months: Single Family Home Expected Discharge Date: 11/20/20                 DME Agency: NA       HH Arranged: NA          Prior Living Arrangements/Services Living arrangements for the past 2 months: Single Family Home Lives with:: Spouse Patient language and need for interpreter reviewed:: Yes Do you feel safe going back to the place where you live?: Yes      Need for Family Participation in Patient Care: Yes (Comment) Care giver support system in place?: Yes (comment) Current home services: DME (she has w/chair, and a rolling walker) Criminal Activity/Legal Involvement Pertinent to Current Situation/Hospitalization: No - Comment as needed  Activities of Daily Living      Permission Sought/Granted                  Emotional Assessment   Attitude/Demeanor/Rapport:  Engaged Affect (typically observed): Appropriate Orientation: : Oriented to Self,Oriented to Place,Oriented to  Time,Oriented to Situation Alcohol / Substance Use: Not Applicable Psych Involvement: No (comment)  Admission diagnosis:  Elevated troponin [R77.8] AKI (acute kidney injury) ([place]) [N17.9] Nausea vomiting and diarrhea [R11.2, R19.7] Fatigue, unspecified type [R53.83] Pneumonia due to COVID-19 virus [U07.1, J12.82] COVID-19 [U07.1] Patient Active Problem List   Diagnosis Date Noted  . Elevated troponin 11/15/2020  . Abnormal ECG 11/15/2020  . AKI (acute kidney injury) ([place]) 11/15/2020  . LBBB (left bundle branch block) 11/15/2020  . Pneumonia due to COVID-19 virus 11/14/2020  . Elevated TSH 02/21/2019  . Osteopenia 10/22/2017  . Acute frontal sinusitis 11/11/2015  . CKD (chronic kidney disease) stage 3, GFR 30-59 ml/min (HCC) 10/26/2015  . Anemia of chronic disease   . PNA (pneumonia) 12/26/2014  . Nausea vomiting and diarrhea 01/13/2014  . Obesity (BMI 30-39.9) 09/25/2013  . INGROWN TOENAIL 06/16/2010  . Hyperlipidemia 10/27/2009  . History of depression 10/27/2009  . ANEMIA, HX OF 03/25/2009  . Type 2 diabetes mellitus, controlled ([place]) 02/25/2009  . Essential hypertension 02/25/2009  . Allergic rhinitis 02/25/2009   PCP:  [name], MD Pharmacy:   [facility] [address], [place] [address] [place]  [address] Phone: [phone] Fax: [phone]  [facility] [place],  [address], [place], [place] [address] Phone: [phone] Fax: [phone]     Social Determinants of Health (SDOH) Interventions    Readmission Risk Interventions Readmission Risk Prevention Plan 11/20/2020  Transportation Screening Complete  PCP or Specialist Appt within 5-7 Days Complete  Home Care Screening Complete  Medication Review (RN CM) Complete  Some recent data might  be hidden

## 2020-11-20 NOTE — Progress Notes (Signed)
[facility] Progress Note    Assessment/ Plan:   80 year old WF-   DM, HTN and CKD-  Now with covid and new dec EF-  A on CRF as a result  1. Non-oliguric AKI on CKD4, stable. 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 Follows with me at our office (have only seen her once in the past). Baseline Cr around 2.3-2.6. -starting fluids, looks very dry on exam. LR 100cc/hr for 5 hours -AKI likely related to [place] in itself and vol depletion -Seems like she's in the plateau phase in regards to her AKI, time will tell where her kidney function settles out to -Agree with holding ACEI and diuretics for now. Would recommend spot diuresis moving forward -renal u/s without obstruction -ck better -No indications for renal replacement therapy at this time -Avoid nephrotoxic medications including NSAIDs and iodinated intravenous contrast exposure unless the latter is absolutely indicated.  Preferred narcotic agents for pain control are hydromorphone, fentanyl, and methadone. Morphine should not be used. Avoid Baclofen and avoid oral sodium phosphate and magnesium citrate based laxatives / bowel preps. Continue strict Input and Output monitoring. Will monitor the patient closely with you and intervene or adjust therapy as indicated by changes in clinical status/labs  2. Hypertension/volume  -OP diuretics on hold 3.  New dec EF-  Per cardiology - medical management for now 4. Anemia  - stable this am. Transfuse prn for hgb <7 5. Hyponatremia, worsened -likely related to AKI and free water retention -fluid restriction, 1.5L/day ordered -sosm, uosm, ulytes ordered -monitor daily -encourage solute intake -na down to 125 from 127 today 6. Metabolic acidosis -stable, starting nahco3 1300mg  tid  Subjective:   Seen and examined early this am, 0.8L uop. No acute events. Open for d/c today per staff.   Objective:   BP (!) 148/68   Pulse 78   Temp 97.6 F (36.4 C) (Oral)   Resp 19   SpO2 93%    Intake/Output Summary (Last 24 hours) at 11/20/2020 1156 Last data filed at 11/20/2020 0400 Gross per 24 hour  Intake 240 ml  Output 800 ml  Net -560 ml   Weight change:   Physical Exam: Gen: ill appearing HEENT: dry mucosal membranes CV: reg rate Resp: unlabored Skin: poor skin turgor Abd: soft Neuro: following some commands, speech slurred  Imaging: No results found.  Labs: BMET Recent Labs  Lab 11/14/20 1451 11/16/20 0500 11/17/20 0109 11/18/20 0040 11/18/20 1326 11/19/20 0135 11/20/20 0037  NA 127* 131* 131* 128* 127* 127* 125*  K 3.5 3.8 4.1 3.9 3.8 4.0 4.0  CL 98 101 103 100 99 99 98  CO2 18* 17* 17* 17* 17* 17* 17*  GLUCOSE 178* 238* 203* 184* 255* 198* 212*  BUN 35* 50* 59* 69* 74* 75* 74*  CREATININE 3.02* 3.32* 3.34* 3.44* 3.41* 3.41* 3.33*  CALCIUM 7.8* 8.1* 7.8* 7.6* 7.4* 7.8* 7.8*  PHOS  --  4.3 4.1 4.4  --  4.2 4.5   CBC Recent Labs  Lab 11/17/20 0109 11/18/20 0040 11/19/20 0135 11/20/20 0037  WBC 13.0* 11.0* 9.5 9.9  NEUTROABS 12.7* 9.3* 7.4 7.9*  HGB 9.3* 8.6* 9.1* 9.1*  HCT 26.6* 24.9* 25.9* 26.3*  MCV 87.5 86.8 87.2 86.8  PLT 190 225 249 359    Medications:    . aspirin EC  81 mg Oral Daily  . carvedilol  6.25 mg Oral BID  . hydrALAZINE  50 mg Oral TID  . insulin aspart  0-9  Units Subcutaneous Q4H  . isosorbide mononitrate  30 mg Oral Daily  . levothyroxine  25 mcg Oral QAC breakfast  . predniSONE  50 mg Oral Daily  . sertraline  50 mg Oral Daily  . sodium chloride flush  3 mL Intravenous Q12H      Gean Quint, MD [facility] 11/20/2020, 11:56 AM

## 2020-11-20 NOTE — Discharge Summary (Signed)
Physician Discharge Summary  Karlin Binion DGU:[id number] DOB: 05/07/1941 DOA: 11/14/2020  PCP: [name], MD  Admit date: 11/14/2020 Discharge date: 11/20/2020  Admitted From: Home Disposition: Home  Recommendations for Outpatient Follow-up:  1. Follow up with PCP in 1-2 weeks 2. Please obtain BMP/CBC in one week 3. Please follow up with nephrology in the outpatient setting as scheduled  Home Health: None Equipment/Devices: None  Discharge Condition: Stable CODE STATUS: Full Diet recommendation: Renal diet as tolerated  Brief/Interim Summary: 80 year old woman PMH CKD, diabetes mellitus presented with nausea, vomiting, diarrhea for 1 week, symptomatic from Covid test + January 2 but no respiratory symptoms.  Admitted for multifocal pneumonia secondary to COVID-19 with associated nausea vomiting and diarrhea, dehydration, rhabdomyolysis, elevated troponin, new left bundle branch block and wall motion abnormalities concerning for myocarditis or ischemia.  Seen by cardiology not a candidate for cath secondary to renal insufficiency.  Renal function failed to improve with fluids, nephrology consulted.  Patient admitted as above with acute COVID-19 multifocal pneumonia with intractable nausea vomiting diarrhea.  During hospitalization patient had elevated troponin in the setting of hypoxia, questionable myopericarditis in the setting of COVID infection, cardiology following, currently holding ACE inhibitor due to renal insufficiency no other inpatient evaluation in the setting of renal insufficiency as well.  Likely follow-up outpatient with cardiology.  Patient had notable AKI on her known CKD, likely related to patient's poor p.o. intake and prerenal azotemia, resolving now with increased p.o. intake otherwise stable for discharge.  Discussed at length need for ongoing quarantine per protocol, has completed Remdesivir, finish prednisone taper outpatient setting.  Follow-up with PCP  and nephrology as scheduled for repeat labs.  Discharge Diagnoses:  Principal Problem:   Pneumonia due to COVID-19 virus Active Problems:   Type 2 diabetes mellitus, controlled ([place])   Hyperlipidemia   Essential hypertension   Obesity (BMI 30-39.9)   Nausea vomiting and diarrhea   Anemia of chronic disease   CKD (chronic kidney disease) stage 3, GFR 30-59 ml/min (HCC)   Elevated troponin   Abnormal ECG   AKI (acute kidney injury) (HCC)   LBBB (left bundle branch block)    Discharge Instructions  Discharge Instructions    Call MD for:  difficulty breathing, headache or visual disturbances   Complete by: As directed    Diet - low sodium heart healthy   Complete by: As directed    Increase activity slowly   Complete by: As directed      Allergies as of 11/20/2020      Reactions   Azithromycin Nausea Only   Clarithromycin Other (See Comments)   Heparin    Patient states it makes her weak   Penicillins Rash   Rash noted inside her mouth and lips      Medication List    STOP taking these medications   chlorthalidone 25 MG tablet Commonly known as: HYGROTON   hydrochlorothiazide 25 MG tablet Commonly known as: HYDRODIURIL   lisinopril 20 MG tablet Commonly known as: ZESTRIL   NIFEdipine 30 MG 24 hr tablet Commonly known as: PROCARDIA-XL/NIFEDICAL-XL     TAKE these medications   albuterol 108 (90 Base) MCG/ACT inhaler Commonly known as: VENTOLIN HFA Inhale 2 puffs into the lungs every 6 (six) hours as needed for wheezing or shortness of breath.   aspirin EC 81 MG tablet Take 81 mg by mouth daily.   CALCIUM 600 PO Take 600 mg by mouth 2 (two) times daily.   carvedilol 6.25 MG  tablet Commonly known as: COREG TAKE 1 TABLET BY MOUTH  TWICE DAILY What changed: when to take this   fluticasone 50 MCG/ACT nasal spray Commonly known as: FLONASE Place 2 sprays into both nostrils daily. What changed:   when to take this  reasons to take this   glipiZIDE  2.5 MG 24 hr tablet Commonly known as: GLUCOTROL XL TAKE 1 TABLET BY MOUTH  DAILY WITH BREAKFAST   isosorbide mononitrate 30 MG 24 hr tablet Commonly known as: IMDUR Take 1 tablet (30 mg total) by mouth daily.   levothyroxine 25 MCG tablet Commonly known as: SYNTHROID TAKE 1 TABLET BY MOUTH ONCE DAILY WITH BREAKFAST What changed: See the new instructions.   loperamide 2 MG tablet Commonly known as: Imodium A-D Take 1 tablet (2 mg total) by mouth 4 (four) times daily as needed for diarrhea or loose stools.   multivitamin tablet Take 1 tablet by mouth daily.   ondansetron 4 MG tablet Commonly known as: Zofran Take 1 tablet (4 mg total) by mouth every 8 (eight) hours as needed for nausea or vomiting.   predniSONE 10 MG tablet Commonly known as: DELTASONE Take 4 tablets (40 mg total) by mouth daily for 3 days, THEN 3 tablets (30 mg total) daily for 3 days, THEN 2 tablets (20 mg total) daily for 3 days, THEN 1 tablet (10 mg total) daily for 3 days. Start taking on: November 20, 2020   pyridOXINE 100 MG tablet Commonly known as: VITAMIN B-6 Take 100 mg by mouth daily.   rosuvastatin 20 MG tablet Commonly known as: Crestor Take 1 tablet (20 mg total) by mouth daily.   sertraline 50 MG tablet Commonly known as: ZOLOFT TAKE 1 TABLET BY MOUTH  DAILY   vitamin C 1000 MG tablet Take 1,000 mg by mouth daily.   Vitamin D 50 MCG (2000 UT) tablet Take 2,000 Units by mouth daily.   Zinc 220 (50 Zn) MG Caps Take 220 mg by mouth daily.       Allergies  Allergen Reactions  . Azithromycin Nausea Only  . Clarithromycin Other (See Comments)  . Heparin     Patient states it makes her weak  . Penicillins Rash    Rash noted inside her mouth and lips    Consultations: Cardiology, nephrology  Procedures/Studies: US RENAL  Result Date: 11/16/2020 CLINICAL DATA:  Acute kidney injury EXAM: RENAL / URINARY TRACT ULTRASOUND COMPLETE COMPARISON:  08/23/2020 FINDINGS: Right Kidney:  Renal measurements: 9.0 x 5.1 x 4.7 cm = volume: 113 mL. Increased renal parenchymal echogenicity. No shadowing stone or hydronephrosis visualized. Again seen are multiple simple appearing right renal cysts including a 1.4 cm upper pole cyst, 1.6 cm cyst within the interpolar region and a 1.9 cm lower pole cyst. Left Kidney: Renal measurements: 8.4 x 5.4 x 5.1 cm = volume: 119 mL. Increased renal parenchymal echogenicity. No shadowing stone or hydronephrosis visualized. 1.8 cm upper pole left renal cyst. Bladder: Appears normal for degree of bladder distention. Other: None. IMPRESSION: 1. Negative for obstructive uropathy. 2. Findings of chronic medical renal disease. Electronically Signed   By: Davina Poke D.O.   On: 11/16/2020 10:25   DG Chest Portable 1 View  Result Date: 11/14/2020 CLINICAL DATA:  80 year old female with positive COVID-19. EXAM: PORTABLE CHEST 1 VIEW COMPARISON:  Chest radiograph date 06/05/2018 FINDINGS: Bilateral confluent and streaky densities consistent with multifocal pneumonia and in keeping with COVID-19. Clinical correlation is recommended there is no pleural effusion pneumothorax. There is  mild cardiomegaly. Atherosclerotic calcification of the aorta. No acute osseous pathology. IMPRESSION: Multifocal pneumonia. Electronically Signed   By: Anner Crete M.D.   On: 11/14/2020 19:39   ECHOCARDIOGRAM COMPLETE  Result Date: 11/15/2020    ECHOCARDIOGRAM REPORT   Patient Name:   LEXEY FLETES Date of Exam: 11/15/2020 Medical Rec #:  [id number]             Height:       62.0 in Accession #:    [id number]            Weight:       192.0 lb Date of Birth:  Oct 01, 1941              BSA:          1.879 m Patient Age:    64 years              BP:           122/42 mmHg Patient Gender: F                     HR:           72 bpm. Exam Location:  Inpatient Procedure: 2D Echo, Cardiac Doppler, Color Doppler and Intracardiac            Opacification Agent Indications:    R07.9* Chest  pain, unspecified  History:        Patient has prior history of Echocardiogram examinations, most                 recent 12/02/2012. Abnormal ECG, Arrythmias:RBBB; Risk                 Factors:Diabetes, Dyslipidemia and Hypertension. Covid positive.                 Elevated troponin. Pneumonia.  Sonographer:    Roseanna Rainbow RDCS Referring Phys: [place]  Sonographer Comments: Technically difficult study due to poor echo windows and patient is morbidly obese. Image acquisition challenging due to patient body habitus and Image acquisition challenging due to uncooperative patient. Had to pause exam due to patient throwing off clothes and nearly grabbing me, and rolling out of bed. Patient calmed down, and I continued the study. IMPRESSIONS  1. Left ventricular ejection fraction, by estimation, is 20 to 25%. The left ventricle has severely decreased function. The left ventricle demonstrates regional wall motion abnormalities (see scoring diagram/findings for description). There is severe concentric left ventricular hypertrophy. Left ventricular diastolic parameters are indeterminate. Elevated left ventricular end-diastolic pressure. There is akinesis of the left ventricular, basal-mid inferoseptal wall and anteroseptal wall. There is akinesis of the left ventricular, apical septal wall, inferolateral wall and anterior wall. There is akinesis of the left ventricular, entire inferior wall and apical segment.  2. Right ventricular systolic function is normal. The right ventricular size is normal. There is moderately elevated pulmonary artery systolic pressure. The estimated right ventricular systolic pressure is 63.7 mmHg.  3. The mitral valve is degenerative. Mild to moderate mitral valve regurgitation. No evidence of mitral stenosis. Moderate mitral annular calcification.  4. The aortic valve is normal in structure. Aortic valve regurgitation is not visualized. No aortic stenosis is present.  5. The inferior  vena cava is dilated in size with <50% respiratory variability, suggesting right atrial pressure of 15 mmHg. FINDINGS  Left Ventricle: Left ventricular ejection fraction, by estimation, is 20 to 25%. The left ventricle has severely decreased function.  The left ventricle demonstrates regional wall motion abnormalities. Definity contrast agent was given IV to delineate the left ventricular endocardial borders. The left ventricular internal cavity size was normal in size. There is severe concentric left ventricular hypertrophy. Abnormal (paradoxical) septal motion, consistent with left bundle branch block. Left ventricular diastolic parameters are indeterminate. Elevated left ventricular end-diastolic pressure. Right Ventricle: The right ventricular size is normal. No increase in right ventricular wall thickness. Right ventricular systolic function is normal. There is moderately elevated pulmonary artery systolic pressure. The tricuspid regurgitant velocity is 3.14 m/s, and with an assumed right atrial pressure of 15 mmHg, the estimated right ventricular systolic pressure is 76.1 mmHg. Left Atrium: Left atrial size was normal in size. Right Atrium: Right atrial size was normal in size. Pericardium: There is no evidence of pericardial effusion. Mitral Valve: The mitral valve is degenerative in appearance. There is mild thickening of the mitral valve leaflet(s). Moderate mitral annular calcification. Mild to moderate mitral valve regurgitation. No evidence of mitral valve stenosis. MV peak gradient, 13.0 mmHg. The mean mitral valve gradient is 3.0 mmHg. Tricuspid Valve: The tricuspid valve is normal in structure. Tricuspid valve regurgitation is mild . No evidence of tricuspid stenosis. Aortic Valve: The aortic valve is normal in structure. Aortic valve regurgitation is not visualized. No aortic stenosis is present. Pulmonic Valve: The pulmonic valve was normal in structure. Pulmonic valve regurgitation is not  visualized. No evidence of pulmonic stenosis. Aorta: The aortic root is normal in size and structure. Venous: The inferior vena cava is dilated in size with less than 50% respiratory variability, suggesting right atrial pressure of 15 mmHg. IAS/Shunts: The interatrial septum appears to be lipomatous. No atrial level shunt detected by color flow Doppler.  LEFT VENTRICLE PLAX 2D LVIDd:         4.90 cm      Diastology LVIDs:         4.70 cm      LV e' medial:    3.05 cm/s LV PW:         2.10 cm      LV E/e' medial:  47.2 LV IVS:        1.70 cm      LV e' lateral:   4.90 cm/s LVOT diam:     2.20 cm      LV E/e' lateral: 29.4 LV SV:         74 LV SV Index:   39 LVOT Area:     3.80 cm  LV Volumes (MOD) LV vol d, MOD A2C: 121.0 ml LV vol d, MOD A4C: 133.0 ml LV vol s, MOD A2C: 85.5 ml LV vol s, MOD A4C: 120.0 ml LV SV MOD A2C:     35.5 ml LV SV MOD A4C:     133.0 ml LV SV MOD BP:      25.8 ml RIGHT VENTRICLE             IVC RV S prime:     15.00 cm/s  IVC diam: 2.10 cm TAPSE (M-mode): 1.9 cm LEFT ATRIUM             Index       RIGHT ATRIUM           Index LA diam:        4.00 cm 2.13 cm/m  RA Area:     10.40 cm LA Vol (A2C):   49.5 ml 26.35 ml/m RA Volume:   22.40 ml  11.92 ml/m  LA Vol (A4C):   51.0 ml 27.14 ml/m LA Biplane Vol: 52.3 ml 27.84 ml/m  AORTIC VALVE LVOT Vmax:   98.90 cm/s LVOT Vmean:  61.100 cm/s LVOT VTI:    0.195 m  AORTA Ao Root diam: 3.30 cm Ao Asc diam:  3.00 cm MITRAL VALVE                 TRICUSPID VALVE MV Area (PHT): 5.38 cm      TR Peak grad:   39.4 mmHg MV Peak grad:  13.0 mmHg     TR Vmax:        314.00 cm/s MV Mean grad:  3.0 mmHg MV Vmax:       1.80 m/s      SHUNTS MV Vmean:      66.7 cm/s     Systemic VTI:  0.20 m MV Decel Time: 141 msec      Systemic Diam: 2.20 cm MR Peak grad:    128.1 mmHg MR Mean grad:    99.0 mmHg MR Vmax:         566.00 cm/s MR Vmean:        479.0 cm/s MR PISA:         1.57 cm MR PISA Eff ROA: 11 mm MR PISA Radius:  0.50 cm MV E velocity: 144.00 cm/s Fransico Him  MD Electronically signed by Fransico Him MD Signature Date/Time: 11/15/2020/1:21:39 PM    Final      Subjective: No acute issues or events overnight   Discharge Exam: Vitals:   11/20/20 0540 11/20/20 0830  BP: (!) 150/76 (!) 148/68  Pulse: 73 78  Resp: 16 19  Temp:  97.6 F (36.4 C)  SpO2: 93% 93%   Vitals:   11/19/20 2353 11/20/20 0449 11/20/20 0540 11/20/20 0830  BP: (!) 156/80 (!) 174/89 (!) 150/76 (!) 148/68  Pulse: 79 74 73 78  Resp: 20 18 16 19   Temp: 97.6 F (36.4 C) 98.1 F (36.7 C)  97.6 F (36.4 C)  TempSrc: Oral Oral  Oral  SpO2: 96% 93% 93% 93%    General: Pt is alert, awake, not in acute distress Cardiovascular: RRR, S1/S2 +, no rubs, no gallops Respiratory: CTA bilaterally, no wheezing, no rhonchi Abdominal: Soft, NT, ND, bowel sounds + Extremities: no edema, no cyanosis    The results of significant diagnostics from this hospitalization (including imaging, microbiology, ancillary and laboratory) are listed below for reference.     Microbiology: Recent Results (from the past 240 hour(s))  Resp Panel by RT-PCR (Flu A&B, Covid) Nasopharyngeal Swab     Status: Abnormal   Collection Time: 11/15/20  4:10 AM   Specimen: Nasopharyngeal Swab; Nasopharyngeal(NP) swabs in vial transport medium  Result Value Ref Range Status   SARS Coronavirus 2 by RT PCR POSITIVE (A) NEGATIVE Final    Comment: RESULT CALLED TO, READ BACK BY AND VERIFIED WITH: RN CHRIS CHRISCO AT 0602 BY MESSAN H. ON N1/08/2021 (NOTE) SARS-CoV-2 target nucleic acids are DETECTED.  The SARS-CoV-2 RNA is generally detectable in upper respiratory specimens during the acute phase of infection. Positive results are indicative of the presence of the identified virus, but do not rule out bacterial infection or co-infection with other pathogens not detected by the test. Clinical correlation with patient history and other diagnostic information is necessary to determine patient infection status.  The expected result is Negative.  Fact Sheet for Patients: EntrepreneurPulse.com.au  Fact Sheet for Healthcare Providers: IncredibleEmployment.be  This test is not yet  approved or cleared by the [place] and  has been authorized for detection and/or diagnosis of SARS-CoV-2 by FDA under an Emergency Use Authorization (EUA).  This EUA will remain in effect (meanin g this test can be used) for the duration of  the COVID-19 declaration under Section 564(b)(1) of the Act, 21 U.S.C. section 360bbb-3(b)(1), unless the authorization is terminated or revoked sooner.     Influenza A by PCR NEGATIVE NEGATIVE Final   Influenza B by PCR NEGATIVE NEGATIVE Final    Comment: (NOTE) The Xpert Xpress SARS-CoV-2/FLU/RSV plus assay is intended as an aid in the diagnosis of influenza from Nasopharyngeal swab specimens and should not be used as a sole basis for treatment. Nasal washings and aspirates are unacceptable for Xpert Xpress SARS-CoV-2/FLU/RSV testing.  Fact Sheet for Patients: EntrepreneurPulse.com.au  Fact Sheet for Healthcare Providers: IncredibleEmployment.be  This test is not yet approved or cleared by the [place] FDA and has been authorized for detection and/or diagnosis of SARS-CoV-2 by FDA under an Emergency Use Authorization (EUA). This EUA will remain in effect (meaning this test can be used) for the duration of the COVID-19 declaration under Section 564(b)(1) of the Act, 21 U.S.C. section 360bbb-3(b)(1), unless the authorization is terminated or revoked.  Performed at [facility], [place] [address] [place], [place] [address]   Urine culture     Status: Abnormal   Collection Time: 11/16/20  5:34 PM   Specimen: Urine, Random  Result Value Ref Range Status   Specimen Description URINE, RANDOM  Final   Special Requests   Final    NONE Performed at [facility], [place] [address] [place], [place] [address]    Culture MULTIPLE SPECIES PRESENT, SUGGEST RECOLLECTION (A)  Final   Report Status 11/18/2020 FINAL  Final     Labs: BNP (last 3 results) No results for input(s): BNP in the last 8760 hours. Basic Metabolic Panel: Recent Labs  Lab 11/16/20 0500 11/17/20 0109 11/18/20 0040 11/18/20 1326 11/19/20 0135 11/20/20 0037  NA 131* 131* 128* 127* 127* 125*  K 3.8 4.1 3.9 3.8 4.0 4.0  CL 101 103 100 99 99 98  CO2 17* 17* 17* 17* 17* 17*  GLUCOSE 238* 203* 184* 255* 198* 212*  BUN 50* 59* 69* 74* 75* 74*  CREATININE 3.32* 3.34* 3.44* 3.41* 3.41* 3.33*  CALCIUM 8.1* 7.8* 7.6* 7.4* 7.8* 7.8*  MG 1.7 1.7 1.7  --  1.8 1.8  PHOS 4.3 4.1 4.4  --  4.2 4.5   Liver Function Tests: Recent Labs  Lab 11/16/20 0500 11/17/20 0109 11/18/20 0040 11/19/20 0135 11/20/20 0037  AST 88* 84* 81* 60* 45*  ALT 48* 51* 59* 56* 51*  ALKPHOS 46 46 46 46 50  BILITOT 0.3 0.5 0.6 0.9 0.7  PROT 5.5* 5.2* 4.7* 4.7* 4.7*  ALBUMIN 2.6* 2.4* 2.3* 2.3* 2.4*   Recent Labs  Lab 11/14/20 1922  LIPASE 35   No results for input(s): AMMONIA in the last 168 hours. CBC: Recent Labs  Lab 11/16/20 0500 11/17/20 0109 11/18/20 0040 11/19/20 0135 11/20/20 0037  WBC 13.8* 13.0* 11.0* 9.5 9.9  NEUTROABS 11.9* 12.7* 9.3* 7.4 7.9*  HGB 10.4* 9.3* 8.6* 9.1* 9.1*  HCT 30.2* 26.6* 24.9* 25.9* 26.3*  MCV 88.3 87.5 86.8 87.2 86.8  PLT 178 190 225 249 359   Cardiac Enzymes: Recent Labs  Lab 11/15/20 0249 11/16/20 0500 11/17/20 0109 11/18/20 0040 11/20/20 0037  CKTOTAL 1,191* 1,229* 1,159* 1,157* 408*  BNP: Invalid input(s): POCBNP CBG: Recent Labs  Lab 11/19/20 2001 11/19/20 2343 11/20/20 0448 11/20/20 0834 11/20/20 1208  GLUCAP 241* 206* 159* 112* 198*   D-Dimer Recent Labs    11/19/20 0135 11/20/20 0037  DDIMER 2.40* 2.61*   Hgb A1c No results for input(s): HGBA1C in the last 72 hours. Lipid Profile No results for input(s): CHOL, HDL, LDLCALC, TRIG, CHOLHDL,  LDLDIRECT in the last 72 hours. Thyroid function studies No results for input(s): TSH, T4TOTAL, T3FREE, THYROIDAB in the last 72 hours.  Invalid input(s): FREET3 Anemia work up Recent Labs    11/19/20 0135 11/20/20 0037  FERRITIN 293 291   Urinalysis    Component Value Date/Time   COLORURINE YELLOW 11/14/2020 2216   APPEARANCEUR HAZY (A) 11/14/2020 2216   LABSPEC 1.008 11/14/2020 2216   PHURINE 6.0 11/14/2020 2216   GLUCOSEU NEGATIVE 11/14/2020 2216   HGBUR MODERATE (A) 11/14/2020 2216   [place] 11/14/2020 2216   BILIRUBINUR neg 02/15/2012 1523   KETONESUR NEGATIVE 11/14/2020 2216   PROTEINUR 100 (A) 11/14/2020 2216   UROBILINOGEN 0.2 11/21/2012 1035   NITRITE NEGATIVE 11/14/2020 2216   LEUKOCYTESUR TRACE (A) 11/14/2020 2216   Sepsis Labs Invalid input(s): PROCALCITONIN,  WBC,  LACTICIDVEN Microbiology Recent Results (from the past 240 hour(s))  Resp Panel by RT-PCR (Flu A&B, Covid) Nasopharyngeal Swab     Status: Abnormal   Collection Time: 11/15/20  4:10 AM   Specimen: Nasopharyngeal Swab; Nasopharyngeal(NP) swabs in vial transport medium  Result Value Ref Range Status   SARS Coronavirus 2 by RT PCR POSITIVE (A) NEGATIVE Final    Comment: RESULT CALLED TO, READ BACK BY AND VERIFIED WITH: RN CHRIS CHRISCO AT 0602 BY MESSAN H. ON N1/08/2021 (NOTE) SARS-CoV-2 target nucleic acids are DETECTED.  The SARS-CoV-2 RNA is generally detectable in upper respiratory specimens during the acute phase of infection. Positive results are indicative of the presence of the identified virus, but do not rule out bacterial infection or co-infection with other pathogens not detected by the test. Clinical correlation with patient history and other diagnostic information is necessary to determine patient infection status. The expected result is Negative.  Fact Sheet for Patients: EntrepreneurPulse.com.au  Fact Sheet for Healthcare  Providers: IncredibleEmployment.be  This test is not yet approved or cleared by the [place] FDA and  has been authorized for detection and/or diagnosis of SARS-CoV-2 by FDA under an Emergency Use Authorization (EUA).  This EUA will remain in effect (meanin g this test can be used) for the duration of  the COVID-19 declaration under Section 564(b)(1) of the Act, 21 U.S.C. section 360bbb-3(b)(1), unless the authorization is terminated or revoked sooner.     Influenza A by PCR NEGATIVE NEGATIVE Final   Influenza B by PCR NEGATIVE NEGATIVE Final    Comment: (NOTE) The Xpert Xpress SARS-CoV-2/FLU/RSV plus assay is intended as an aid in the diagnosis of influenza from Nasopharyngeal swab specimens and should not be used as a sole basis for treatment. Nasal washings and aspirates are unacceptable for Xpert Xpress SARS-CoV-2/FLU/RSV testing.  Fact Sheet for Patients: EntrepreneurPulse.com.au  Fact Sheet for Healthcare Providers: IncredibleEmployment.be  This test is not yet approved or cleared by the [place] FDA and has been authorized for detection and/or diagnosis of SARS-CoV-2 by FDA under an Emergency Use Authorization (EUA). This EUA will remain in effect (meaning this test can be used) for the duration of the COVID-19 declaration under Section 564(b)(1) of the Act, 21 U.S.C. section 360bbb-3(b)(1), unless the authorization  is terminated or revoked.  Performed at [facility], [place] [address] [place], [place] [address]   Urine culture     Status: Abnormal   Collection Time: 11/16/20  5:34 PM   Specimen: Urine, Random  Result Value Ref Range Status   Specimen Description URINE, RANDOM  Final   Special Requests   Final    NONE Performed at [facility], [place] [address], [place], [place] [address]    Culture MULTIPLE SPECIES PRESENT, SUGGEST RECOLLECTION (A)  Final   Report Status 11/18/2020 FINAL   Final     Time coordinating discharge: Over 30 minutes  SIGNED:   Little Ishikawa, DO [facility] 11/20/2020, 1:50 PM Pager   If 7PM-7AM, please contact night-coverage [email or web address]

## 2020-11-23 ENCOUNTER — Telehealth: Payer: Self-pay | Admitting: Family Medicine

## 2020-11-23 NOTE — Telephone Encounter (Signed)
Transition Care Management Unsuccessful Follow-up Telephone Call  Date of discharge and from where:  11/20/2020 from [facility]   Attempts:  1st Attempt  Reason for unsuccessful TCM follow-up call:  Left voice message

## 2020-11-26 ENCOUNTER — Other Ambulatory Visit: Payer: Self-pay | Admitting: *Deleted

## 2020-11-26 NOTE — Patient Outreach (Signed)
[facility]) Care Management  11/26/2020  Brenda Patterson 1941-01-13 [id number]  Telephone assessment PAC COVID 19 infection.  Brenda Patterson is feeling much better. She says she has hardly had any respiratory problems. She has all her meds. She needs to call Dr. Erick Blinks office for a follow up. Advised they may only want to do a telephone or video visit since she has had COVID and should still be in quarantine.  Patient was recently discharged from hospital and all medications have been reviewed. Outpatient Encounter Medications as of 11/26/2020  Medication Sig Note  . Ascorbic Acid (VITAMIN C) 1000 MG tablet Take 1,000 mg by mouth daily.   Marland Kitchen aspirin EC 81 MG tablet Take 81 mg by mouth daily.   . Calcium Carbonate (CALCIUM 600 PO) Take 600 mg by mouth 2 (two) times daily.    . carvedilol (COREG) 6.25 MG tablet TAKE 1 TABLET BY MOUTH  TWICE DAILY (Patient taking differently: Take 6.25 mg by mouth 2 (two) times daily with a meal.) 11/15/2020: Unk time  . Cholecalciferol (VITAMIN D) 50 MCG (2000 UT) tablet Take 2,000 Units by mouth daily.   . fluticasone (FLONASE) 50 MCG/ACT nasal spray Place 2 sprays into both nostrils daily. (Patient taking differently: Place 2 sprays into both nostrils daily as needed for allergies.)   . glipiZIDE (GLUCOTROL XL) 2.5 MG 24 hr tablet TAKE 1 TABLET BY MOUTH  DAILY WITH BREAKFAST (Patient taking differently: Take 2.5 mg by mouth daily with breakfast.)   . isosorbide mononitrate (IMDUR) 30 MG 24 hr tablet Take 1 tablet (30 mg total) by mouth daily.   Marland Kitchen levothyroxine (SYNTHROID) 25 MCG tablet TAKE 1 TABLET BY MOUTH ONCE DAILY WITH BREAKFAST (Patient taking differently: Take 25 mcg by mouth daily before breakfast.)   . loperamide (IMODIUM A-D) 2 MG tablet Take 1 tablet (2 mg total) by mouth 4 (four) times daily as needed for diarrhea or loose stools.   . Multiple Vitamin (MULTIVITAMIN) tablet Take 1 tablet by mouth daily.   . ondansetron  (ZOFRAN) 4 MG tablet Take 1 tablet (4 mg total) by mouth every 8 (eight) hours as needed for nausea or vomiting.   . predniSONE (DELTASONE) 10 MG tablet Take 4 tablets (40 mg total) by mouth daily for 3 days, THEN 3 tablets (30 mg total) daily for 3 days, THEN 2 tablets (20 mg total) daily for 3 days, THEN 1 tablet (10 mg total) daily for 3 days.   . pyridOXINE (VITAMIN B-6) 100 MG tablet Take 100 mg by mouth daily.   . rosuvastatin (CRESTOR) 20 MG tablet Take 1 tablet (20 mg total) by mouth daily.   . sertraline (ZOLOFT) 50 MG tablet TAKE 1 TABLET BY MOUTH  DAILY (Patient taking differently: Take 50 mg by mouth daily.)   . Zinc 220 (50 Zn) MG CAPS Take 220 mg by mouth daily.   Marland Kitchen albuterol (VENTOLIN HFA) 108 (90 Base) MCG/ACT inhaler Inhale 2 puffs into the lungs every 6 (six) hours as needed for wheezing or shortness of breath.    No facility-administered encounter medications on file as of 11/26/2020.   Pt denies any care management needs. NP to send letter and brochure for future needs.  Eulah Pont. Myrtie Neither, MSN, [facility] Gerontological Nurse Practitioner [facility] Care Management [phone]

## 2020-12-02 DIAGNOSIS — K5900 Constipation, unspecified: Secondary | ICD-10-CM | POA: Diagnosis not present

## 2020-12-02 DIAGNOSIS — Z79899 Other long term (current) drug therapy: Secondary | ICD-10-CM | POA: Diagnosis not present

## 2020-12-02 DIAGNOSIS — I083 Combined rheumatic disorders of mitral, aortic and tricuspid valves: Secondary | ICD-10-CM | POA: Diagnosis not present

## 2020-12-02 DIAGNOSIS — U071 COVID-19: Secondary | ICD-10-CM | POA: Diagnosis not present

## 2020-12-02 DIAGNOSIS — I5020 Unspecified systolic (congestive) heart failure: Secondary | ICD-10-CM | POA: Insufficient documentation

## 2020-12-02 DIAGNOSIS — Z8616 Personal history of COVID-19: Secondary | ICD-10-CM | POA: Diagnosis not present

## 2020-12-02 DIAGNOSIS — E872 Acidosis, unspecified: Secondary | ICD-10-CM | POA: Insufficient documentation

## 2020-12-02 DIAGNOSIS — I5022 Chronic systolic (congestive) heart failure: Secondary | ICD-10-CM | POA: Diagnosis not present

## 2020-12-02 DIAGNOSIS — R0600 Dyspnea, unspecified: Secondary | ICD-10-CM | POA: Diagnosis not present

## 2020-12-02 DIAGNOSIS — Z7901 Long term (current) use of anticoagulants: Secondary | ICD-10-CM | POA: Diagnosis not present

## 2020-12-02 DIAGNOSIS — E785 Hyperlipidemia, unspecified: Secondary | ICD-10-CM | POA: Diagnosis not present

## 2020-12-02 DIAGNOSIS — Z22321 Carrier or suspected carrier of Methicillin susceptible Staphylococcus aureus: Secondary | ICD-10-CM | POA: Diagnosis not present

## 2020-12-02 DIAGNOSIS — J9601 Acute respiratory failure with hypoxia: Secondary | ICD-10-CM | POA: Diagnosis not present

## 2020-12-02 DIAGNOSIS — J984 Other disorders of lung: Secondary | ICD-10-CM | POA: Diagnosis not present

## 2020-12-02 DIAGNOSIS — I272 Pulmonary hypertension, unspecified: Secondary | ICD-10-CM | POA: Diagnosis not present

## 2020-12-02 DIAGNOSIS — E1122 Type 2 diabetes mellitus with diabetic chronic kidney disease: Secondary | ICD-10-CM | POA: Diagnosis not present

## 2020-12-02 DIAGNOSIS — I252 Old myocardial infarction: Secondary | ICD-10-CM | POA: Diagnosis not present

## 2020-12-02 DIAGNOSIS — J1569 Pneumonia due to other gram-negative bacteria: Secondary | ICD-10-CM | POA: Insufficient documentation

## 2020-12-02 DIAGNOSIS — Z880 Allergy status to penicillin: Secondary | ICD-10-CM | POA: Diagnosis not present

## 2020-12-02 DIAGNOSIS — I130 Hypertensive heart and chronic kidney disease with heart failure and stage 1 through stage 4 chronic kidney disease, or unspecified chronic kidney disease: Secondary | ICD-10-CM | POA: Diagnosis not present

## 2020-12-02 DIAGNOSIS — N281 Cyst of kidney, acquired: Secondary | ICD-10-CM | POA: Diagnosis not present

## 2020-12-02 DIAGNOSIS — N183 Chronic kidney disease, stage 3 unspecified: Secondary | ICD-10-CM | POA: Diagnosis not present

## 2020-12-02 DIAGNOSIS — R0602 Shortness of breath: Secondary | ICD-10-CM | POA: Diagnosis not present

## 2020-12-02 DIAGNOSIS — K449 Diaphragmatic hernia without obstruction or gangrene: Secondary | ICD-10-CM | POA: Diagnosis not present

## 2020-12-02 DIAGNOSIS — I447 Left bundle-branch block, unspecified: Secondary | ICD-10-CM | POA: Diagnosis not present

## 2020-12-02 DIAGNOSIS — J156 Pneumonia due to other aerobic Gram-negative bacteria: Secondary | ICD-10-CM | POA: Insufficient documentation

## 2020-12-02 DIAGNOSIS — R062 Wheezing: Secondary | ICD-10-CM | POA: Diagnosis not present

## 2020-12-02 DIAGNOSIS — N1832 Chronic kidney disease, stage 3b: Secondary | ICD-10-CM | POA: Diagnosis not present

## 2020-12-02 DIAGNOSIS — I214 Non-ST elevation (NSTEMI) myocardial infarction: Secondary | ICD-10-CM | POA: Diagnosis not present

## 2020-12-02 DIAGNOSIS — U099 Post covid-19 condition, unspecified: Secondary | ICD-10-CM | POA: Diagnosis not present

## 2020-12-02 DIAGNOSIS — I10 Essential (primary) hypertension: Secondary | ICD-10-CM | POA: Diagnosis not present

## 2020-12-02 DIAGNOSIS — R778 Other specified abnormalities of plasma proteins: Secondary | ICD-10-CM | POA: Diagnosis not present

## 2020-12-02 DIAGNOSIS — R918 Other nonspecific abnormal finding of lung field: Secondary | ICD-10-CM | POA: Diagnosis not present

## 2020-12-02 DIAGNOSIS — R1030 Lower abdominal pain, unspecified: Secondary | ICD-10-CM | POA: Diagnosis not present

## 2020-12-03 ENCOUNTER — Inpatient Hospital Stay: Payer: Medicare Other | Admitting: Family Medicine

## 2020-12-06 ENCOUNTER — Telehealth: Payer: Self-pay | Admitting: Family Medicine

## 2020-12-06 DIAGNOSIS — U071 COVID-19: Secondary | ICD-10-CM

## 2020-12-06 NOTE — Telephone Encounter (Signed)
Pts daughter is calling in stating that she is calling to see if they could get [facility] orders for the pt and they would like to use [facility] [phone] (P) Attn: Anderson Malta [phone] (F) (daughter stated that they have gotten okay through the insurance).  Daughter would like to have a call back.

## 2020-12-06 NOTE — Telephone Encounter (Signed)
OK to order [facility] services as requested.

## 2020-12-07 ENCOUNTER — Other Ambulatory Visit: Payer: Self-pay

## 2020-12-07 NOTE — Telephone Encounter (Addendum)
Spoke with daughter, she stated her mom needs therapy for strengthen, very weak.   Please put the patient in for a referral for home health  physical therapy.  See messages.   Thank you

## 2020-12-08 ENCOUNTER — Telehealth: Payer: Self-pay | Admitting: Family Medicine

## 2020-12-08 NOTE — Telephone Encounter (Signed)
Pt daughter call and want something call in for pt anxiety and want a call back. [place], [place] Phone:  [phone]  Fax:  [phone]

## 2020-12-08 NOTE — Telephone Encounter (Signed)
Patient is still taking sertraline.  Family and patient are ok waiting until appointment to discuss medications.

## 2020-12-08 NOTE — Telephone Encounter (Signed)
Home health referral placed and sent to [facility] [phone] (P) AttnAnderson Malta [phone] [facility] [phone] (P) AttnAnderson Malta [phone] (F)

## 2020-12-09 ENCOUNTER — Telehealth: Payer: Self-pay | Admitting: Family Medicine

## 2020-12-09 DIAGNOSIS — I5020 Unspecified systolic (congestive) heart failure: Secondary | ICD-10-CM

## 2020-12-09 DIAGNOSIS — Z7901 Long term (current) use of anticoagulants: Secondary | ICD-10-CM | POA: Diagnosis not present

## 2020-12-09 DIAGNOSIS — U071 COVID-19: Secondary | ICD-10-CM | POA: Diagnosis not present

## 2020-12-09 DIAGNOSIS — Z7984 Long term (current) use of oral hypoglycemic drugs: Secondary | ICD-10-CM | POA: Diagnosis not present

## 2020-12-09 DIAGNOSIS — R748 Abnormal levels of other serum enzymes: Secondary | ICD-10-CM | POA: Diagnosis not present

## 2020-12-09 DIAGNOSIS — J9601 Acute respiratory failure with hypoxia: Secondary | ICD-10-CM | POA: Diagnosis not present

## 2020-12-09 DIAGNOSIS — E785 Hyperlipidemia, unspecified: Secondary | ICD-10-CM | POA: Diagnosis not present

## 2020-12-09 DIAGNOSIS — M8580 Other specified disorders of bone density and structure, unspecified site: Secondary | ICD-10-CM

## 2020-12-09 DIAGNOSIS — Z7982 Long term (current) use of aspirin: Secondary | ICD-10-CM | POA: Diagnosis not present

## 2020-12-09 DIAGNOSIS — N1832 Chronic kidney disease, stage 3b: Secondary | ICD-10-CM | POA: Diagnosis not present

## 2020-12-09 DIAGNOSIS — I447 Left bundle-branch block, unspecified: Secondary | ICD-10-CM | POA: Diagnosis not present

## 2020-12-09 DIAGNOSIS — E1122 Type 2 diabetes mellitus with diabetic chronic kidney disease: Secondary | ICD-10-CM | POA: Diagnosis not present

## 2020-12-09 DIAGNOSIS — I130 Hypertensive heart and chronic kidney disease with heart failure and stage 1 through stage 4 chronic kidney disease, or unspecified chronic kidney disease: Secondary | ICD-10-CM | POA: Diagnosis not present

## 2020-12-09 DIAGNOSIS — J1282 Pneumonia due to coronavirus disease 2019: Secondary | ICD-10-CM | POA: Diagnosis not present

## 2020-12-09 DIAGNOSIS — I272 Pulmonary hypertension, unspecified: Secondary | ICD-10-CM | POA: Diagnosis not present

## 2020-12-09 DIAGNOSIS — E669 Obesity, unspecified: Secondary | ICD-10-CM

## 2020-12-09 NOTE — Telephone Encounter (Signed)
Brenda Patterson is calling to get verbal orders for [facility] starting today (12/09/2020) for PT with a frequency of 1 week 1  2 week 4 and 1 every two weeks times 2 for strengthening, energy conservation technique, endurance, training balance coordination and gait training. Need a prescription for a 3 in 1 raise toilet seat can be sent to any DME of your choice.  They will be taking weekly weight will a 7 +/- weight gain be acceptable to report?  May leave a detail msg on her secured voice mail.

## 2020-12-10 NOTE — Telephone Encounter (Signed)
OK to give verbal orders as requested.  Would report weekly weight gain over 5 pounds.

## 2020-12-10 NOTE — Telephone Encounter (Signed)
Verbal given and advised to change weight status.  Order sent to [facility] supply for raised toilet seat.

## 2020-12-13 ENCOUNTER — Telehealth (INDEPENDENT_AMBULATORY_CARE_PROVIDER_SITE_OTHER): Payer: Medicare Other | Admitting: Family Medicine

## 2020-12-13 ENCOUNTER — Telehealth: Payer: Self-pay | Admitting: Family Medicine

## 2020-12-13 DIAGNOSIS — Z7984 Long term (current) use of oral hypoglycemic drugs: Secondary | ICD-10-CM | POA: Diagnosis not present

## 2020-12-13 DIAGNOSIS — J1282 Pneumonia due to coronavirus disease 2019: Secondary | ICD-10-CM | POA: Diagnosis not present

## 2020-12-13 DIAGNOSIS — N1831 Chronic kidney disease, stage 3a: Secondary | ICD-10-CM

## 2020-12-13 DIAGNOSIS — N183 Chronic kidney disease, stage 3 unspecified: Secondary | ICD-10-CM | POA: Diagnosis not present

## 2020-12-13 DIAGNOSIS — I5020 Unspecified systolic (congestive) heart failure: Secondary | ICD-10-CM

## 2020-12-13 DIAGNOSIS — R748 Abnormal levels of other serum enzymes: Secondary | ICD-10-CM | POA: Diagnosis not present

## 2020-12-13 DIAGNOSIS — E1122 Type 2 diabetes mellitus with diabetic chronic kidney disease: Secondary | ICD-10-CM

## 2020-12-13 DIAGNOSIS — G4700 Insomnia, unspecified: Secondary | ICD-10-CM

## 2020-12-13 DIAGNOSIS — E785 Hyperlipidemia, unspecified: Secondary | ICD-10-CM | POA: Diagnosis not present

## 2020-12-13 DIAGNOSIS — Z7982 Long term (current) use of aspirin: Secondary | ICD-10-CM | POA: Diagnosis not present

## 2020-12-13 DIAGNOSIS — I130 Hypertensive heart and chronic kidney disease with heart failure and stage 1 through stage 4 chronic kidney disease, or unspecified chronic kidney disease: Secondary | ICD-10-CM | POA: Diagnosis not present

## 2020-12-13 DIAGNOSIS — Z7901 Long term (current) use of anticoagulants: Secondary | ICD-10-CM | POA: Diagnosis not present

## 2020-12-13 DIAGNOSIS — J9601 Acute respiratory failure with hypoxia: Secondary | ICD-10-CM | POA: Diagnosis not present

## 2020-12-13 DIAGNOSIS — N1832 Chronic kidney disease, stage 3b: Secondary | ICD-10-CM | POA: Diagnosis not present

## 2020-12-13 DIAGNOSIS — I272 Pulmonary hypertension, unspecified: Secondary | ICD-10-CM | POA: Diagnosis not present

## 2020-12-13 DIAGNOSIS — I447 Left bundle-branch block, unspecified: Secondary | ICD-10-CM | POA: Diagnosis not present

## 2020-12-13 DIAGNOSIS — U071 COVID-19: Secondary | ICD-10-CM | POA: Diagnosis not present

## 2020-12-13 MED ORDER — ZOLPIDEM TARTRATE 5 MG PO TABS: 5.0000 mg | ORAL_TABLET | Freq: Every evening | ORAL | 0 refills | Status: AC | PRN

## 2020-12-13 NOTE — Telephone Encounter (Signed)
[place]  She needs a verbal order to add on OT evaluation for this week  Start date 12/13/2020 for 1 week 1  Please advise

## 2020-12-13 NOTE — Telephone Encounter (Signed)
Verbal given for occupational therapy. 

## 2020-12-13 NOTE — Progress Notes (Signed)
Patient ID: Brenda Patterson, female   DOB: [birth year], 80 y.o.   MRN: [id number]   This visit type was conducted due to national recommendations for restrictions regarding the COVID-19 pandemic in an effort to limit this patient's exposure and mitigate transmission in our community.   Virtual Visit via Video Note  I connected with Brenda Patterson on 12/13/20 at  3:15 PM EST by a video enabled telemedicine application and verified that I am speaking with the correct person using two identifiers.  Location patient: home Location provider:work or home office Persons participating in the virtual visit: patient, provider  I discussed the limitations of evaluation and management by telemedicine and the availability of in person appointments. The patient expressed understanding and agreed to proceed.   HPI:  [name] has multiple chronic medical problems and was recently diagnosed with COVID back around January 6.  She also apparently had positive norovirus and had associated nausea and vomiting.  She was admitted to [facility] back January 9 through the 15th with multifocal pneumonia secondary to COVID with nausea, vomiting, diarrhea, dehydration.  She apparently had rhabdomyolysis and elevated troponins and new left bundle branch block with wall motion abnormalities concerning for myocarditis or ischemia.  She had worsening renal function in the setting of chronic kidney disease.  Echocardiogram revealed severely reduced systolic function at 20 to 25%.  ACE inhibitor's were held in the setting of renal insufficiency.  She did improve during that hospitalization and was discharged home but then developed some recurrent weakness and apparently was readmitted to [facility] for 5 or 6 days and is now back home.  She has home health services coming out.  Their major concern at this time is increased anxiety symptoms and particularly difficulty sleeping.  She is apparently getting very little  sleep at night.  They tried Tylenol PM without relief.  They try melatonin up to 3 mg without relief.  She does take sertraline 50 mg daily at baseline and family increase this to 100 mg and have not seen any improvement in her anxiety symptoms.  Her current medications include Eliquis 5 mg twice daily, furosemide 60 mg 3 times daily, hydralazine 50 mg 3 times daily, Coreg 12.5 mg twice daily, isosorbide 30 mg daily, aspirin 81 mg daily, rosuvastatin 20 mg daily, glipizide XL 2.5 mg daily, levothyroxine 25 mcg daily, and sertraline 100 mg daily  She is ambulating some with physical therapy but still very weak.  Respiratory status stable.  No fever.  Keeping down fluids.  No respiratory distress at rest.   ROS: See pertinent positives and negatives per HPI.  Past Medical History:  Diagnosis Date  . Allergy   . Arthritis   . 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 Diabetes mellitus   . Hyperlipidemia   . Hypertension   . LBBB (left bundle branch block) 11/15/2020  . Migraines     Past Surgical History:  Procedure Laterality Date  . CHOLECYSTECTOMY    . TUBAL LIGATION      Family History  Problem Relation Age of Onset  . Arthritis Other        fhx  . Hyperlipidemia Other        fhx  . Hypertension Other        fhx  . Diabetes Other        fhx  . Stroke Other        fhx  . Breast cancer Sister  SOCIAL HX: Lives at home with her husband.  Non-smoker   Current Outpatient Medications:  .  zolpidem (AMBIEN) 5 MG tablet, Take 1 tablet (5 mg total) by mouth at bedtime as needed for sleep., Disp: 10 tablet, Rfl: 0 .  albuterol (VENTOLIN HFA) 108 (90 Base) MCG/ACT inhaler, Inhale 2 puffs into the lungs every 6 (six) hours as needed for wheezing or shortness of breath., Disp: 18 g, Rfl: 3 .  Ascorbic Acid (VITAMIN C) 1000 MG tablet, Take 1,000 mg by mouth daily., Disp: , Rfl:  .  aspirin EC 81 MG tablet, Take 81 mg by mouth daily., Disp: , Rfl:  .  Calcium  Carbonate (CALCIUM 600 PO), Take 600 mg by mouth 2 (two) times daily. , Disp: , Rfl:  .  carvedilol (COREG) 6.25 MG tablet, TAKE 1 TABLET BY MOUTH  TWICE DAILY (Patient taking differently: Take 6.25 mg by mouth 2 (two) times daily with a meal.), Disp: 180 tablet, Rfl: 3 .  Cholecalciferol (VITAMIN D) 50 MCG (2000 UT) tablet, Take 2,000 Units by mouth daily., Disp: , Rfl:  .  fluticasone (FLONASE) 50 MCG/ACT nasal spray, Place 2 sprays into both nostrils daily. (Patient taking differently: Place 2 sprays into both nostrils daily as needed for allergies.), Disp: 16 g, Rfl: 2 .  glipiZIDE (GLUCOTROL XL) 2.5 MG 24 hr tablet, TAKE 1 TABLET BY MOUTH  DAILY WITH BREAKFAST (Patient taking differently: Take 2.5 mg by mouth daily with breakfast.), Disp: 90 tablet, Rfl: 3 .  isosorbide mononitrate (IMDUR) 30 MG 24 hr tablet, Take 1 tablet (30 mg total) by mouth daily., Disp: 30 tablet, Rfl: 1 .  levothyroxine (SYNTHROID) 25 MCG tablet, TAKE 1 TABLET BY MOUTH ONCE DAILY WITH BREAKFAST (Patient taking differently: Take 25 mcg by mouth daily before breakfast.), Disp: 90 tablet, Rfl: 3 .  loperamide (IMODIUM A-D) 2 MG tablet, Take 1 tablet (2 mg total) by mouth 4 (four) times daily as needed for diarrhea or loose stools., Disp: 30 tablet, Rfl: 0 .  Multiple Vitamin (MULTIVITAMIN) tablet, Take 1 tablet by mouth daily., Disp: , Rfl:  .  ondansetron (ZOFRAN) 4 MG tablet, Take 1 tablet (4 mg total) by mouth every 8 (eight) hours as needed for nausea or vomiting., Disp: 10 tablet, Rfl: 0 .  pyridOXINE (VITAMIN B-6) 100 MG tablet, Take 100 mg by mouth daily., Disp: , Rfl:  .  rosuvastatin (CRESTOR) 20 MG tablet, Take 1 tablet (20 mg total) by mouth daily., Disp: 90 tablet, Rfl: 3 .  sertraline (ZOLOFT) 50 MG tablet, TAKE 1 TABLET BY MOUTH  DAILY (Patient taking differently: Take 50 mg by mouth daily.), Disp: 90 tablet, Rfl: 3 .  Zinc 220 (50 Zn) MG CAPS, Take 220 mg by mouth daily., Disp: , Rfl:   EXAM:  VITALS per  patient if applicable:  GENERAL: alert, oriented, appears well and in no acute distress  HEENT: atraumatic, conjunttiva clear, no obvious abnormalities on inspection of external nose and ears  NECK: normal movements of the head and neck  LUNGS: on inspection no signs of respiratory distress, breathing rate appears normal, no obvious gross SOB, gasping or wheezing  CV: no obvious cyanosis  MS: moves all visible extremities without noticeable abnormality  PSYCH/NEURO: pleasant and cooperative, no obvious depression or anxiety, speech and thought processing grossly intact  ASSESSMENT AND PLAN:  Discussed the following assessment and plan:  #1 recent Covid multifocal pneumonia with a couple of hospitalizations.  Overall improved and stable at this time  from a respiratory standpoint.  #2 recent acute kidney injury secondary to severe dehydration in the setting of chronic kidney disease.  Patient will need follow-up labs soon.  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  ACE inhibitor was being held because of her acute kidney injury.  She is on multiple medications as above.  -We will discuss at her follow-up Wednesday possible referral to congestive heart failure clinic.  #4 type 2 diabetes.  Currently only treated with low-dose glipizide.  She is not a candidate at this point for Metformin because of her renal function  #5 dyslipidemia with goal LDL less than 70  #6 hypothyroidism treated with low-dose levothyroxine  #7 history of recurrent depression currently treated with sertraline.  Repeat PHQ-9 at follow-up  #8 insomnia worsened since recent hospitalizations  -We discussed sleep hygiene.  Recommend avoid excessive daytime napping. -Avoid late day use of caffeine -Try going up to 5 mg of melatonin.  If not seeing relief with that we wrote for low-dose Ambien 5 mg nightly #10  with no refill -Try to avoid benzodiazepines to reduce risk of falls.     I discussed the assessment and treatment plan with the patient. The patient was provided an opportunity to ask questions and all were answered. The patient agreed with the plan and demonstrated an understanding of the instructions.   The patient was advised to call back or seek an in-person evaluation if the symptoms worsen or if the condition fails to improve as anticipated.     Carolann Littler, MD

## 2020-12-14 DIAGNOSIS — Z7901 Long term (current) use of anticoagulants: Secondary | ICD-10-CM | POA: Diagnosis not present

## 2020-12-14 DIAGNOSIS — R748 Abnormal levels of other serum enzymes: Secondary | ICD-10-CM | POA: Diagnosis not present

## 2020-12-14 DIAGNOSIS — E1122 Type 2 diabetes mellitus with diabetic chronic kidney disease: Secondary | ICD-10-CM | POA: Diagnosis not present

## 2020-12-14 DIAGNOSIS — Z7984 Long term (current) use of oral hypoglycemic drugs: Secondary | ICD-10-CM | POA: Diagnosis not present

## 2020-12-14 DIAGNOSIS — I130 Hypertensive heart and chronic kidney disease with heart failure and stage 1 through stage 4 chronic kidney disease, or unspecified chronic kidney disease: Secondary | ICD-10-CM | POA: Diagnosis not present

## 2020-12-14 DIAGNOSIS — J9601 Acute respiratory failure with hypoxia: Secondary | ICD-10-CM | POA: Diagnosis not present

## 2020-12-14 DIAGNOSIS — J1282 Pneumonia due to coronavirus disease 2019: Secondary | ICD-10-CM | POA: Diagnosis not present

## 2020-12-14 DIAGNOSIS — I272 Pulmonary hypertension, unspecified: Secondary | ICD-10-CM | POA: Diagnosis not present

## 2020-12-14 DIAGNOSIS — E785 Hyperlipidemia, unspecified: Secondary | ICD-10-CM | POA: Diagnosis not present

## 2020-12-14 DIAGNOSIS — I5020 Unspecified systolic (congestive) heart failure: Secondary | ICD-10-CM | POA: Diagnosis not present

## 2020-12-14 DIAGNOSIS — N1832 Chronic kidney disease, stage 3b: Secondary | ICD-10-CM | POA: Diagnosis not present

## 2020-12-14 DIAGNOSIS — Z7982 Long term (current) use of aspirin: Secondary | ICD-10-CM | POA: Diagnosis not present

## 2020-12-14 DIAGNOSIS — I447 Left bundle-branch block, unspecified: Secondary | ICD-10-CM | POA: Diagnosis not present

## 2020-12-14 DIAGNOSIS — U071 COVID-19: Secondary | ICD-10-CM | POA: Diagnosis not present

## 2020-12-15 ENCOUNTER — Emergency Department (HOSPITAL_COMMUNITY): Payer: Medicare Other

## 2020-12-15 ENCOUNTER — Inpatient Hospital Stay (HOSPITAL_COMMUNITY)
Admission: EM | Admit: 2020-12-15 | Discharge: 2021-01-04 | DRG: 377 | Disposition: E | Payer: Medicare Other | Attending: Internal Medicine | Admitting: Internal Medicine

## 2020-12-15 ENCOUNTER — Encounter: Payer: Self-pay | Admitting: Family Medicine

## 2020-12-15 ENCOUNTER — Inpatient Hospital Stay: Payer: Medicare Other | Admitting: Family Medicine

## 2020-12-15 ENCOUNTER — Encounter (HOSPITAL_COMMUNITY): Payer: Self-pay | Admitting: Emergency Medicine

## 2020-12-15 DIAGNOSIS — R4020 Unspecified coma: Secondary | ICD-10-CM | POA: Diagnosis not present

## 2020-12-15 DIAGNOSIS — I5022 Chronic systolic (congestive) heart failure: Secondary | ICD-10-CM | POA: Diagnosis not present

## 2020-12-15 DIAGNOSIS — N183 Chronic kidney disease, stage 3 unspecified: Secondary | ICD-10-CM | POA: Diagnosis not present

## 2020-12-15 DIAGNOSIS — Z79899 Other long term (current) drug therapy: Secondary | ICD-10-CM

## 2020-12-15 DIAGNOSIS — Z7982 Long term (current) use of aspirin: Secondary | ICD-10-CM

## 2020-12-15 DIAGNOSIS — K649 Unspecified hemorrhoids: Secondary | ICD-10-CM | POA: Diagnosis not present

## 2020-12-15 DIAGNOSIS — Z7189 Other specified counseling: Secondary | ICD-10-CM | POA: Diagnosis not present

## 2020-12-15 DIAGNOSIS — Z8701 Personal history of pneumonia (recurrent): Secondary | ICD-10-CM | POA: Diagnosis not present

## 2020-12-15 DIAGNOSIS — Z66 Do not resuscitate: Secondary | ICD-10-CM | POA: Diagnosis not present

## 2020-12-15 DIAGNOSIS — D649 Anemia, unspecified: Secondary | ICD-10-CM | POA: Diagnosis not present

## 2020-12-15 DIAGNOSIS — D62 Acute posthemorrhagic anemia: Secondary | ICD-10-CM | POA: Diagnosis not present

## 2020-12-15 DIAGNOSIS — Z7984 Long term (current) use of oral hypoglycemic drugs: Secondary | ICD-10-CM | POA: Diagnosis not present

## 2020-12-15 DIAGNOSIS — E039 Hypothyroidism, unspecified: Secondary | ICD-10-CM | POA: Diagnosis present

## 2020-12-15 DIAGNOSIS — I10 Essential (primary) hypertension: Secondary | ICD-10-CM | POA: Diagnosis not present

## 2020-12-15 DIAGNOSIS — N271 Small kidney, bilateral: Secondary | ICD-10-CM | POA: Diagnosis not present

## 2020-12-15 DIAGNOSIS — I517 Cardiomegaly: Secondary | ICD-10-CM | POA: Diagnosis not present

## 2020-12-15 DIAGNOSIS — Z8616 Personal history of COVID-19: Secondary | ICD-10-CM

## 2020-12-15 DIAGNOSIS — N179 Acute kidney failure, unspecified: Secondary | ICD-10-CM | POA: Diagnosis not present

## 2020-12-15 DIAGNOSIS — Z79890 Hormone replacement therapy (postmenopausal): Secondary | ICD-10-CM | POA: Diagnosis not present

## 2020-12-15 DIAGNOSIS — E1122 Type 2 diabetes mellitus with diabetic chronic kidney disease: Secondary | ICD-10-CM | POA: Diagnosis not present

## 2020-12-15 DIAGNOSIS — N184 Chronic kidney disease, stage 4 (severe): Secondary | ICD-10-CM | POA: Diagnosis not present

## 2020-12-15 DIAGNOSIS — S22089A Unspecified fracture of T11-T12 vertebra, initial encounter for closed fracture: Secondary | ICD-10-CM | POA: Diagnosis not present

## 2020-12-15 DIAGNOSIS — J9601 Acute respiratory failure with hypoxia: Secondary | ICD-10-CM | POA: Diagnosis not present

## 2020-12-15 DIAGNOSIS — M47816 Spondylosis without myelopathy or radiculopathy, lumbar region: Secondary | ICD-10-CM | POA: Diagnosis not present

## 2020-12-15 DIAGNOSIS — K5641 Fecal impaction: Secondary | ICD-10-CM | POA: Diagnosis not present

## 2020-12-15 DIAGNOSIS — R195 Other fecal abnormalities: Secondary | ICD-10-CM | POA: Diagnosis not present

## 2020-12-15 DIAGNOSIS — E119 Type 2 diabetes mellitus without complications: Secondary | ICD-10-CM

## 2020-12-15 DIAGNOSIS — R627 Adult failure to thrive: Secondary | ICD-10-CM | POA: Diagnosis present

## 2020-12-15 DIAGNOSIS — J811 Chronic pulmonary edema: Secondary | ICD-10-CM | POA: Diagnosis not present

## 2020-12-15 DIAGNOSIS — E785 Hyperlipidemia, unspecified: Secondary | ICD-10-CM | POA: Diagnosis present

## 2020-12-15 DIAGNOSIS — I429 Cardiomyopathy, unspecified: Secondary | ICD-10-CM | POA: Diagnosis not present

## 2020-12-15 DIAGNOSIS — J9811 Atelectasis: Secondary | ICD-10-CM | POA: Diagnosis not present

## 2020-12-15 DIAGNOSIS — K922 Gastrointestinal hemorrhage, unspecified: Secondary | ICD-10-CM | POA: Diagnosis not present

## 2020-12-15 DIAGNOSIS — R9431 Abnormal electrocardiogram [ECG] [EKG]: Secondary | ICD-10-CM | POA: Diagnosis not present

## 2020-12-15 DIAGNOSIS — R0902 Hypoxemia: Secondary | ICD-10-CM

## 2020-12-15 DIAGNOSIS — R21 Rash and other nonspecific skin eruption: Secondary | ICD-10-CM | POA: Diagnosis not present

## 2020-12-15 DIAGNOSIS — I130 Hypertensive heart and chronic kidney disease with heart failure and stage 1 through stage 4 chronic kidney disease, or unspecified chronic kidney disease: Secondary | ICD-10-CM | POA: Diagnosis not present

## 2020-12-15 DIAGNOSIS — R531 Weakness: Secondary | ICD-10-CM | POA: Diagnosis not present

## 2020-12-15 DIAGNOSIS — E860 Dehydration: Secondary | ICD-10-CM | POA: Diagnosis not present

## 2020-12-15 DIAGNOSIS — Z515 Encounter for palliative care: Secondary | ICD-10-CM

## 2020-12-15 DIAGNOSIS — D500 Iron deficiency anemia secondary to blood loss (chronic): Secondary | ICD-10-CM | POA: Diagnosis not present

## 2020-12-15 DIAGNOSIS — K5900 Constipation, unspecified: Secondary | ICD-10-CM | POA: Diagnosis not present

## 2020-12-15 DIAGNOSIS — Z9049 Acquired absence of other specified parts of digestive tract: Secondary | ICD-10-CM

## 2020-12-15 LAB — IRON AND TIBC
Iron: 39 ug/dL (ref 28–170)
Saturation Ratios: 16 % (ref 10.4–31.8)
TIBC: 239 ug/dL — ABNORMAL LOW (ref 250–450)
UIBC: 200 ug/dL

## 2020-12-15 LAB — COMPREHENSIVE METABOLIC PANEL
ALT: 23 U/L (ref 0–44)
AST: 24 U/L (ref 15–41)
Albumin: 2.4 g/dL — ABNORMAL LOW (ref 3.5–5.0)
Alkaline Phosphatase: 45 U/L (ref 38–126)
Anion gap: 13 (ref 5–15)
BUN: 72 mg/dL — ABNORMAL HIGH (ref 8–23)
CO2: 23 mmol/L (ref 22–32)
Calcium: 7.9 mg/dL — ABNORMAL LOW (ref 8.9–10.3)
Chloride: 100 mmol/L (ref 98–111)
Creatinine, Ser: 5.16 mg/dL — ABNORMAL HIGH (ref 0.44–1.00)
GFR, Estimated: 8 mL/min — ABNORMAL LOW (ref >60)
Glucose, Bld: 144 mg/dL — ABNORMAL HIGH (ref 70–99)
Potassium: 4.2 mmol/L (ref 3.5–5.1)
Sodium: 136 mmol/L (ref 135–145)
Total Bilirubin: 0.7 mg/dL (ref 0.3–1.2)
Total Protein: 5 g/dL — ABNORMAL LOW (ref 6.5–8.1)

## 2020-12-15 LAB — BRAIN NATRIURETIC PEPTIDE: B Natriuretic Peptide: 1827.3 pg/mL — ABNORMAL HIGH (ref 0.0–100.0)

## 2020-12-15 LAB — CBC WITH DIFFERENTIAL/PLATELET
Abs Immature Granulocytes: 0.02 10*3/uL (ref 0.00–0.07)
Basophils Absolute: 0 10*3/uL (ref 0.0–0.1)
Basophils Relative: 0 %
Eosinophils Absolute: 0.1 10*3/uL (ref 0.0–0.5)
Eosinophils Relative: 2 %
HCT: 23.8 % — ABNORMAL LOW (ref 36.0–46.0)
Hemoglobin: 7.5 g/dL — ABNORMAL LOW (ref 12.0–15.0)
Immature Granulocytes: 0 %
Lymphocytes Relative: 19 %
Lymphs Abs: 1.1 10*3/uL (ref 0.7–4.0)
MCH: 30.4 pg (ref 26.0–34.0)
MCHC: 31.5 g/dL (ref 30.0–36.0)
MCV: 96.4 fL (ref 80.0–100.0)
Monocytes Absolute: 0.6 10*3/uL (ref 0.1–1.0)
Monocytes Relative: 11 %
Neutro Abs: 3.7 10*3/uL (ref 1.7–7.7)
Neutrophils Relative %: 68 %
Platelets: 224 10*3/uL (ref 150–400)
RBC: 2.47 MIL/uL — ABNORMAL LOW (ref 3.87–5.11)
RDW: 13.6 % (ref 11.5–15.5)
WBC: 5.5 10*3/uL (ref 4.0–10.5)
nRBC: 0 % (ref 0.0–0.2)

## 2020-12-15 LAB — LACTIC ACID, PLASMA
Lactic Acid, Venous: 1 mmol/L (ref 0.5–1.9)
Lactic Acid, Venous: 1.4 mmol/L (ref 0.5–1.9)

## 2020-12-15 LAB — RETICULOCYTES
Immature Retic Fract: 8.3 % (ref 2.3–15.9)
RBC.: 2.66 MIL/uL — ABNORMAL LOW (ref 3.87–5.11)
Retic Count, Absolute: 58.8 10*3/uL (ref 19.0–186.0)
Retic Ct Pct: 2.2 % (ref 0.4–3.1)

## 2020-12-15 LAB — TROPONIN I (HIGH SENSITIVITY)
Troponin I (High Sensitivity): 410 ng/L (ref <18)
Troponin I (High Sensitivity): 395 ng/L (ref <18)

## 2020-12-15 LAB — FERRITIN: Ferritin: 176 ng/mL (ref 11–307)

## 2020-12-15 LAB — PREPARE RBC (CROSSMATCH)

## 2020-12-15 LAB — FOLATE: Folate: 14.1 ng/mL (ref >5.9)

## 2020-12-15 LAB — POC OCCULT BLOOD, ED: Fecal Occult Bld: POSITIVE — AB

## 2020-12-15 LAB — MAGNESIUM: Magnesium: 2.2 mg/dL (ref 1.7–2.4)

## 2020-12-15 LAB — VITAMIN B12: Vitamin B-12: 186 pg/mL (ref 180–914)

## 2020-12-15 LAB — CBG MONITORING, ED: Glucose-Capillary: 95 mg/dL (ref 70–99)

## 2020-12-15 MED ORDER — ACETAMINOPHEN 650 MG RE SUPP: 650.0000 mg | Freq: Four times a day (QID) | RECTAL | Status: DC | PRN

## 2020-12-15 MED ORDER — CARVEDILOL 6.25 MG PO TABS
6.2500 mg | ORAL_TABLET | Freq: Two times a day (BID) | ORAL | Status: DC
  Administered 2020-12-16 – 2020-12-18 (×5): 6.25 mg via ORAL
  Filled 2020-12-15 (×5): qty 1

## 2020-12-15 MED ORDER — ACETAMINOPHEN 325 MG PO TABS: 650.0000 mg | ORAL_TABLET | Freq: Four times a day (QID) | ORAL | Status: DC | PRN

## 2020-12-15 MED ORDER — SERTRALINE HCL 50 MG PO TABS
50.0000 mg | ORAL_TABLET | Freq: Every day | ORAL | Status: DC
  Administered 2020-12-16 – 2020-12-18 (×3): 50 mg via ORAL
  Filled 2020-12-15 (×4): qty 1

## 2020-12-15 MED ORDER — LEVOTHYROXINE SODIUM 25 MCG PO TABS
25.0000 ug | ORAL_TABLET | Freq: Every day | ORAL | Status: DC
  Administered 2020-12-16 – 2020-12-18 (×3): 25 ug via ORAL
  Filled 2020-12-15 (×3): qty 1

## 2020-12-15 MED ORDER — ALBUTEROL SULFATE HFA 108 (90 BASE) MCG/ACT IN AERS
2.0000 | INHALATION_SPRAY | Freq: Four times a day (QID) | RESPIRATORY_TRACT | Status: DC | PRN
  Administered 2020-12-18: 2 via RESPIRATORY_TRACT
  Filled 2020-12-15: qty 6.7

## 2020-12-15 MED ORDER — INSULIN ASPART 100 UNIT/ML ~~LOC~~ SOLN
0.0000 [IU] | SUBCUTANEOUS | Status: DC
  Administered 2020-12-17: 2 [IU] via SUBCUTANEOUS
  Administered 2020-12-18: 1 [IU] via SUBCUTANEOUS

## 2020-12-15 MED ORDER — ROSUVASTATIN CALCIUM 20 MG PO TABS
20.0000 mg | ORAL_TABLET | Freq: Every day | ORAL | Status: DC
  Administered 2020-12-16 – 2020-12-18 (×3): 20 mg via ORAL
  Filled 2020-12-15 (×3): qty 1

## 2020-12-15 MED ORDER — SODIUM CHLORIDE 0.9 % IV SOLN
10.0000 mL/h | Freq: Once | INTRAVENOUS | Status: AC
  Administered 2020-12-15: 10 mL/h via INTRAVENOUS

## 2020-12-15 MED ORDER — ISOSORBIDE MONONITRATE ER 30 MG PO TB24
30.0000 mg | ORAL_TABLET | Freq: Every day | ORAL | Status: DC
  Administered 2020-12-16 – 2020-12-18 (×3): 30 mg via ORAL
  Filled 2020-12-15 (×3): qty 1

## 2020-12-15 MED ORDER — ZOLPIDEM TARTRATE 5 MG PO TABS: 5.0000 mg | ORAL_TABLET | Freq: Every evening | ORAL | Status: DC | PRN

## 2020-12-15 MED ORDER — PANTOPRAZOLE SODIUM 40 MG IV SOLR
40.0000 mg | Freq: Two times a day (BID) | INTRAVENOUS | Status: DC
  Administered 2020-12-16 – 2020-12-18 (×6): 40 mg via INTRAVENOUS
  Filled 2020-12-15 (×6): qty 40

## 2020-12-15 NOTE — ED Provider Notes (Signed)
[facility] EMERGENCY DEPARTMENT Provider Note   CSN: [id number] Arrival date & time: 12/27/2020  1730     History No chief complaint on file.   Brenda Patterson is a 80 y.o. female.  Brenda Patterson is a 80 y.o. female with a history of hypertension, hyperlipidemia, diabetes, migraines, and recent Covid infection with acute renal failure and myocarditis, who presents today due to increasing weakness and altered mental status.  Patient reports that she has been much more fatigued over the past few days, but today could not even stand up on her own, family felt that she seemed more confused and less responsive than usual.  She denies any fevers.  No chest pain, has had some mild shortness of breath, which she reports is worse with activity.  Patient does report some generalized abdominal pain over the past few days, no vomiting.  Also reports some constipation.  Has not seen any blood in her stool.  Does report some urinary frequency over the past 2 days.  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  Patient able to provide limited history.        Past Medical History:  Diagnosis Date  . Allergy   . Arthritis   . 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 Diabetes mellitus   . Hyperlipidemia   . Hypertension   . LBBB (left bundle branch block) 11/15/2020  . Migraines     Patient Active Problem List   Diagnosis Date Noted  . Acute GI bleeding 12/08/2020  . ARF (acute renal failure) ([place]) 12/18/2020  . Symptomatic anemia 12/23/2020  . Acute respiratory failure with hypoxia ([place]) 12/02/2020  . Gram-negative pneumonia ([place]) 12/02/2020  . Lactic acidosis 12/02/2020  . Obstipation 12/02/2020  . Pulmonary hypertension ([place]) 12/02/2020  . Systolic heart failure ([place]) 12/02/2020  . Elevated troponin  11/15/2020  . Abnormal ECG 11/15/2020  . AKI (acute kidney injury) ([place]) 11/15/2020  . LBBB (left bundle branch block) 11/15/2020  . Pneumonia due to COVID-19 virus 11/14/2020  . Elevated TSH 02/21/2019  . Osteopenia 10/22/2017  . Acute frontal sinusitis 11/11/2015  . CKD (chronic kidney disease) stage 3, GFR 30-59 ml/min ([facility]) 10/26/2015  . Anemia of chronic disease   . PNA (pneumonia) 12/26/2014  . Nausea vomiting and diarrhea 01/13/2014  . Obesity (BMI 30-39.9) 09/25/2013  . INGROWN TOENAIL 06/16/2010  . Hyperlipidemia 10/27/2009  . History of depression 10/27/2009  . ANEMIA, HX OF 03/25/2009  . Type 2 diabetes mellitus, controlled ([place]) 02/25/2009  . Essential hypertension 02/25/2009  . Allergic rhinitis 02/25/2009    Past Surgical History:  Procedure Laterality Date  . CHOLECYSTECTOMY    . TUBAL LIGATION       OB History   No obstetric history on file.     Family History  Problem Relation Age of Onset  . Arthritis Other        fhx  . Hyperlipidemia Other        fhx  . Hypertension Other        fhx  . Diabetes Other        fhx  . Stroke Other        fhx  . Breast cancer Sister     Social History   Tobacco Use  . Smoking status: Never Smoker  .  Smokeless tobacco: Never Used  Vaping Use  . Vaping Use: Never used  Substance Use Topics  . Alcohol use: No  . Drug use: No    Home Medications Prior to Admission medications   Medication Sig Start Date End Date Taking? Authorizing Provider  albuterol (VENTOLIN HFA) 108 (90 Base) MCG/ACT inhaler Inhale 2 puffs into the lungs every 6 (six) hours as needed for wheezing or shortness of breath. 08/10/20 09/09/20  Burchette, Alinda Sierras, MD  Ascorbic Acid (VITAMIN C) 1000 MG tablet Take 1,000 mg by mouth daily.    [provider]  aspirin EC 81 MG tablet Take 81 mg by mouth daily.    [provider]  Calcium Carbonate (CALCIUM 600 PO) Take 600 mg by mouth 2 (two) times daily.     [provider]  carvedilol (COREG) 6.25 MG tablet TAKE 1 TABLET BY MOUTH  TWICE DAILY Patient taking differently: Take 6.25 mg by mouth 2 (two) times daily with a meal. 06/23/20   Burchette, Alinda Sierras, MD  Cholecalciferol (VITAMIN D) 50 MCG (2000 UT) tablet Take 2,000 Units by mouth daily.    [provider]  fluticasone (FLONASE) 50 MCG/ACT nasal spray Place 2 sprays into both nostrils daily. Patient taking differently: Place 2 sprays into both nostrils daily as needed for allergies. 11/05/17   Noemi Chapel, MD  glipiZIDE (GLUCOTROL XL) 2.5 MG 24 hr tablet TAKE 1 TABLET BY MOUTH  DAILY WITH BREAKFAST Patient taking differently: Take 2.5 mg by mouth daily with breakfast. 06/23/20   Burchette, Alinda Sierras, MD  isosorbide mononitrate (IMDUR) 30 MG 24 hr tablet Take 1 tablet (30 mg total) by mouth daily. 11/20/20   Little Ishikawa, MD  levothyroxine (SYNTHROID) 25 MCG tablet TAKE 1 TABLET BY MOUTH ONCE DAILY WITH BREAKFAST Patient taking differently: Take 25 mcg by mouth daily before breakfast. 08/18/20   Burchette, Alinda Sierras, MD  loperamide (IMODIUM A-D) 2 MG tablet Take 1 tablet (2 mg total) by mouth 4 (four) times daily as needed for diarrhea or loose stools. 11/12/20   Fredia Sorrow, MD  Multiple Vitamin (MULTIVITAMIN) tablet Take 1 tablet by mouth daily.    [provider]  ondansetron (ZOFRAN) 4 MG tablet Take 1 tablet (4 mg total) by mouth every 8 (eight) hours as needed for nausea or vomiting. 11/09/20   Lucretia Kern, DO  pyridOXINE (VITAMIN B-6) 100 MG tablet Take 100 mg by mouth daily.    [provider]  rosuvastatin (CRESTOR) 20 MG tablet Take 1 tablet (20 mg total) by mouth daily. 08/10/20   Burchette, Alinda Sierras, MD  sertraline (ZOLOFT) 50 MG tablet TAKE 1 TABLET BY MOUTH  DAILY Patient taking differently: Take 50 mg by mouth daily. 08/18/20   Burchette, Alinda Sierras, MD  Zinc 220 (50 Zn) MG CAPS Take 220 mg by mouth daily.    [provider]  zolpidem (AMBIEN) 5  MG tablet Take 1 tablet (5 mg total) by mouth at bedtime as needed for sleep. 12/13/20   Burchette, Alinda Sierras, MD    Allergies    Azithromycin, Clarithromycin, Heparin, and Penicillins  Review of Systems   Review of Systems  Constitutional: Positive for fatigue. Negative for chills and fever.  HENT: Negative.   Respiratory: Positive for shortness of breath. Negative for cough.   Cardiovascular: Negative for chest pain.  Gastrointestinal: Positive for constipation. Negative for abdominal pain, blood in stool, nausea and vomiting.  Genitourinary: Positive for frequency. Negative for dysuria, flank  pain and hematuria.  Musculoskeletal: Negative for arthralgias and myalgias.  Skin: Negative for color change and rash.  Neurological: Positive for weakness (Generalized). Negative for dizziness, syncope and light-headedness.  All other systems reviewed and are negative.   Physical Exam Updated Vital Signs BP 134/72 (BP Location: Left Arm)   Pulse 75   Temp 97.7 F (36.5 C) (Oral)   Resp 18   SpO2 100%   Physical Exam Vitals and nursing note reviewed.  Constitutional:      General: She is not in acute distress.    Appearance: She is well-developed and well-nourished. She is not diaphoretic.     Comments: Elderly female, somewhat somnolent but easily awakens to verbal stimuli and is able to answer all questions, ill-appearing, but not in acute distress  HENT:     Head: Normocephalic and atraumatic.     Mouth/Throat:     Mouth: Oropharynx is clear and moist. Mucous membranes are moist.     Pharynx: Oropharynx is clear.  Eyes:     General:        Right eye: No discharge.        Left eye: No discharge.     Extraocular Movements: EOM normal.     Pupils: Pupils are equal, round, and reactive to light.  Cardiovascular:     Rate and Rhythm: Normal rate and regular rhythm.     Pulses: Normal pulses and intact distal pulses.     Heart sounds: Normal heart sounds. No murmur heard. No  gallop.   Pulmonary:     Effort: Pulmonary effort is normal. No respiratory distress.     Breath sounds: Normal breath sounds. No wheezing or rales.     Comments: Respirations equal and unlabored, satting well on room air, breath sounds slightly diminished bilaterally, but no wheezes, rales or rhonchi Abdominal:     General: Bowel sounds are normal. There is no distension.     Palpations: Abdomen is soft. There is no mass.     Tenderness: There is abdominal tenderness. There is no guarding.     Comments: Abdomen is soft and nondistended, bowel sounds are present throughout, patient does have generalized tenderness throughout the abdomen that does not localize to 1 quadrant, no guarding or peritoneal signs.  Genitourinary:    Comments: Chaperone present during rectal exam. Normal rectal tone, no hemorrhoids noted Soft brown stool present in rectal vault without melena or gross visible blood Musculoskeletal:        General: No deformity or edema.     Cervical back: Neck supple.  Skin:    General: Skin is warm and dry.     Capillary Refill: Capillary refill takes less than 2 seconds.     Findings: Rash present.  Neurological:     Mental Status: She is alert and oriented to person, place, and time.     Coordination: Coordination normal.     Comments: Speech is clear, able to follow commands CN III-XII intact Normal strength in upper and lower extremities bilaterally including dorsiflexion and plantar flexion, strong and equal grip strength Sensation normal to light and sharp touch Moves extremities without ataxia, coordination intact   Psychiatric:        Mood and Affect: Mood normal.        Behavior: Behavior normal.     ED Results / Procedures / Treatments   Labs (all labs ordered are listed, but only abnormal results are displayed) Labs Reviewed  COMPREHENSIVE METABOLIC PANEL - Abnormal; Notable  for the following components:      Result Value   Glucose, Bld 144 (*)    BUN 72  (*)    Creatinine, Ser 5.16 (*)    Calcium 7.9 (*)    Total Protein 5.0 (*)    Albumin 2.4 (*)    GFR, Estimated 8 (*)    All other components within normal limits  CBC WITH DIFFERENTIAL/PLATELET - Abnormal; Notable for the following components:   RBC 2.47 (*)    Hemoglobin 7.5 (*)    HCT 23.8 (*)    All other components within normal limits  BRAIN NATRIURETIC PEPTIDE - Abnormal; Notable for the following components:   B Natriuretic Peptide 1,827.3 (*)    All other components within normal limits  IRON AND TIBC - Abnormal; Notable for the following components:   TIBC 239 (*)    All other components within normal limits  RETICULOCYTES - Abnormal; Notable for the following components:   RBC. 2.66 (*)    All other components within normal limits  POC OCCULT BLOOD, ED - Abnormal; Notable for the following components:   Fecal Occult Bld POSITIVE (*)    All other components within normal limits  TROPONIN I (HIGH SENSITIVITY) - Abnormal; Notable for the following components:   Troponin I (High Sensitivity) 410 (*)    All other components within normal limits  TROPONIN I (HIGH SENSITIVITY) - Abnormal; Notable for the following components:   Troponin I (High Sensitivity) 395 (*)    All other components within normal limits  URINE CULTURE  MAGNESIUM  LACTIC ACID, PLASMA  LACTIC ACID, PLASMA  VITAMIN B12  FOLATE  FERRITIN  URINALYSIS, ROUTINE W REFLEX MICROSCOPIC  COMPREHENSIVE METABOLIC PANEL  CBC  CBG MONITORING, ED  PREPARE RBC (CROSSMATCH)  TYPE AND SCREEN    EKG EKG Interpretation  Date/Time:  Wednesday December 15 2020 18:03:51 EST Ventricular Rate:  78 PR Interval:  154 QRS Duration: 154 QT Interval:  478 QTC Calculation: 544 R Axis:   112 Text Interpretation: Normal sinus rhythm Right axis deviation Left ventricular hypertrophy with QRS widening and repolarization abnormality ( Sokolow-Lyon , Cornell product ) Abnormal ECG No significant change since last tracing  Confirmed by Calvert Cantor [phone]) on 12/14/2020 6:09:35 PM   Radiology CT Abdomen Pelvis Wo Contrast  Result Date: 12/24/2020 CLINICAL DATA:  Acute nonlocalized abdominal pain. EXAM: CT ABDOMEN AND PELVIS WITHOUT CONTRAST TECHNIQUE: Multidetector CT imaging of the abdomen and pelvis was performed following the standard protocol without IV contrast. COMPARISON:  07/07/2011 FINDINGS: Lower chest: Bilateral effusions layering dependently. Cardiomegaly. Abnormal patchy pulmonary markings that could be a combination of chronic and acute lung disease, including the possibility acute edema and pneumonia. Hepatobiliary: Previous cholecystectomy. No focal liver parenchymal lesion. Pancreas: Normal Spleen: Normal Adrenals/Urinary Tract: Adrenal glands are normal. Kidneys are small. Small low-density areas in both kidneys, indeterminate by CT. Cysts shown by recent ultrasound. Stomach/Bowel: Stomach and small intestine are normal. No acute colon pathology. Fecal impaction in the rectum. Vascular/Lymphatic: Aortic atherosclerotic calcification. No aneurysm. IVC is normal. No adenopathy. Reproductive: No pelvic mass. Other: No free fluid or air. Musculoskeletal: No evidence of hernia. Chronic degenerative changes throughout the lumbar spine. Old minor superior endplate fracture at E08. IMPRESSION: 1. Fecal impaction in the rectum which could be symptomatic. 2. Bilateral pleural effusions layering dependently. Abnormal patchy pulmonary markings that could be a combination of chronic and acute lung disease, including the possibility of acute edema and patchy pneumonia. 3. Aortic atherosclerosis. 4.  Small kidneys. Aortic Atherosclerosis (ICD10-I70.0). Electronically Signed   By: Nelson Chimes M.D.   On: 12/22/2020 20:48   DG Chest Portable 1 View  Result Date: 12/23/2020 CLINICAL DATA:  80 year old female with weakness. EXAM: PORTABLE CHEST 1 VIEW COMPARISON:  Chest radiograph dated 04/14/2021. FINDINGS: Shallow  inspiration with bibasilar atelectasis. The cardiomegaly with vascular congestion. No pleural effusion or pneumothorax. Faint right peripheral/subpleural haziness may represent atelectasis or scarring. Atypical infiltrate is less likely but not excluded clinical correlation recommended. Atherosclerotic calcification of the aorta. No acute osseous pathology. IMPRESSION: Cardiomegaly with vascular congestion.  No focal consolidation. Electronically Signed   By: Anner Crete M.D.   On: 12/07/2020 20:12    Procedures .Critical Care Performed by: Jacqlyn Larsen, PA-C Authorized by: Jacqlyn Larsen, PA-C   Critical care provider statement:    Critical care time (minutes):  45   Critical care time was exclusive of:  Separately billable procedures and treating other patients   Critical care was necessary to treat or prevent imminent or life-threatening deterioration of the following conditions:  Circulatory failure (Symptomatic anemia requiring transfusion)   Critical care was time spent personally by me on the following activities:  Discussions with consultants, evaluation of patient's response to treatment, examination of patient, ordering and performing treatments and interventions, ordering and review of laboratory studies, ordering and review of radiographic studies, pulse oximetry, re-evaluation of patient's condition, obtaining history from patient or surrogate and review of old charts     Medications Ordered in ED Medications  carvedilol (COREG) tablet 6.25 mg (has no administration in time range)  isosorbide mononitrate (IMDUR) 24 hr tablet 30 mg (has no administration in time range)  rosuvastatin (CRESTOR) tablet 20 mg (has no administration in time range)  sertraline (ZOLOFT) tablet 50 mg (has no administration in time range)  zolpidem (AMBIEN) tablet 5 mg (has no administration in time range)  levothyroxine (SYNTHROID) tablet 25 mcg (has no administration in time range)  albuterol  (VENTOLIN HFA) 108 (90 Base) MCG/ACT inhaler 2 puff (has no administration in time range)  insulin aspart (novoLOG) injection 0-6 Units (0 Units Subcutaneous Not Given 12/10/2020 2315)  acetaminophen (TYLENOL) tablet 650 mg (has no administration in time range)    Or  acetaminophen (TYLENOL) suppository 650 mg (has no administration in time range)  pantoprazole (PROTONIX) injection 40 mg (has no administration in time range)  0.9 %  sodium chloride infusion (10 mL/hr Intravenous New Bag/Given 12/09/2020 2203)    ED Course  I have reviewed the triage vital signs and the nursing notes.  Pertinent labs & imaging results that were available during my care of the patient were reviewed by me and considered in my medical decision making (see chart for details).    MDM Rules/Calculators/A&P                         80 y.o. female presents to the ED with complaints of generalized weakness and altered mental status, this involves an extensive number of treatment options, and is a complaint that carries with it a high risk of complications and morbidity.  The differential diagnosis includes infection, CHF, anemia, UTI, stroke, deconditioning, ACS, dehydration, worsening renal failure.  On arrival pt somewhat somnolent but easily awakens to voice and is able to answer all my questions, is ill-appearing but not in acute distress, vitals normal on arrival. Exam significant for generalized abdominal tenderness, patient appears pale, she does not have focal neurologic deficits  but appears generally weak and fatigued  Additional history obtained from EMS. Previous records obtained and reviewed via EMR  Lab Tests:  I Ordered, reviewed, and interpreted labs, which included:  CBC: No leukocytosis, but patient with hemoglobin of 7.5, was recently 9.1, concern acute drop could be leading to symptomatic anemia, given patient's cardiac history meets criteria for transfusion CMP: Patient with worsening renal function, was  3.33 a month ago, now is 5.16 with elevated BUN, normal potassium no other significant electrolyte derangements, normal renal function Lactic: Normal Troponin: Elevated at 14, but this is significantly improved from troponin in the thousands during recent admission, suspect this is downtrending BNP: Elevated at 1827.3, likely in the setting of myocarditis and decreased EF Hemoccult: Positive  Will get anemia panel, type and screen and order transfusion  Imaging Studies ordered:  I ordered imaging studies which included chest x-ray and CT abdomen pelvis, I independently visualized and interpreted imaging which showed cardiomegaly with mild signs of vascular congestion noted on chest x-ray.  CT abdomen pelvis with signs of fecal impaction, although this was too high for manual disimpaction on rectal exam, patient with bilateral pleural effusions, no other acute findings noted in the abdomen or pelvis.  ED Course:   Critical interventions: Blood transfusion for symptomatic anemia with GI bleeding  Sent message to Dr. Fuller Plan with low [facility] for nonemergent consult for GI bleed in the morning  I consulted Dr. Hal Hope with [facility] and discussed lab and imaging findings, he will see and admit the patient    Portions of this note were generated with Dragon dictation software. Dictation errors may occur despite best attempts at proofreading.  Final Clinical Impression(s) / ED Diagnoses Final diagnoses:  Symptomatic anemia  Acute GI bleeding    Rx / DC Orders ED Discharge Orders    None       Janet Berlin 12/18/20 Jorge Ny, MD 12/20/20 [phone]

## 2020-12-15 NOTE — ED Triage Notes (Signed)
Pt here from home with c/o weakness and slightly aloc more than usual , no fever, pt states that she has been urinating more than usual

## 2020-12-15 NOTE — H&P (Addendum)
History and Physical    [name] JOA:[id number] DOB: 1941/08/11 DOA: 12/16/2020  PCP: Brenda Post, MD  Patient coming from: Home.  Chief Complaint: Weakness.  HPI: Brenda Patterson is a 80 y.o. female with history of hypertension, diabetes mellitus type 2, hypothyroidism admitted last month for respiratory failure secondary to COVID-19 infection at the time patient was diagnosed with systolic heart failure likely from myopericarditis not felt to be a candidate for any cardiac cath due to worsening renal function has been feeling weak over the last few days.  Patient states after she got discharged on November 20, 2020 she did well but over the last few days has gotten more weak difficulty ambulating.  Denies any chest pain productive cough shortness of breath fever chills denies any nausea vomiting abdominal pain but has been having some constipation.  Has not noticed any obvious bleeding from the rectum or any black stools.  Denies taking any NSAIDs.  ED Course: In the ER chest x-ray shows features concerning for congestion.  Labs show hemoglobin of 7.5 which is a drop from 9.1 last month and stool for occult blood is positive.  Creatinine is further worsened from 3.3 last month is around 5.1.  CT abdomen pelvis shows fecal impaction and also bilateral pleural effusion with infiltrative changes.  Patient admitted for acute GI bleed with symptomatic anemia with worsening renal function and also pleural effusion but not hypoxic.  1 unit of PRBC transfusion has been ordered and on-call GI was notified.  Review of Systems: As per HPI, rest all negative.   Past Medical History:  Diagnosis Date  . Allergy   . Arthritis   . 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 Diabetes mellitus   . Hyperlipidemia   . Hypertension   . LBBB (left bundle branch block) 11/15/2020  . Migraines     Past Surgical History:  Procedure Laterality Date  . CHOLECYSTECTOMY    .  TUBAL LIGATION       reports that she has never smoked. She has never used smokeless tobacco. She reports that she does not drink alcohol and does not use drugs.  Allergies  Allergen Reactions  . Azithromycin Nausea Only  . Clarithromycin Other (See Comments)  . Heparin     Patient states it makes her weak  . Penicillins Rash    Rash noted inside her mouth and lips    Family History  Problem Relation Age of Onset  . Arthritis Other        fhx  . Hyperlipidemia Other        fhx  . Hypertension Other        fhx  . Diabetes Other        fhx  . Stroke Other        fhx  . Breast cancer Sister     Prior to Admission medications   Medication Sig Start Date End Date Taking? Authorizing Provider  albuterol (VENTOLIN HFA) 108 (90 Base) MCG/ACT inhaler Inhale 2 puffs into the lungs every 6 (six) hours as needed for wheezing or shortness of breath. 08/10/20 09/09/20  Patterson, Brenda Sierras, MD  Ascorbic Acid (VITAMIN C) 1000 MG tablet Take 1,000 mg by mouth daily.    [provider]  aspirin EC 81 MG tablet Take 81 mg by mouth daily.    [provider]  Calcium Carbonate (CALCIUM 600 PO) Take 600 mg by mouth 2 (two)  times daily.     [provider]  carvedilol (COREG) 6.25 MG tablet TAKE 1 TABLET BY MOUTH  TWICE DAILY Patient taking differently: Take 6.25 mg by mouth 2 (two) times daily with a meal. 06/23/20   Patterson, Brenda Sierras, MD  Cholecalciferol (VITAMIN D) 50 MCG (2000 UT) tablet Take 2,000 Units by mouth daily.    [provider]  fluticasone (FLONASE) 50 MCG/ACT nasal spray Place 2 sprays into both nostrils daily. Patient taking differently: Place 2 sprays into both nostrils daily as needed for allergies. 11/05/17   Brenda Chapel, MD  glipiZIDE (GLUCOTROL XL) 2.5 MG 24 hr tablet TAKE 1 TABLET BY MOUTH  DAILY WITH BREAKFAST Patient taking differently: Take 2.5 mg by mouth daily with breakfast. 06/23/20   Patterson, Brenda Sierras, MD  isosorbide mononitrate  (IMDUR) 30 MG 24 hr tablet Take 1 tablet (30 mg total) by mouth daily. 11/20/20   Brenda Ishikawa, MD  levothyroxine (SYNTHROID) 25 MCG tablet TAKE 1 TABLET BY MOUTH ONCE DAILY WITH BREAKFAST Patient taking differently: Take 25 mcg by mouth daily before breakfast. 08/18/20   Patterson, Brenda Sierras, MD  loperamide (IMODIUM A-D) 2 MG tablet Take 1 tablet (2 mg total) by mouth 4 (four) times daily as needed for diarrhea or loose stools. 11/12/20   Brenda Sorrow, MD  Multiple Vitamin (MULTIVITAMIN) tablet Take 1 tablet by mouth daily.    [provider]  ondansetron (ZOFRAN) 4 MG tablet Take 1 tablet (4 mg total) by mouth every 8 (eight) hours as needed for nausea or vomiting. 11/09/20   Brenda Kern, DO  pyridOXINE (VITAMIN B-6) 100 MG tablet Take 100 mg by mouth daily.    [provider]  rosuvastatin (CRESTOR) 20 MG tablet Take 1 tablet (20 mg total) by mouth daily. 08/10/20   Patterson, Brenda Sierras, MD  sertraline (ZOLOFT) 50 MG tablet TAKE 1 TABLET BY MOUTH  DAILY Patient taking differently: Take 50 mg by mouth daily. 08/18/20   Patterson, Brenda Sierras, MD  Zinc 220 (50 Zn) MG CAPS Take 220 mg by mouth daily.    [provider]  zolpidem (AMBIEN) 5 MG tablet Take 1 tablet (5 mg total) by mouth at bedtime as needed for sleep. 12/13/20   Patterson, Brenda Sierras, MD    Physical Exam: Constitutional: Moderately built and nourished. Vitals:   12/16/2020 2045 12/18/2020 2135 12/11/2020 2152 12/11/2020 2207  BP: (!) 126/53 113/63  (!) 116/57  Pulse: 71 78  75  Resp: 16 16  17   Temp:  (!) 79.9 F (26.6 C) 99.9 F (37.7 C) 97.7 F (36.5 C)  TempSrc:  Oral Oral Temporal  SpO2: 99% 98%  98%   Eyes: Anicteric no pallor. ENMT: No discharge from the ears eyes nose or mouth. Neck: No mass felt.  No neck rigidity. Respiratory: No rhonchi or crepitations. Cardiovascular: S1-S2 heard. Abdomen: Soft nontender bowel sounds present. Musculoskeletal: No edema. Skin: No rash. Neurologic: Alert awake  oriented to time place and person.  Moves all extremities. Psychiatric: Appears normal.  Normal affect.   Labs on Admission: I have personally reviewed following labs and imaging studies  CBC: Recent Labs  Lab 12/18/2020 1750  WBC 5.5  NEUTROABS 3.7  HGB 7.5*  HCT 23.8*  MCV 96.4  PLT 614   Basic Metabolic Panel: Recent Labs  Lab 12/22/2020 1750  NA 136  K 4.2  CL 100  CO2 23  GLUCOSE 144*  BUN 72*  CREATININE 5.16*  CALCIUM 7.9*  MG 2.2   GFR: CrCl cannot be calculated (Unknown ideal weight.). Liver Function Tests: Recent Labs  Lab 12/13/2020 1750  AST 24  ALT 23  ALKPHOS 45  BILITOT 0.7  PROT 5.0*  ALBUMIN 2.4*   No results for input(s): LIPASE, AMYLASE in the last 168 hours. No results for input(s): AMMONIA in the last 168 hours. Coagulation Profile: No results for input(s): INR, PROTIME in the last 168 hours. Cardiac Enzymes: No results for input(s): CKTOTAL, CKMB, CKMBINDEX, TROPONINI in the last 168 hours. BNP (last 3 results) No results for input(s): PROBNP in the last 8760 hours. HbA1C: No results for input(s): HGBA1C in the last 72 hours. CBG: No results for input(s): GLUCAP in the last 168 hours. Lipid Profile: No results for input(s): CHOL, HDL, LDLCALC, TRIG, CHOLHDL, LDLDIRECT in the last 72 hours. Thyroid Function Tests: No results for input(s): TSH, T4TOTAL, FREET4, T3FREE, THYROIDAB in the last 72 hours. Anemia Panel: Recent Labs    12/23/2020 2003 12/30/2020 2026  VITAMINB12  --  186  FOLATE 14.1  --   FERRITIN  --  176  TIBC  --  239*  IRON  --  39  RETICCTPCT 2.2  --    Urine analysis:    Component Value Date/Time   COLORURINE YELLOW 11/14/2020 2216   APPEARANCEUR HAZY (A) 11/14/2020 2216   LABSPEC 1.008 11/14/2020 2216   PHURINE 6.0 11/14/2020 2216   GLUCOSEU NEGATIVE 11/14/2020 2216   HGBUR MODERATE (A) 11/14/2020 2216   BILIRUBINUR NEGATIVE 11/14/2020 2216   BILIRUBINUR neg 02/15/2012 1523   KETONESUR NEGATIVE 11/14/2020  2216   PROTEINUR 100 (A) 11/14/2020 2216   UROBILINOGEN 0.2 11/21/2012 1035   NITRITE NEGATIVE 11/14/2020 2216   LEUKOCYTESUR TRACE (A) 11/14/2020 2216   Sepsis Labs: @LABRCNTIP (procalcitonin:4,lacticidven:4) )No results found for this or any previous visit (from the past 240 hour(s)).   Radiological Exams on Admission: CT Abdomen Pelvis Wo Contrast  Result Date: 12/29/2020 CLINICAL DATA:  Acute nonlocalized abdominal pain. EXAM: CT ABDOMEN AND PELVIS WITHOUT CONTRAST TECHNIQUE: Multidetector CT imaging of the abdomen and pelvis was performed following the standard protocol without IV contrast. COMPARISON:  07/07/2011 FINDINGS: Lower chest: Bilateral effusions layering dependently. Cardiomegaly. Abnormal patchy pulmonary markings that could be a combination of chronic and acute lung disease, including the possibility acute edema and pneumonia. Hepatobiliary: Previous cholecystectomy. No focal liver parenchymal lesion. Pancreas: Normal Spleen: Normal Adrenals/Urinary Tract: Adrenal glands are normal. Kidneys are small. Small low-density areas in both kidneys, indeterminate by CT. Cysts shown by recent ultrasound. Stomach/Bowel: Stomach and small intestine are normal. No acute colon pathology. Fecal impaction in the rectum. Vascular/Lymphatic: Aortic atherosclerotic calcification. No aneurysm. IVC is normal. No adenopathy. Reproductive: No pelvic mass. Other: No free fluid or air. Musculoskeletal: No evidence of hernia. Chronic degenerative changes throughout the lumbar spine. Old minor superior endplate fracture at Z61. IMPRESSION: 1. Fecal impaction in the rectum which could be symptomatic. 2. Bilateral pleural effusions layering dependently. Abnormal patchy pulmonary markings that could be a combination of chronic and acute lung disease, including the possibility of acute edema and patchy pneumonia. 3. Aortic atherosclerosis. 4. Small kidneys. Aortic Atherosclerosis (ICD10-I70.0). Electronically Signed    By: Nelson Chimes M.D.   On: 12/18/2020 20:48   DG Chest Portable 1 View  Result Date: 12/16/2020 CLINICAL DATA:  80 year old female with weakness. EXAM: PORTABLE CHEST 1 VIEW COMPARISON:  Chest radiograph dated 04/14/2021. FINDINGS: Shallow inspiration with bibasilar atelectasis. The cardiomegaly with vascular congestion. No pleural effusion or pneumothorax.  Faint right peripheral/subpleural haziness may represent atelectasis or scarring. Atypical infiltrate is less likely but not excluded clinical correlation recommended. Atherosclerotic calcification of the aorta. No acute osseous pathology. IMPRESSION: Cardiomegaly with vascular congestion.  No focal consolidation. Electronically Signed   By: Anner Crete M.D.   On: 12/30/2020 20:12    EKG: Independently reviewed.  Normal sinus rhythm IVCD.  Assessment/Plan Principal Problem:   Acute GI bleeding Active Problems:   Type 2 diabetes mellitus, controlled ([place])   Essential hypertension   ARF (acute renal failure) ([facility])   Symptomatic anemia    1. Acute GI bleeding with symptomatic anemia presently receiving 1 unit of PRBC transfusion.  On Protonix.  We will keep patient on clear liquid consult Dr. Collene Mares patient's gastroenterologist.  Follow CBC after transfusion. 2. Acute blood loss anemia follow CBC after transfusion. 3. Denies weakness likely from symptomatic anemia.  Receiving 1 unit of PRBC. 4. Acute on chronic kidney disease stage III -follow creatinine after blood transfusion.  If any further worsening will need nephrology input. 5. Cardiomyopathy with last EF measured last month was 20 to 25% likely in the setting of Covid infection with possible myopericarditis.  CT scan does show bilateral pleural effusion patient is not hypoxic at this time.  We will continue with Imdur and carvedilol.  No indication for any diuretic at this time.  Closely follow respiratory status. 6. Hypothyroidism on Synthroid. 7. Hypertension on Imdur and  carvedilol. 8. Diabetes mellitus type 2 we will keep patient on sliding scale coverage. 9. Recent admission for Covid infection last month. 10. Fecal impaction -will order enema while on the floor.  Will await GI input.  Given that patient has GI bleed with symptomatic anemia and worsening renal function will need close monitoring for any further worsening in inpatient status.   DVT prophylaxis: SCDs.  Avoiding anticoagulation in setting of GI bleed. Code Status: Full code. Family Communication: Discussed with patient. Disposition Plan: Home when stable. Consults called: Dr. Collene Mares gastroenterologist. Admission status: Inpatient.   Rise Patience MD [facility] Pager [phone].  If 7PM-7AM, please contact night-coverage [email or web address] Password [facility]  12/14/2020, 10:53 PM

## 2020-12-16 DIAGNOSIS — I10 Essential (primary) hypertension: Secondary | ICD-10-CM

## 2020-12-16 DIAGNOSIS — E1122 Type 2 diabetes mellitus with diabetic chronic kidney disease: Secondary | ICD-10-CM

## 2020-12-16 DIAGNOSIS — N183 Chronic kidney disease, stage 3 unspecified: Secondary | ICD-10-CM

## 2020-12-16 DIAGNOSIS — K922 Gastrointestinal hemorrhage, unspecified: Secondary | ICD-10-CM | POA: Diagnosis not present

## 2020-12-16 DIAGNOSIS — D649 Anemia, unspecified: Secondary | ICD-10-CM | POA: Diagnosis not present

## 2020-12-16 DIAGNOSIS — N179 Acute kidney failure, unspecified: Secondary | ICD-10-CM | POA: Diagnosis not present

## 2020-12-16 LAB — URINALYSIS, ROUTINE W REFLEX MICROSCOPIC
Bilirubin Urine: NEGATIVE
Glucose, UA: NEGATIVE mg/dL
Ketones, ur: NEGATIVE mg/dL
Nitrite: NEGATIVE
Protein, ur: 30 mg/dL — AB
Specific Gravity, Urine: 1.02 (ref 1.005–1.030)
pH: 6 (ref 5.0–8.0)

## 2020-12-16 LAB — COMPREHENSIVE METABOLIC PANEL
ALT: 22 U/L (ref 0–44)
AST: 23 U/L (ref 15–41)
Albumin: 2.3 g/dL — ABNORMAL LOW (ref 3.5–5.0)
Alkaline Phosphatase: 44 U/L (ref 38–126)
Anion gap: 13 (ref 5–15)
BUN: 71 mg/dL — ABNORMAL HIGH (ref 8–23)
CO2: 24 mmol/L (ref 22–32)
Calcium: 8 mg/dL — ABNORMAL LOW (ref 8.9–10.3)
Chloride: 101 mmol/L (ref 98–111)
Creatinine, Ser: 4.99 mg/dL — ABNORMAL HIGH (ref 0.44–1.00)
GFR, Estimated: 8 mL/min — ABNORMAL LOW (ref >60)
Glucose, Bld: 112 mg/dL — ABNORMAL HIGH (ref 70–99)
Potassium: 4.2 mmol/L (ref 3.5–5.1)
Sodium: 138 mmol/L (ref 135–145)
Total Bilirubin: 0.8 mg/dL (ref 0.3–1.2)
Total Protein: 4.9 g/dL — ABNORMAL LOW (ref 6.5–8.1)

## 2020-12-16 LAB — BPAM RBC
Blood Product Expiration Date: 202203112359
ISSUE DATE / TIME: 202202092145
Unit Type and Rh: 5100

## 2020-12-16 LAB — CBC
HCT: 26.9 % — ABNORMAL LOW (ref 36.0–46.0)
Hemoglobin: 8.8 g/dL — ABNORMAL LOW (ref 12.0–15.0)
MCH: 30.4 pg (ref 26.0–34.0)
MCHC: 32.7 g/dL (ref 30.0–36.0)
MCV: 93.1 fL (ref 80.0–100.0)
Platelets: 216 10*3/uL (ref 150–400)
RBC: 2.89 MIL/uL — ABNORMAL LOW (ref 3.87–5.11)
RDW: 14.5 % (ref 11.5–15.5)
WBC: 5.7 10*3/uL (ref 4.0–10.5)
nRBC: 0 % (ref 0.0–0.2)

## 2020-12-16 LAB — URINALYSIS, MICROSCOPIC (REFLEX): Bacteria, UA: NONE SEEN

## 2020-12-16 LAB — TYPE AND SCREEN
ABO/RH(D): O POS
Antibody Screen: NEGATIVE
Unit division: 0

## 2020-12-16 LAB — CBG MONITORING, ED
Glucose-Capillary: 68 mg/dL — ABNORMAL LOW (ref 70–99)
Glucose-Capillary: 122 mg/dL — ABNORMAL HIGH (ref 70–99)
Glucose-Capillary: 145 mg/dL — ABNORMAL HIGH (ref 70–99)
Glucose-Capillary: 122 mg/dL — ABNORMAL HIGH (ref 70–99)
Glucose-Capillary: 132 mg/dL — ABNORMAL HIGH (ref 70–99)
Glucose-Capillary: 148 mg/dL — ABNORMAL HIGH (ref 70–99)
Glucose-Capillary: 131 mg/dL — ABNORMAL HIGH (ref 70–99)

## 2020-12-16 MED ORDER — HYDROXYZINE HCL 10 MG PO TABS
10.0000 mg | ORAL_TABLET | Freq: Three times a day (TID) | ORAL | Status: DC | PRN
  Filled 2020-12-16: qty 1

## 2020-12-16 MED ORDER — LORAZEPAM 2 MG/ML IJ SOLN
0.5000 mg | Freq: Four times a day (QID) | INTRAMUSCULAR | Status: AC | PRN
  Administered 2020-12-16 – 2020-12-17 (×2): 0.5 mg via INTRAVENOUS
  Filled 2020-12-16 (×2): qty 1

## 2020-12-16 MED ORDER — LORAZEPAM 0.5 MG PO TABS
0.5000 mg | ORAL_TABLET | Freq: Two times a day (BID) | ORAL | Status: DC | PRN
  Administered 2020-12-16 – 2020-12-18 (×4): 0.5 mg via ORAL
  Filled 2020-12-16 (×4): qty 1

## 2020-12-16 MED ORDER — ONDANSETRON HCL 4 MG/2ML IJ SOLN
4.0000 mg | Freq: Once | INTRAMUSCULAR | Status: AC
  Administered 2020-12-16: 4 mg via INTRAVENOUS
  Filled 2020-12-16: qty 2

## 2020-12-16 NOTE — ED Notes (Signed)
  Patient had small pasty bowel movement in diaper.  Patient cleaned, and repositioned in bed.

## 2020-12-16 NOTE — Consult Note (Signed)
Reason for Consult: Anemia, heme positive stool, and fecal impaction Referring Physician: [facility]  Joaquim Lai Cristopher Estimable HPI: This is a 80 year old female admitted for complaints of weakness.  She was recently discharged from the hospital with a COVID-19 pneumonia and CHF.  A myopericarditis was felt to be the source of her CHF.  She was medically managed, but at home she reported feeling progressively weaker.  Ambulation was difficult for her and she represented to the hospital.  In the ER her HGB was noted to have dropped down to 7.5 g/dL from her discharge HGB of 9.1 g/dL.  She was noted to be heme positive, but she denies any issues with hematochezia, melena, or hematemesis.  There is a history of CKD and her admission creatinine was at 5.16.  The baseline appears to be in the 3 range.  Other notable abnormalities are her elevated BNP at 1827 and elevated troponin at 395 and 410.  From the GI aspect she had her last colonoscopy with Dr. Collene Mares 10 years ago and it was a normal examination.  More recently she felt constipated and the CT of the abdomen was positive for a fecal impaction.  Past Medical History:  Diagnosis Date  . Allergy   . Arthritis   . 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 Diabetes mellitus   . Hyperlipidemia   . Hypertension   . LBBB (left bundle branch block) 11/15/2020  . Migraines     Past Surgical History:  Procedure Laterality Date  . CHOLECYSTECTOMY    . TUBAL LIGATION      Family History  Problem Relation Age of Onset  . Arthritis Other        fhx  . Hyperlipidemia Other        fhx  . Hypertension Other        fhx  . Diabetes Other        fhx  . Stroke Other        fhx  . Breast cancer Sister     Social History:  reports that she has never smoked. She has never used smokeless tobacco. She reports that she does not drink alcohol and does not use drugs.  Allergies:  Allergies  Allergen Reactions  . Azithromycin Nausea  Only  . Clarithromycin Other (See Comments)  . Heparin     Patient states it makes her weak  . Penicillins Rash    Rash noted inside her mouth and lips    Medications:  Scheduled: . carvedilol  6.25 mg Oral BID WC  . insulin aspart  0-6 Units Subcutaneous Q4H  . isosorbide mononitrate  30 mg Oral Daily  . levothyroxine  25 mcg Oral QAC breakfast  . pantoprazole (PROTONIX) IV  40 mg Intravenous Q12H  . rosuvastatin  20 mg Oral Daily  . sertraline  50 mg Oral Daily   Continuous:   Results for orders placed or performed during the hospital encounter of 12/20/2020 (from the past 24 hour(s))  Comprehensive metabolic panel     Status: Abnormal   Collection Time: 12/21/2020  5:50 PM  Result Value Ref Range   Sodium 136 135 - 145 mmol/L   Potassium 4.2 3.5 - 5.1 mmol/L   Chloride 100 98 - 111 mmol/L   CO2 23 22 - 32 mmol/L   Glucose, Bld 144 (H) 70 - 99 mg/dL   BUN 72 (H) 8 - 23 mg/dL   Creatinine, Ser 5.16 (  H) 0.44 - 1.00 mg/dL   Calcium 7.9 (L) 8.9 - 10.3 mg/dL   Total Protein 5.0 (L) 6.5 - 8.1 g/dL   Albumin 2.4 (L) 3.5 - 5.0 g/dL   AST 24 15 - 41 U/L   ALT 23 0 - 44 U/L   Alkaline Phosphatase 45 38 - 126 U/L   Total Bilirubin 0.7 0.3 - 1.2 mg/dL   GFR, Estimated 8 (L) >60 mL/min   Anion gap 13 5 - 15  CBC with Differential     Status: Abnormal   Collection Time: 12/20/2020  5:50 PM  Result Value Ref Range   WBC 5.5 4.0 - 10.5 K/uL   RBC 2.47 (L) 3.87 - 5.11 MIL/uL   Hemoglobin 7.5 (L) 12.0 - 15.0 g/dL   HCT 23.8 (L) 36.0 - 46.0 %   MCV 96.4 80.0 - 100.0 fL   MCH 30.4 26.0 - 34.0 pg   MCHC 31.5 30.0 - 36.0 g/dL   RDW 13.6 11.5 - 15.5 %   Platelets 224 150 - 400 K/uL   nRBC 0.0 0.0 - 0.2 %   Neutrophils Relative % 68 %   Neutro Abs 3.7 1.7 - 7.7 K/uL   Lymphocytes Relative 19 %   Lymphs Abs 1.1 0.7 - 4.0 K/uL   Monocytes Relative 11 %   Monocytes Absolute 0.6 0.1 - 1.0 K/uL   Eosinophils Relative 2 %   Eosinophils Absolute 0.1 0.0 - 0.5 K/uL   Basophils Relative 0 %    Basophils Absolute 0.0 0.0 - 0.1 K/uL   Immature Granulocytes 0 %   Abs Immature Granulocytes 0.02 0.00 - 0.07 K/uL  Troponin I (High Sensitivity)     Status: Abnormal   Collection Time: 12/31/2020  5:50 PM  Result Value Ref Range   Troponin I (High Sensitivity) 410 (HH) <18 ng/L  Brain natriuretic peptide     Status: Abnormal   Collection Time: 12/21/2020  5:50 PM  Result Value Ref Range   B Natriuretic Peptide 1,827.3 (H) 0.0 - 100.0 pg/mL  Magnesium     Status: None   Collection Time: 12/31/2020  5:50 PM  Result Value Ref Range   Magnesium 2.2 1.7 - 2.4 mg/dL  Lactic acid, plasma     Status: None   Collection Time: 12/26/2020  6:32 PM  Result Value Ref Range   Lactic Acid, Venous 1.0 0.5 - 1.9 mmol/L  Troponin I (High Sensitivity)     Status: Abnormal   Collection Time: 12/08/2020  7:50 PM  Result Value Ref Range   Troponin I (High Sensitivity) 395 (HH) <18 ng/L  Lactic acid, plasma     Status: None   Collection Time: 12/09/2020  7:51 PM  Result Value Ref Range   Lactic Acid, Venous 1.4 0.5 - 1.9 mmol/L  Folate     Status: None   Collection Time: 12/07/2020  8:03 PM  Result Value Ref Range   Folate 14.1 >5.9 ng/mL  Reticulocytes     Status: Abnormal   Collection Time: 12/09/2020  8:03 PM  Result Value Ref Range   Retic Ct Pct 2.2 0.4 - 3.1 %   RBC. 2.66 (L) 3.87 - 5.11 MIL/uL   Retic Count, Absolute 58.8 19.0 - 186.0 K/uL   Immature Retic Fract 8.3 2.3 - 15.9 %  Prepare RBC (crossmatch)     Status: None   Collection Time: 12/14/2020  8:06 PM  Result Value Ref Range   Order Confirmation      ORDER PROCESSED  BY BLOOD BANK Performed at [facility], [place] [address], [place], [place] [address]   Type and screen     Status: None (Preliminary result)   Collection Time: 01/01/2021  8:14 PM  Result Value Ref Range   ABO/RH(D) O POS    Antibody Screen NEG    Sample Expiration 12/18/2020,2359    Unit Number [id number]    Blood Component Type RED CELLS,LR    Unit division 00     Status of Unit ISSUED    Transfusion Status OK TO TRANSFUSE    Crossmatch Result      Compatible Performed at [facility], [place] [address] [place], [place] [address]   POC occult blood, ED     Status: Abnormal   Collection Time: 12/28/2020  8:18 PM  Result Value Ref Range   Fecal Occult Bld POSITIVE (A) NEGATIVE  Vitamin B12     Status: None   Collection Time: 12/20/2020  8:26 PM  Result Value Ref Range   Vitamin B-12 186 180 - 914 pg/mL  Iron and TIBC     Status: Abnormal   Collection Time: 12/17/2020  8:26 PM  Result Value Ref Range   Iron 39 28 - 170 ug/dL   TIBC 239 (L) 250 - 450 ug/dL   Saturation Ratios 16 10.4 - 31.8 %   UIBC 200 ug/dL  Ferritin     Status: None   Collection Time: 01/03/2021  8:26 PM  Result Value Ref Range   Ferritin 176 11 - 307 ng/mL  CBG monitoring, ED     Status: None   Collection Time: 12/21/2020 11:15 PM  Result Value Ref Range   Glucose-Capillary 95 70 - 99 mg/dL  CBG monitoring, ED     Status: Abnormal   Collection Time: 12/16/20  2:15 AM  Result Value Ref Range   Glucose-Capillary 68 (L) 70 - 99 mg/dL  CBG monitoring, ED     Status: Abnormal   Collection Time: 12/16/20  2:47 AM  Result Value Ref Range   Glucose-Capillary 122 (H) 70 - 99 mg/dL  CBG monitoring, ED     Status: Abnormal   Collection Time: 12/16/20  3:44 AM  Result Value Ref Range   Glucose-Capillary 145 (H) 70 - 99 mg/dL  Comprehensive metabolic panel     Status: Abnormal   Collection Time: 12/16/20  4:46 AM  Result Value Ref Range   Sodium 138 135 - 145 mmol/L   Potassium 4.2 3.5 - 5.1 mmol/L   Chloride 101 98 - 111 mmol/L   CO2 24 22 - 32 mmol/L   Glucose, Bld 112 (H) 70 - 99 mg/dL   BUN 71 (H) 8 - 23 mg/dL   Creatinine, Ser 4.99 (H) 0.44 - 1.00 mg/dL   Calcium 8.0 (L) 8.9 - 10.3 mg/dL   Total Protein 4.9 (L) 6.5 - 8.1 g/dL   Albumin 2.3 (L) 3.5 - 5.0 g/dL   AST 23 15 - 41 U/L   ALT 22 0 - 44 U/L   Alkaline Phosphatase 44 38 - 126 U/L   Total Bilirubin 0.8 0.3 -  1.2 mg/dL   GFR, Estimated 8 (L) >60 mL/min   Anion gap 13 5 - 15  CBC     Status: Abnormal   Collection Time: 12/16/20  4:46 AM  Result Value Ref Range   WBC 5.7 4.0 - 10.5 K/uL   RBC 2.89 (L) 3.87 - 5.11 MIL/uL   Hemoglobin 8.8 (L) 12.0 -  15.0 g/dL   HCT 26.9 (L) 36.0 - 46.0 %   MCV 93.1 80.0 - 100.0 fL   MCH 30.4 26.0 - 34.0 pg   MCHC 32.7 30.0 - 36.0 g/dL   RDW 14.5 11.5 - 15.5 %   Platelets 216 150 - 400 K/uL   nRBC 0.0 0.0 - 0.2 %  Urinalysis, Routine w reflex microscopic Urine, Clean Catch     Status: Abnormal   Collection Time: 12/16/20  5:07 AM  Result Value Ref Range   Color, Urine YELLOW YELLOW   APPearance CLEAR CLEAR   Specific Gravity, Urine 1.020 1.005 - 1.030   pH 6.0 5.0 - 8.0   Glucose, UA NEGATIVE NEGATIVE mg/dL   Hgb urine dipstick MODERATE (A) NEGATIVE   Bilirubin Urine NEGATIVE NEGATIVE   Ketones, ur NEGATIVE NEGATIVE mg/dL   Protein, ur 30 (A) NEGATIVE mg/dL   Nitrite NEGATIVE NEGATIVE   Leukocytes,Ua SMALL (A) NEGATIVE  Urinalysis, Microscopic (reflex)     Status: None   Collection Time: 12/16/20  5:07 AM  Result Value Ref Range   RBC / HPF 0-5 0 - 5 RBC/hpf   WBC, UA 6-10 0 - 5 WBC/hpf   Bacteria, UA NONE SEEN NONE SEEN   Squamous Epithelial / LPF 0-5 0 - 5     CT Abdomen Pelvis Wo Contrast  Result Date: 12/08/2020 CLINICAL DATA:  Acute nonlocalized abdominal pain. EXAM: CT ABDOMEN AND PELVIS WITHOUT CONTRAST TECHNIQUE: Multidetector CT imaging of the abdomen and pelvis was performed following the standard protocol without IV contrast. COMPARISON:  07/07/2011 FINDINGS: Lower chest: Bilateral effusions layering dependently. Cardiomegaly. Abnormal patchy pulmonary markings that could be a combination of chronic and acute lung disease, including the possibility acute edema and pneumonia. Hepatobiliary: Previous cholecystectomy. No focal liver parenchymal lesion. Pancreas: Normal Spleen: Normal Adrenals/Urinary Tract: Adrenal glands are normal. Kidneys are  small. Small low-density areas in both kidneys, indeterminate by CT. Cysts shown by recent ultrasound. Stomach/Bowel: Stomach and small intestine are normal. No acute colon pathology. Fecal impaction in the rectum. Vascular/Lymphatic: Aortic atherosclerotic calcification. No aneurysm. IVC is normal. No adenopathy. Reproductive: No pelvic mass. Other: No free fluid or air. Musculoskeletal: No evidence of hernia. Chronic degenerative changes throughout the lumbar spine. Old minor superior endplate fracture at Z61. IMPRESSION: 1. Fecal impaction in the rectum which could be symptomatic. 2. Bilateral pleural effusions layering dependently. Abnormal patchy pulmonary markings that could be a combination of chronic and acute lung disease, including the possibility of acute edema and patchy pneumonia. 3. Aortic atherosclerosis. 4. Small kidneys. Aortic Atherosclerosis (ICD10-I70.0). Electronically Signed   By: Nelson Chimes M.D.   On: 12/30/2020 20:48   DG Chest Portable 1 View  Result Date: 12/18/2020 CLINICAL DATA:  80 year old female with weakness. EXAM: PORTABLE CHEST 1 VIEW COMPARISON:  Chest radiograph dated 04/14/2021. FINDINGS: Shallow inspiration with bibasilar atelectasis. The cardiomegaly with vascular congestion. No pleural effusion or pneumothorax. Faint right peripheral/subpleural haziness may represent atelectasis or scarring. Atypical infiltrate is less likely but not excluded clinical correlation recommended. Atherosclerotic calcification of the aorta. No acute osseous pathology. IMPRESSION: Cardiomegaly with vascular congestion.  No focal consolidation. Electronically Signed   By: Anner Crete M.D.   On: 12/17/2020 20:12    ROS:  As stated above in the HPI otherwise negative.  Blood pressure (!) 143/71, pulse 78, temperature 98.4 F (36.9 C), temperature source Oral, resp. rate 18, SpO2 95 %.    PE: Gen: NAD, Alert and Oriented HEENT:  [place]/AT, EOMI Neck:  Supple, no LAD Lungs: CTA  Bilaterally CV: RRR without M/G/R ABD: Soft, NTND, +BS Ext: No C/C/E  Assessment/Plan: 1) Fecal impaction. 2) Anemia. 3) Heme positive stool. 4) CHF. 5) CKD.   The rectal examination was positive for a large fecal impaction.  The stool was normal in color and there was no obvious signs of bleeding.  Her baseline HGB is in the 9-10 range and she has a long history of CKD.  It is reasonable to consider endoscopic evaluation in the near future, but she is too ill to pursue these evaluations.  Her fecal impaction will greatly improve with a disimpaction and a normalized stool regimen.  Plan: 1) Disimpaction. 2) Trial of some enemas (No Fleets with her CKD), but disimpaction is still required. 3) Monitor HGB and transfuse as necessary.  Her HGB increased after the 1 units of PRBC.  Amela Handley D 12/16/2020, 9:12 AM

## 2020-12-16 NOTE — ED Notes (Signed)
CBG @ 0220, reading is 69.   Pt was given two OJ cups and graham crackers.

## 2020-12-16 NOTE — ED Notes (Signed)
Pt CBG reading: 122

## 2020-12-16 NOTE — Progress Notes (Signed)
PROGRESS NOTE    Brenda Patterson  WER:[id number] DOB: October 01, 1941 DOA: 12/17/2020 PCP: [name], MD    No chief complaint on file.   Brief Narrative:  80 year old lady prior history of hypertension, hypothyroidism, type 2 diabetes, recently diagnosed with COVID-19 infection with resultant myopericarditis, not a candidate for any cardiac catheterization due to worsening renal parameters, discharged on November 20, 2020 presents today for generalized weakness and constipation. On arrival to ED labs show hemoglobin of 7.5 which have dropped from 9.1 last month. Stool for occult blood is positive. Creatinine worsened from 3.3 on discharge last month to 5.1 on admission. CT of the abdomen pelvis shows fecal impaction and bilateral pleural effusions. Patient was admitted for evaluation of GI bleed, symptomatic anemia, AKI, and significant constipation. GI consulted  Assessment & Plan:   Principal Problem:   Acute GI bleeding Active Problems:   Type 2 diabetes mellitus, controlled (HCC)   Essential hypertension   ARF (acute renal failure) (HCC)   Symptomatic anemia   Acute GI bleed/symptomatic anemia Drop of hemoglobin from 9-7.5. 1 unit of PRBC ordered. GI consulted recommended fecal disimpaction and enemas. Transfuse to keep hemoglobin greater than 7. Anemia panel shows adequate ferritin and iron levels.  AKI Probably secondary to dehydration. No hydronephrosis on the CT abdomen pelvis. Creatinine has improved from 4.9 today. If creatinine does not improve we will request nephrology consultation in the morning.    Generalized weakness Probably secondary to symptomatic anemia. PT evaluation will be ordered .   Cardiomyopathy Post Covid infection, echo shows left ventricular ejection fraction of 20 to 25%, possible myopericarditis. Continue with the home medications at this time. Patient remains on room air with good oxygen saturations at this time.    Abnormal  lung findings with bilateral effusions.  Patient remains on room air with good oxygen saturations. Continue to follow respiratory status.    Hypothyroidism Continue with Synthroid.   Type 2 diabetes mellitus Continue sliding scale insulin at this time.   History of recent Covid infection She was diagnosed with Covid on January 10. She currently denies any respiratory symptoms at this time.   Fecal impaction Disimpaction and enema was ordered.   Hypertension Blood pressure parameters appear to be normal   DVT prophylaxis: SCD'S Code Status: (Full Code) Family Communication: none at bedside.  Disposition:   Status is: Inpatient  Remains inpatient appropriate because:Ongoing diagnostic testing needed not appropriate for outpatient work up and Unsafe d/c plan   Dispo: The patient is from: Home              Anticipated d/c is to: pending              Anticipated d/c date is: 2 days              Patient currently is not medically stable to d/c.   Difficult to place patient No       Level of care: Telemetry Medical Consultants:   Gastroenterology.    Procedures: none.    Antimicrobials: none.    Subjective: Reports being constipated.   Objective: Vitals:   12/16/20 1100 12/16/20 1115 12/16/20 1130 12/16/20 1430  BP: 113/80 (!) 121/52 132/78 (!) 149/74  Pulse: 91 87 86 85  Resp: 20 (!) 22 18 18   Temp:   98.3 F (36.8 C)   TempSrc:   Oral   SpO2: 98% 96% 95% 96%   No intake or output data in the 24 hours ending 12/16/20 1517  There were no vitals filed for this visit.  Examination:  General exam: Appears calm and comfortable  Respiratory system: Clear to auscultation. Respiratory effort normal. Cardiovascular system: S1 & S2 heard, RRR.Marland Kitchen No pedal edema. Gastrointestinal system: Abdomen is nondistended, soft and nontender.Normal bowel sounds heard. Central nervous system: Alert and oriented. No focal neurological deficits. Extremities: Symmetric  5 x 5 power. Skin: No rashes, lesions or ulcers Psychiatry: . Mood & affect appropriate.     Data Reviewed: I have personally reviewed following labs and imaging studies  CBC: Recent Labs  Lab 12/25/2020 1750 12/16/20 0446  WBC 5.5 5.7  NEUTROABS 3.7  --   HGB 7.5* 8.8*  HCT 23.8* 26.9*  MCV 96.4 93.1  PLT 224 528    Basic Metabolic Panel: Recent Labs  Lab 12/11/2020 1750 12/16/20 0446  NA 136 138  K 4.2 4.2  CL 100 101  CO2 23 24  GLUCOSE 144* 112*  BUN 72* 71*  CREATININE 5.16* 4.99*  CALCIUM 7.9* 8.0*  MG 2.2  --     GFR: CrCl cannot be calculated (Unknown ideal weight.).  Liver Function Tests: Recent Labs  Lab 12/25/2020 1750 12/16/20 0446  AST 24 23  ALT 23 22  ALKPHOS 45 44  BILITOT 0.7 0.8  PROT 5.0* 4.9*  ALBUMIN 2.4* 2.3*    CBG: Recent Labs  Lab 12/17/2020 2315 12/16/20 0215 12/16/20 0247 12/16/20 0344 12/16/20 1059  GLUCAP 95 68* 122* 145* 122*     No results found for this or any previous visit (from the past 240 hour(s)).       Radiology Studies: CT Abdomen Pelvis Wo Contrast  Result Date: 01/01/2021 CLINICAL DATA:  Acute nonlocalized abdominal pain. EXAM: CT ABDOMEN AND PELVIS WITHOUT CONTRAST TECHNIQUE: Multidetector CT imaging of the abdomen and pelvis was performed following the standard protocol without IV contrast. COMPARISON:  07/07/2011 FINDINGS: Lower chest: Bilateral effusions layering dependently. Cardiomegaly. Abnormal patchy pulmonary markings that could be a combination of chronic and acute lung disease, including the possibility acute edema and pneumonia. Hepatobiliary: Previous cholecystectomy. No focal liver parenchymal lesion. Pancreas: Normal Spleen: Normal Adrenals/Urinary Tract: Adrenal glands are normal. Kidneys are small. Small low-density areas in both kidneys, indeterminate by CT. Cysts shown by recent ultrasound. Stomach/Bowel: Stomach and small intestine are normal. No acute colon pathology. Fecal impaction in  the rectum. Vascular/Lymphatic: Aortic atherosclerotic calcification. No aneurysm. IVC is normal. No adenopathy. Reproductive: No pelvic mass. Other: No free fluid or air. Musculoskeletal: No evidence of hernia. Chronic degenerative changes throughout the lumbar spine. Old minor superior endplate fracture at U13. IMPRESSION: 1. Fecal impaction in the rectum which could be symptomatic. 2. Bilateral pleural effusions layering dependently. Abnormal patchy pulmonary markings that could be a combination of chronic and acute lung disease, including the possibility of acute edema and patchy pneumonia. 3. Aortic atherosclerosis. 4. Small kidneys. Aortic Atherosclerosis (ICD10-I70.0). Electronically Signed   By: Nelson Chimes M.D.   On: 12/10/2020 20:48   DG Chest Portable 1 View  Result Date: 12/13/2020 CLINICAL DATA:  80 year old female with weakness. EXAM: PORTABLE CHEST 1 VIEW COMPARISON:  Chest radiograph dated 04/14/2021. FINDINGS: Shallow inspiration with bibasilar atelectasis. The cardiomegaly with vascular congestion. No pleural effusion or pneumothorax. Faint right peripheral/subpleural haziness may represent atelectasis or scarring. Atypical infiltrate is less likely but not excluded clinical correlation recommended. Atherosclerotic calcification of the aorta. No acute osseous pathology. IMPRESSION: Cardiomegaly with vascular congestion.  No focal consolidation. Electronically Signed   By: Laren Everts.D.  On: 12/20/2020 20:12        Scheduled Meds: . carvedilol  6.25 mg Oral BID WC  . insulin aspart  0-6 Units Subcutaneous Q4H  . isosorbide mononitrate  30 mg Oral Daily  . levothyroxine  25 mcg Oral QAC breakfast  . pantoprazole (PROTONIX) IV  40 mg Intravenous Q12H  . rosuvastatin  20 mg Oral Daily  . sertraline  50 mg Oral Daily   Continuous Infusions:   LOS: 1 day        Hosie Poisson, MD [facility]   To contact the attending provider between 7A-7P or the covering  provider during after hours 7P-7A, please log into the web site [email or web address] and access using universal [place] password for that web site. If you do not have the password, please call the hospital operator.  12/16/2020, 3:17 PM

## 2020-12-16 NOTE — ED Notes (Signed)
Breakfast Ordered 

## 2020-12-16 NOTE — ED Notes (Signed)
Pt requested daughter to be updated and possibly come visit in the ED. Daughter called and updated.

## 2020-12-17 DIAGNOSIS — I10 Essential (primary) hypertension: Secondary | ICD-10-CM | POA: Diagnosis not present

## 2020-12-17 DIAGNOSIS — K922 Gastrointestinal hemorrhage, unspecified: Secondary | ICD-10-CM | POA: Diagnosis not present

## 2020-12-17 DIAGNOSIS — N179 Acute kidney failure, unspecified: Secondary | ICD-10-CM | POA: Diagnosis not present

## 2020-12-17 DIAGNOSIS — D649 Anemia, unspecified: Secondary | ICD-10-CM | POA: Diagnosis not present

## 2020-12-17 LAB — CBG MONITORING, ED
Glucose-Capillary: 103 mg/dL — ABNORMAL HIGH (ref 70–99)
Glucose-Capillary: 111 mg/dL — ABNORMAL HIGH (ref 70–99)
Glucose-Capillary: 91 mg/dL (ref 70–99)
Glucose-Capillary: 95 mg/dL (ref 70–99)

## 2020-12-17 LAB — CBC
HCT: 27.8 % — ABNORMAL LOW (ref 36.0–46.0)
Hemoglobin: 8.9 g/dL — ABNORMAL LOW (ref 12.0–15.0)
MCH: 30.4 pg (ref 26.0–34.0)
MCHC: 32 g/dL (ref 30.0–36.0)
MCV: 94.9 fL (ref 80.0–100.0)
Platelets: 228 10*3/uL (ref 150–400)
RBC: 2.93 MIL/uL — ABNORMAL LOW (ref 3.87–5.11)
RDW: 14 % (ref 11.5–15.5)
WBC: 6.4 10*3/uL (ref 4.0–10.5)
nRBC: 0 % (ref 0.0–0.2)

## 2020-12-17 LAB — BASIC METABOLIC PANEL
Anion gap: 11 (ref 5–15)
BUN: 71 mg/dL — ABNORMAL HIGH (ref 8–23)
CO2: 26 mmol/L (ref 22–32)
Calcium: 8 mg/dL — ABNORMAL LOW (ref 8.9–10.3)
Chloride: 101 mmol/L (ref 98–111)
Creatinine, Ser: 4.93 mg/dL — ABNORMAL HIGH (ref 0.44–1.00)
GFR, Estimated: 9 mL/min — ABNORMAL LOW (ref >60)
Glucose, Bld: 111 mg/dL — ABNORMAL HIGH (ref 70–99)
Potassium: 4.3 mmol/L (ref 3.5–5.1)
Sodium: 138 mmol/L (ref 135–145)

## 2020-12-17 LAB — URINE CULTURE

## 2020-12-17 LAB — MAGNESIUM: Magnesium: 2.3 mg/dL (ref 1.7–2.4)

## 2020-12-17 LAB — GLUCOSE, CAPILLARY
Glucose-Capillary: 132 mg/dL — ABNORMAL HIGH (ref 70–99)
Glucose-Capillary: 218 mg/dL — ABNORMAL HIGH (ref 70–99)
Glucose-Capillary: 83 mg/dL (ref 70–99)

## 2020-12-17 LAB — PHOSPHORUS: Phosphorus: 5.8 mg/dL — ABNORMAL HIGH (ref 2.5–4.6)

## 2020-12-17 MED ORDER — SODIUM CHLORIDE 0.9 % IV SOLN: INTRAVENOUS | Status: DC

## 2020-12-17 MED ORDER — POLYETHYLENE GLYCOL 3350 17 G PO PACK
17.0000 g | PACK | Freq: Two times a day (BID) | ORAL | Status: DC
  Administered 2020-12-17: 17 g via ORAL
  Filled 2020-12-17: qty 1

## 2020-12-17 MED ORDER — FLEET ENEMA 7-19 GM/118ML RE ENEM: 1.0000 | ENEMA | Freq: Every day | RECTAL | Status: DC | PRN

## 2020-12-17 MED ORDER — ALBUMIN HUMAN 25 % IV SOLN
25.0000 g | Freq: Four times a day (QID) | INTRAVENOUS | Status: AC
Start: 2020-12-17 — End: 2020-12-18
  Administered 2020-12-17 – 2020-12-18 (×3): 25 g via INTRAVENOUS
  Filled 2020-12-17 (×4): qty 100

## 2020-12-17 MED ORDER — FLEET ENEMA 7-19 GM/118ML RE ENEM
1.0000 | ENEMA | Freq: Once | RECTAL | Status: AC
  Administered 2020-12-17: 1 via RECTAL
  Filled 2020-12-17: qty 1

## 2020-12-17 MED ORDER — HYDROXYZINE HCL 10 MG PO TABS
10.0000 mg | ORAL_TABLET | Freq: Three times a day (TID) | ORAL | Status: DC | PRN
  Filled 2020-12-17: qty 1

## 2020-12-17 MED ORDER — SENNOSIDES-DOCUSATE SODIUM 8.6-50 MG PO TABS
2.0000 | ORAL_TABLET | Freq: Two times a day (BID) | ORAL | Status: DC
  Administered 2020-12-17 – 2020-12-18 (×3): 2 via ORAL
  Filled 2020-12-17 (×3): qty 2

## 2020-12-17 MED ORDER — DARBEPOETIN ALFA 60 MCG/0.3ML IJ SOSY
60.0000 ug | PREFILLED_SYRINGE | Freq: Once | INTRAMUSCULAR | Status: AC
  Administered 2020-12-17: 60 ug via SUBCUTANEOUS
  Filled 2020-12-17: qty 0.3

## 2020-12-17 MED ORDER — SODIUM CHLORIDE 0.9 % IV SOLN
250.0000 mg | Freq: Once | INTRAVENOUS | Status: AC
  Administered 2020-12-17: 250 mg via INTRAVENOUS
  Filled 2020-12-17 (×2): qty 20

## 2020-12-17 NOTE — ED Notes (Signed)
Changed pt after enema, pt stool was watery. Changed pt for an additional time with a small solid BM.

## 2020-12-17 NOTE — Progress Notes (Signed)
PROGRESS NOTE    Brenda Patterson  HQP:[id number] DOB: 06-26-1941 DOA: 01/03/2021 PCP: [name], MD    No chief complaint on file.   Brief Narrative:  80 year old lady prior history of hypertension, hypothyroidism, type 2 diabetes, recently diagnosed with COVID-19 infection with resultant myopericarditis, not a candidate for any cardiac catheterization due to worsening renal parameters, discharged on November 20, 2020 presents today for generalized weakness and constipation. On arrival to ED labs show hemoglobin of 7.5 which have dropped from 9.1 last month. Stool for occult blood is positive. Creatinine worsened from 3.3 on discharge last month to 5.1 on admission. CT of the abdomen pelvis shows fecal impaction and bilateral pleural effusions. Patient was admitted for evaluation of GI bleed, symptomatic anemia, AKI, and significant constipation. GI consulted, recommended fecal disimpaction and enema.  Creatinine has improved to 4.9 this am.  Pt did not have any bowel movements yesterday.   Assessment & Plan:   Principal Problem:   Acute GI bleeding Active Problems:   Type 2 diabetes mellitus, controlled (HCC)   Essential hypertension   ARF (acute renal failure) (HCC)   Symptomatic anemia   Acute GI bleed/symptomatic anemia Drop of hemoglobin from 9-7.5. 1 unit of PRBC ordered.repeat hemoglobin post transfusion is around 8.9.  GI consulted recommended fecal disimpaction and enemas. Transfuse to keep hemoglobin greater than 7. Anemia panel shows adequate ferritin and iron levels.  AKI on stage 4 CKD Probably secondary to dehydration. No hydronephrosis on the CT abdomen pelvis. Creatinine has improved from 4.9 from gentle hydration. Pt has bilateral effusions in the lower lobes. Due to her history of cardiomyopathy, avoid fluid overload.  Nephrology consulted for recommendations.     Generalized weakness Probably secondary to symptomatic anemia. PT evaluation  will be ordered .   Cardiomyopathy Post Covid infection, echo shows left ventricular ejection fraction of 20 to 25%, possible myopericarditis. Continue with the home medications at this time. Patient remains on room air with good oxygen saturations at this time.    Abnormal lung findings with bilateral effusions. Patient remains on room air with good oxygen saturations. Continue to follow respiratory status.    Hypothyroidism Continue with Synthroid.   Type 2 diabetes mellitus Continue sliding scale insulin at this time. CBG (last 3)  Recent Labs    12/17/20 0413 12/17/20 0842 12/17/20 1207  GLUCAP 103* 91 111*   no changes in meds.    History of recent Covid infection She was diagnosed with Covid on January 10. She currently denies any respiratory symptoms at this time.   Fecal impaction Disimpaction and enema was ordered.   Hypertension Blood pressure parameters appear to be normal   DVT prophylaxis: SCD'S Code Status: (Full Code) Family Communication: none at bedside. Discussed the plan with her daughter over the phone.  Disposition:   Status is: Inpatient  Remains inpatient appropriate because:Ongoing diagnostic testing needed not appropriate for outpatient work up and Unsafe d/c plan   Dispo: The patient is from: Home              Anticipated d/c is to: pending              Anticipated d/c date is: 2 days              Patient currently is not medically stable to d/c.   Difficult to place patient No       Level of care: Telemetry Medical Consultants:   Gastroenterology.   Nephrology.  Procedures: none.    Antimicrobials: none.    Subjective: No chest pain or sob, no nausea, vomiting or abd pain.  Objective: Vitals:   12/17/20 1130 12/17/20 1230 12/17/20 1300 12/17/20 1330  BP: (!) 123/54 118/82 137/76 (!) 128/92  Pulse: 86 85 80 78  Resp:    17  Temp:      TempSrc:      SpO2: 95% 95% 95% 94%   No intake or output data in  the 24 hours ending 12/17/20 1610 There were no vitals filed for this visit.  Examination:  General exam: Ill-appearing lady not in any kind of distress Respiratory system: Diminished air entry at bases, no wheezing or rhonchi on room air Cardiovascular system: S1-S2 heard, regular rate rhythm, no JVD, no pedal edema Gastrointestinal system: Abdomen is soft, nontender, nondistended bowel sounds normal Central nervous system: Alert and able to answer simple questions. Extremities: No cyanosis Skin: No rashes seen Psychiatry: .  Flat affect   Data Reviewed: I have personally reviewed following labs and imaging studies  CBC: Recent Labs  Lab 12/17/2020 1750 12/16/20 0446 12/17/20 0319  WBC 5.5 5.7 6.4  NEUTROABS 3.7  --   --   HGB 7.5* 8.8* 8.9*  HCT 23.8* 26.9* 27.8*  MCV 96.4 93.1 94.9  PLT 224 216 932    Basic Metabolic Panel: Recent Labs  Lab 12/27/2020 1750 12/16/20 0446 12/17/20 0319  NA 136 138 138  K 4.2 4.2 4.3  CL 100 101 101  CO2 23 24 26   GLUCOSE 144* 112* 111*  BUN 72* 71* 71*  CREATININE 5.16* 4.99* 4.93*  CALCIUM 7.9* 8.0* 8.0*  MG 2.2  --   --     GFR: CrCl cannot be calculated (Unknown ideal weight.).  Liver Function Tests: Recent Labs  Lab 12/31/2020 1750 12/16/20 0446  AST 24 23  ALT 23 22  ALKPHOS 45 44  BILITOT 0.7 0.8  PROT 5.0* 4.9*  ALBUMIN 2.4* 2.3*    CBG: Recent Labs  Lab 12/16/20 2005 12/17/20 0002 12/17/20 0413 12/17/20 0842 12/17/20 1207  GLUCAP 131* 95 103* 91 111*     Recent Results (from the past 240 hour(s))  Urine culture     Status: Abnormal   Collection Time: 12/16/20  5:00 AM   Specimen: Urine, Random  Result Value Ref Range Status   Specimen Description URINE, RANDOM  Final   Special Requests   Final    NONE Performed at [facility], [place] [address] [place], [place] [address]    Culture MULTIPLE SPECIES PRESENT, SUGGEST RECOLLECTION (A)  Final   Report Status 12/17/2020 FINAL  Final          Radiology Studies: CT Abdomen Pelvis Wo Contrast  Result Date: 12/14/2020 CLINICAL DATA:  Acute nonlocalized abdominal pain. EXAM: CT ABDOMEN AND PELVIS WITHOUT CONTRAST TECHNIQUE: Multidetector CT imaging of the abdomen and pelvis was performed following the standard protocol without IV contrast. COMPARISON:  07/07/2011 FINDINGS: Lower chest: Bilateral effusions layering dependently. Cardiomegaly. Abnormal patchy pulmonary markings that could be a combination of chronic and acute lung disease, including the possibility acute edema and pneumonia. Hepatobiliary: Previous cholecystectomy. No focal liver parenchymal lesion. Pancreas: Normal Spleen: Normal Adrenals/Urinary Tract: Adrenal glands are normal. Kidneys are small. Small low-density areas in both kidneys, indeterminate by CT. Cysts shown by recent ultrasound. Stomach/Bowel: Stomach and small intestine are normal. No acute colon pathology. Fecal impaction in the rectum. Vascular/Lymphatic: Aortic atherosclerotic calcification. No aneurysm. IVC is normal. No  adenopathy. Reproductive: No pelvic mass. Other: No free fluid or air. Musculoskeletal: No evidence of hernia. Chronic degenerative changes throughout the lumbar spine. Old minor superior endplate fracture at H84. IMPRESSION: 1. Fecal impaction in the rectum which could be symptomatic. 2. Bilateral pleural effusions layering dependently. Abnormal patchy pulmonary markings that could be a combination of chronic and acute lung disease, including the possibility of acute edema and patchy pneumonia. 3. Aortic atherosclerosis. 4. Small kidneys. Aortic Atherosclerosis (ICD10-I70.0). Electronically Signed   By: Nelson Chimes M.D.   On: 12/18/2020 20:48   DG Chest Portable 1 View  Result Date: 01/03/2021 CLINICAL DATA:  80 year old female with weakness. EXAM: PORTABLE CHEST 1 VIEW COMPARISON:  Chest radiograph dated 04/14/2021. FINDINGS: Shallow inspiration with bibasilar atelectasis. The  cardiomegaly with vascular congestion. No pleural effusion or pneumothorax. Faint right peripheral/subpleural haziness may represent atelectasis or scarring. Atypical infiltrate is less likely but not excluded clinical correlation recommended. Atherosclerotic calcification of the aorta. No acute osseous pathology. IMPRESSION: Cardiomegaly with vascular congestion.  No focal consolidation. Electronically Signed   By: Anner Crete M.D.   On: 12/14/2020 20:12        Scheduled Meds: . carvedilol  6.25 mg Oral BID WC  . darbepoetin (ARANESP) injection - NON-DIALYSIS  60 mcg Subcutaneous Once  . insulin aspart  0-6 Units Subcutaneous Q4H  . isosorbide mononitrate  30 mg Oral Daily  . levothyroxine  25 mcg Oral QAC breakfast  . pantoprazole (PROTONIX) IV  40 mg Intravenous Q12H  . rosuvastatin  20 mg Oral Daily  . senna-docusate  2 tablet Oral BID  . sertraline  50 mg Oral Daily   Continuous Infusions: . sodium chloride    . albumin human    . ferric gluconate (FERRLECIT/NULECIT) IV       LOS: 2 days        Hosie Poisson, MD [facility]   To contact the attending provider between 7A-7P or the covering provider during after hours 7P-7A, please log into the web site [email or web address] and access using universal [place] password for that web site. If you do not have the password, please call the hospital operator.  12/17/2020, 4:10 PM

## 2020-12-17 NOTE — Progress Notes (Signed)
PT Cancellation Note  Patient Details Name: Brenda Patterson MRN: [id number] DOB: 11/13/1940   Cancelled Treatment:    Reason Eval/Treat Not Completed: Other (comment);Patient at procedure or test/unavailable.  Pt is beginning an enema and will not be able to ambulate with PT.  Follow up at another time.   Ramond Dial 12/17/2020, 1:10 PM Mee Hives, PT MS Acute Rehab Dept. Number: [place]

## 2020-12-17 NOTE — ED Notes (Signed)
Pt's CBG result 91. Informed Zoe - RN.

## 2020-12-17 NOTE — ED Notes (Signed)
Breakfast ordered 

## 2020-12-17 NOTE — Plan of Care (Signed)

## 2020-12-17 NOTE — ED Notes (Signed)
Lunch Tray Ordered @ 1030. 

## 2020-12-17 NOTE — ED Notes (Signed)
Ivin Booty (Daughter) called for an update.  Thank u

## 2020-12-17 NOTE — Progress Notes (Signed)
Subjective: Feeling better after the disimpaction.  Objective: Vital signs in last 24 hours: Temp:  [97.7 F (36.5 C)] 97.7 F (36.5 C) (02/11 1026) Pulse Rate:  [76-94] 78 (02/11 1330) Resp:  [16-20] 17 (02/11 1330) BP: (111-151)/(46-97) 128/92 (02/11 1330) SpO2:  [91 %-100 %] 94 % (02/11 1330)    Intake/Output from previous day: No intake/output data recorded. Intake/Output this shift: No intake/output data recorded.  General appearance: alert and no distress GI: soft, non-tender; bowel sounds normal; no masses,  no organomegaly  Lab Results: Recent Labs    12/13/2020 1750 12/16/20 0446 12/17/20 0319  WBC 5.5 5.7 6.4  HGB 7.5* 8.8* 8.9*  HCT 23.8* 26.9* 27.8*  PLT 224 216 228   BMET Recent Labs    12/26/2020 1750 12/16/20 0446 12/17/20 0319  NA 136 138 138  K 4.2 4.2 4.3  CL 100 101 101  CO2 23 24 26   GLUCOSE 144* 112* 111*  BUN 72* 71* 71*  CREATININE 5.16* 4.99* 4.93*  CALCIUM 7.9* 8.0* 8.0*   LFT Recent Labs    12/16/20 0446  PROT 4.9*  ALBUMIN 2.3*  AST 23  ALT 22  ALKPHOS 44  BILITOT 0.8   PT/INR No results for input(s): LABPROT, INR in the last 72 hours. Hepatitis Panel No results for input(s): HEPBSAG, HCVAB, HEPAIGM, HEPBIGM in the last 72 hours. C-Diff No results for input(s): CDIFFTOX in the last 72 hours. Fecal Lactopherrin No results for input(s): FECLLACTOFRN in the last 72 hours.  Studies/Results: CT Abdomen Pelvis Wo Contrast  Result Date: 12/14/2020 CLINICAL DATA:  Acute nonlocalized abdominal pain. EXAM: CT ABDOMEN AND PELVIS WITHOUT CONTRAST TECHNIQUE: Multidetector CT imaging of the abdomen and pelvis was performed following the standard protocol without IV contrast. COMPARISON:  07/07/2011 FINDINGS: Lower chest: Bilateral effusions layering dependently. Cardiomegaly. Abnormal patchy pulmonary markings that could be a combination of chronic and acute lung disease, including the possibility acute edema and pneumonia. Hepatobiliary:  Previous cholecystectomy. No focal liver parenchymal lesion. Pancreas: Normal Spleen: Normal Adrenals/Urinary Tract: Adrenal glands are normal. Kidneys are small. Small low-density areas in both kidneys, indeterminate by CT. Cysts shown by recent ultrasound. Stomach/Bowel: Stomach and small intestine are normal. No acute colon pathology. Fecal impaction in the rectum. Vascular/Lymphatic: Aortic atherosclerotic calcification. No aneurysm. IVC is normal. No adenopathy. Reproductive: No pelvic mass. Other: No free fluid or air. Musculoskeletal: No evidence of hernia. Chronic degenerative changes throughout the lumbar spine. Old minor superior endplate fracture at E08. IMPRESSION: 1. Fecal impaction in the rectum which could be symptomatic. 2. Bilateral pleural effusions layering dependently. Abnormal patchy pulmonary markings that could be a combination of chronic and acute lung disease, including the possibility of acute edema and patchy pneumonia. 3. Aortic atherosclerosis. 4. Small kidneys. Aortic Atherosclerosis (ICD10-I70.0). Electronically Signed   By: Nelson Chimes M.D.   On: 12/22/2020 20:48   DG Chest Portable 1 View  Result Date: 12/29/2020 CLINICAL DATA:  80 year old female with weakness. EXAM: PORTABLE CHEST 1 VIEW COMPARISON:  Chest radiograph dated 04/14/2021. FINDINGS: Shallow inspiration with bibasilar atelectasis. The cardiomegaly with vascular congestion. No pleural effusion or pneumothorax. Faint right peripheral/subpleural haziness may represent atelectasis or scarring. Atypical infiltrate is less likely but not excluded clinical correlation recommended. Atherosclerotic calcification of the aorta. No acute osseous pathology. IMPRESSION: Cardiomegaly with vascular congestion.  No focal consolidation. Electronically Signed   By: Anner Crete M.D.   On: 12/28/2020 20:12    Medications:  Scheduled: . carvedilol  6.25 mg Oral BID WC  .  darbepoetin (ARANESP) injection - NON-DIALYSIS  60 mcg  Subcutaneous Once  . insulin aspart  0-6 Units Subcutaneous Q4H  . isosorbide mononitrate  30 mg Oral Daily  . levothyroxine  25 mcg Oral QAC breakfast  . pantoprazole (PROTONIX) IV  40 mg Intravenous Q12H  . rosuvastatin  20 mg Oral Daily  . senna-docusate  2 tablet Oral BID  . sertraline  50 mg Oral Daily   Continuous: . sodium chloride    . albumin human    . ferric gluconate (FERRLECIT/NULECIT) IV      Assessment/Plan: 1) Fecal impaction. 2) Anemia - stable.   I disimpacted the patient.  It is estimated that 80-90% of the impaction was removed.  It is soft formless stool in the rectal vault.  This is more amenable to enemas to clear out the residual stool.  There was hemorrhoidal bleeding with the disimpaction.  Her anemia is stable after her transfusion.  This issue is not new and it can be addressed as an outpatient provided that she remains stable.  Plan: 1) Start a laxative regimen of Miralax 15 grams TID.  Titrate for effect and maintenance. 2) Continue with enemas for the next 1-2 days to evacuate the rectum. 3) Upon discharge, follow up in the office with Dr. Collene Mares.   LOS: 2 days   Keishia Ground D 12/17/2020, 4:08 PM

## 2020-12-17 NOTE — ED Notes (Signed)
MD at bedside to disimpact pt

## 2020-12-17 NOTE — Consult Note (Signed)
[facility] Nephrology Consultation Note  Requesting MD: Dr Karleen Hampshire, V Reason for consult: AKI on CKD  HPI:  Brenda Patterson is a 80 y.o. female with past medical history of DM, HTN, hypothyroidism, CKD stage IV baseline creatinine level around today, recent diagnosis of COVID-19 infection presented with generalized weakness, constipation, decreased oral intake, seen as a consultation for AKI on CKD. She was discharged from hospital on 1/15 after COVID-19 infection causing myopericarditis, did not get a cardiac catheterization because of worsening renal function.  For CKD, thought to be due to DM and hypertension.  Follows with Dr. Candiss Norse at [facility] office.  Baseline creatinine level seems to be around 2.6 however the creatinine level worsened to around 3.4 in 11/2020 with Covid infection.  On arrival to the ER on 2/9, hemoglobin was 7.5, occult blood positive, creatinine level elevated to 5.16.  CT scan showed fecal impaction and some pleural effusion.  Seen by GI recommended edema.  She received PRBC and gentle IV hydration.  Her creatinine level is stable at 4.9 and hemoglobin improved to 8.9 today.  No urine output measured. Urinalysis with no microscopic hematuria, minimal protein which is chronic.  CT abdomen pelvis with small kidneys without any acute finding.  The echo showed EF of 20 to 25%.  Patient complains of generalized weakness, fatigue and decreased oral intake.  Lasix listed as a home medication.  Denies chest pain, shortness of breath, headache.   Creatinine, Ser  Date/Time Value Ref Range Status  12/17/2020 03:19 AM 4.93 (H) 0.44 - 1.00 mg/dL Final  12/16/2020 04:46 AM 4.99 (H) 0.44 - 1.00 mg/dL Final  12/14/2020 05:50 PM 5.16 (H) 0.44 - 1.00 mg/dL Final  11/20/2020 12:37 AM 3.33 (H) 0.44 - 1.00 mg/dL Final  11/19/2020 01:35 AM 3.41 (H) 0.44 - 1.00 mg/dL Final  11/18/2020 01:26 PM 3.41 (H) 0.44 - 1.00 mg/dL Final  11/18/2020 12:40 AM 3.44 (H) 0.44  - 1.00 mg/dL Final  11/17/2020 01:09 AM 3.34 (H) 0.44 - 1.00 mg/dL Final  11/16/2020 05:00 AM 3.32 (H) 0.44 - 1.00 mg/dL Final  11/14/2020 02:51 PM 3.02 (H) 0.44 - 1.00 mg/dL Final  11/12/2020 09:20 PM 3.09 (H) 0.44 - 1.00 mg/dL Final  06/16/2019 08:05 AM 2.22 (H) 0.40 - 1.20 mg/dL Final    PMHx:   Past Medical History:  Diagnosis Date  . Allergy   . Arthritis   . 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 Diabetes mellitus   . Hyperlipidemia   . Hypertension   . LBBB (left bundle branch block) 11/15/2020  . Migraines     Past Surgical History:  Procedure Laterality Date  . CHOLECYSTECTOMY    . TUBAL LIGATION      Family Hx:  Family History  Problem Relation Age of Onset  . Arthritis Other        fhx  . Hyperlipidemia Other        fhx  . Hypertension Other        fhx  . Diabetes Other        fhx  . Stroke Other        fhx  . Breast cancer Sister     Social History:  reports that she has never smoked. She has never used smokeless tobacco. She reports that she does not drink alcohol and does not use drugs.  Allergies:  Allergies  Allergen Reactions  . Clarithromycin Other (See Comments)  . Heparin Other (  See Comments)    Patient states it makes her weak  . Azithromycin Nausea Only  . Penicillins Rash    Rash noted inside her mouth and lips    Medications: Prior to Admission medications   Medication Sig Start Date End Date Taking? Authorizing Provider  albuterol (VENTOLIN HFA) 108 (90 Base) MCG/ACT inhaler Inhale 2 puffs into the lungs every 6 (six) hours as needed for wheezing or shortness of breath. 08/10/20 09/09/20 Yes Burchette, Alinda Sierras, MD  apixaban (ELIQUIS) 5 MG TABS tablet Take 5 mg by mouth 2 (two) times daily. 12/05/20  Yes [provider]  aspirin EC 81 MG tablet Take 81 mg by mouth daily.   Yes [provider]  Calcium Carbonate-Vit D-Min (CALTRATE PLUS PO) Take 1 tablet by mouth daily.   Yes [provider]  carvedilol (COREG) 12.5 MG tablet Take 12.5 mg by mouth 2 (two) times daily with a meal.   Yes [provider]  Cholecalciferol (VITAMIN D) 50 MCG (2000 UT) tablet Take 2,000 Units by mouth daily.   Yes [provider]  furosemide (LASIX) 20 MG tablet Take 20 mg by mouth 3 (three) times daily. 12/05/20  Yes [provider]  glipiZIDE (GLUCOTROL XL) 2.5 MG 24 hr tablet TAKE 1 TABLET BY MOUTH  DAILY WITH BREAKFAST Patient taking differently: Take 2.5 mg by mouth daily with breakfast. 06/23/20  Yes Burchette, Alinda Sierras, MD  hydrALAZINE (APRESOLINE) 50 MG tablet Take 50 mg by mouth 3 (three) times daily. 12/05/20  Yes [provider]  isosorbide mononitrate (IMDUR) 30 MG 24 hr tablet Take 1 tablet (30 mg total) by mouth daily. 11/20/20  Yes Little Ishikawa, MD  levothyroxine (SYNTHROID) 25 MCG tablet TAKE 1 TABLET BY MOUTH ONCE DAILY WITH BREAKFAST Patient taking differently: Take 25 mcg by mouth daily before breakfast. 08/18/20  Yes Burchette, Alinda Sierras, MD  loperamide (IMODIUM A-D) 2 MG tablet Take 1 tablet (2 mg total) by mouth 4 (four) times daily as needed for diarrhea or loose stools. Patient taking differently: Take 2 mg by mouth 4 (four) times daily as needed for diarrhea or loose stools (max 8 capsules (16 mg)). 11/12/20  Yes Fredia Sorrow, MD  nitroGLYCERIN (NITROSTAT) 0.4 MG SL tablet Place 0.4 mg under the tongue every 5 (five) minutes as needed for chest pain (max 3 doses). 12/05/20  Yes [provider]  ondansetron (ZOFRAN) 4 MG tablet Take 1 tablet (4 mg total) by mouth every 8 (eight) hours as needed for nausea or vomiting. 11/09/20  Yes Colin Benton R, DO  pyridOXINE (VITAMIN B-6) 100 MG tablet Take 100 mg by mouth daily.   Yes [provider]  rosuvastatin (CRESTOR) 20 MG tablet Take 1 tablet (20 mg total) by mouth daily. 08/10/20  Yes Burchette, Alinda Sierras, MD  sertraline (ZOLOFT) 50 MG tablet TAKE 1 TABLET BY MOUTH  DAILY Patient taking  differently: Take 50 mg by mouth daily. 08/18/20  Yes Burchette, Alinda Sierras, MD  Zinc 220 (50 Zn) MG CAPS Take 220 mg by mouth daily.   Yes [provider]  zolpidem (AMBIEN) 5 MG tablet Take 1 tablet (5 mg total) by mouth at bedtime as needed for sleep. 12/13/20  Yes Burchette, Alinda Sierras, MD    I have reviewed the patient's current medications.  Labs:  Results for orders placed or performed during the hospital encounter of 12/24/2020 (from the past 48 hour(s))  Comprehensive metabolic panel     Status: Abnormal  Collection Time: 12/10/2020  5:50 PM  Result Value Ref Range   Sodium 136 135 - 145 mmol/L   Potassium 4.2 3.5 - 5.1 mmol/L   Chloride 100 98 - 111 mmol/L   CO2 23 22 - 32 mmol/L   Glucose, Bld 144 (H) 70 - 99 mg/dL    Comment: Glucose reference range applies only to samples taken after fasting for at least 8 hours.   BUN 72 (H) 8 - 23 mg/dL   Creatinine, Ser 5.16 (H) 0.44 - 1.00 mg/dL   Calcium 7.9 (L) 8.9 - 10.3 mg/dL   Total Protein 5.0 (L) 6.5 - 8.1 g/dL   Albumin 2.4 (L) 3.5 - 5.0 g/dL   AST 24 15 - 41 U/L   ALT 23 0 - 44 U/L   Alkaline Phosphatase 45 38 - 126 U/L   Total Bilirubin 0.7 0.3 - 1.2 mg/dL   GFR, Estimated 8 (L) >60 mL/min    Comment: (NOTE) Calculated using the CKD-EPI Creatinine Equation (2021)    Anion gap 13 5 - 15    Comment: Performed at [facility], [place] [address] [place], [place] [address]  CBC with Differential     Status: Abnormal   Collection Time: 12/14/2020  5:50 PM  Result Value Ref Range   WBC 5.5 4.0 - 10.5 K/uL   RBC 2.47 (L) 3.87 - 5.11 MIL/uL   Hemoglobin 7.5 (L) 12.0 - 15.0 g/dL   HCT 23.8 (L) 36.0 - 46.0 %   MCV 96.4 80.0 - 100.0 fL   MCH 30.4 26.0 - 34.0 pg   MCHC 31.5 30.0 - 36.0 g/dL   RDW 13.6 11.5 - 15.5 %   Platelets 224 150 - 400 K/uL   nRBC 0.0 0.0 - 0.2 %   Neutrophils Relative % 68 %   Neutro Abs 3.7 1.7 - 7.7 K/uL   Lymphocytes Relative 19 %   Lymphs Abs 1.1 0.7 - 4.0 K/uL   Monocytes Relative 11 %    Monocytes Absolute 0.6 0.1 - 1.0 K/uL   Eosinophils Relative 2 %   Eosinophils Absolute 0.1 0.0 - 0.5 K/uL   Basophils Relative 0 %   Basophils Absolute 0.0 0.0 - 0.1 K/uL   Immature Granulocytes 0 %   Abs Immature Granulocytes 0.02 0.00 - 0.07 K/uL    Comment: Performed at [facility], [address], [place], [place] [address]  Troponin I (High Sensitivity)     Status: Abnormal   Collection Time: 12/23/2020  5:50 PM  Result Value Ref Range   Troponin I (High Sensitivity) 410 (HH) <18 ng/L    Comment: CRITICAL RESULT CALLED TO, READ BACK BY AND VERIFIED WITH:  Barbaraann Boys RN @1950  12/27/2020 K. SANDERS (NOTE) Elevated high sensitivity troponin I (hsTnI) values and significant  changes across serial measurements may suggest ACS but many other  chronic and acute conditions are known to elevate hsTnI results.  Refer to the Links section for chest pain algorithms and additional  guidance. Performed at [facility], [place] [address], [place], [place] [address]   Brain natriuretic peptide     Status: Abnormal   Collection Time: 12/30/2020  5:50 PM  Result Value Ref Range   B Natriuretic Peptide 1,827.3 (H) 0.0 - 100.0 pg/mL    Comment: Performed at [place] [address], [place], [place] [address]  Magnesium     Status: None   Collection Time: 12/14/2020  5:50 PM  Result  Value Ref Range   Magnesium 2.2 1.7 - 2.4 mg/dL    Comment: Performed at [facility], [place] [address] [place], [place] [address]  Lactic acid, plasma     Status: None   Collection Time: 12/24/2020  6:32 PM  Result Value Ref Range   Lactic Acid, Venous 1.0 0.5 - 1.9 mmol/L    Comment: Performed at [facility] [address] [place], [place] [address]  Troponin I (High Sensitivity)     Status: Abnormal   Collection Time: 12/17/2020  7:50 PM  Result Value Ref Range   Troponin I (High Sensitivity) 395 (HH) <18 ng/L    Comment: CRITICAL VALUE NOTED.  VALUE IS CONSISTENT WITH PREVIOUSLY  REPORTED AND CALLED VALUE. (NOTE) Elevated high sensitivity troponin I (hsTnI) values and significant  changes across serial measurements may suggest ACS but many other  chronic and acute conditions are known to elevate hsTnI results.  Refer to the Links section for chest pain algorithms and additional  guidance. Performed at [facility], [place] [address] [place], [place] [address]   Lactic acid, plasma     Status: None   Collection Time: 12/12/2020  7:51 PM  Result Value Ref Range   Lactic Acid, Venous 1.4 0.5 - 1.9 mmol/L    Comment: Performed at [facility] [address] [place], [place] [address]  Folate     Status: None   Collection Time: 12/07/2020  8:03 PM  Result Value Ref Range   Folate 14.1 >5.9 ng/mL    Comment: Performed at [facility] [address] [place], [place] [address]  Reticulocytes     Status: Abnormal   Collection Time: 12/12/2020  8:03 PM  Result Value Ref Range   Retic Ct Pct 2.2 0.4 - 3.1 %   RBC. 2.66 (L) 3.87 - 5.11 MIL/uL   Retic Count, Absolute 58.8 19.0 - 186.0 K/uL   Immature Retic Fract 8.3 2.3 - 15.9 %    Comment: Performed at [facility] [address] [place], [place] [address]  Prepare RBC (crossmatch)     Status: None   Collection Time: 12/16/2020  8:06 PM  Result Value Ref Range   Order Confirmation      ORDER PROCESSED BY BLOOD BANK Performed at [facility], [place] [address], [place], [place] [address]   Type and screen     Status: None   Collection Time: 12/10/2020  8:14 PM  Result Value Ref Range   ABO/RH(D) O POS    Antibody Screen NEG    Sample Expiration 12/18/2020,2359    Unit Number [id number]    Blood Component Type RED CELLS,LR    Unit division 00    Status of Unit ISSUED,FINAL    Transfusion Status OK TO TRANSFUSE    Crossmatch Result      Compatible Performed at [facility], [place] [address] [place], [place] [address]   POC occult blood, ED     Status: Abnormal   Collection  Time: 12/24/2020  8:18 PM  Result Value Ref Range   Fecal Occult Bld POSITIVE (A) NEGATIVE  Vitamin B12     Status: None   Collection Time: 12/20/2020  8:26 PM  Result Value Ref Range   Vitamin B-12 186 180 - 914 pg/mL    Comment: (NOTE) This assay is not validated for testing neonatal or myeloproliferative syndrome specimens for Vitamin B12 levels. Performed at [facility], [place] [address], [place], [place] [address]   Iron and TIBC     Status: Abnormal   Collection Time: 12/14/2020  8:26 PM  Result Value Ref Range   Iron 39 28 - 170 ug/dL   TIBC 239 (L) 250 - 450 ug/dL   Saturation Ratios 16 10.4 - 31.8 %   UIBC 200 ug/dL    Comment: Performed at [facility], [place] [address] [place], [place] [address]  Ferritin     Status: None   Collection Time: 12/10/2020  8:26 PM  Result Value Ref Range   Ferritin 176 11 - 307 ng/mL    Comment: Performed at [facility], [place] [address] [place], [place] [address]  CBG monitoring, ED     Status: None   Collection Time: 01/02/2021 11:15 PM  Result Value Ref Range   Glucose-Capillary 95 70 - 99 mg/dL    Comment: Glucose reference range applies only to samples taken after fasting for at least 8 hours.  CBG monitoring, ED     Status: Abnormal   Collection Time: 12/16/20  2:15 AM  Result Value Ref Range   Glucose-Capillary 68 (L) 70 - 99 mg/dL    Comment: Glucose reference range applies only to samples taken after fasting for at least 8 hours.  CBG monitoring, ED     Status: Abnormal   Collection Time: 12/16/20  2:47 AM  Result Value Ref Range   Glucose-Capillary 122 (H) 70 - 99 mg/dL    Comment: Glucose reference range applies only to samples taken after fasting for at least 8 hours.  CBG monitoring, ED     Status: Abnormal   Collection Time: 12/16/20  3:44 AM  Result Value Ref Range   Glucose-Capillary 145 (H) 70 - 99 mg/dL    Comment: Glucose reference range applies only to samples taken after fasting for at least 8 hours.   Comprehensive metabolic panel     Status: Abnormal   Collection Time: 12/16/20  4:46 AM  Result Value Ref Range   Sodium 138 135 - 145 mmol/L   Potassium 4.2 3.5 - 5.1 mmol/L   Chloride 101 98 - 111 mmol/L   CO2 24 22 - 32 mmol/L   Glucose, Bld 112 (H) 70 - 99 mg/dL    Comment: Glucose reference range applies only to samples taken after fasting for at least 8 hours.   BUN 71 (H) 8 - 23 mg/dL   Creatinine, Ser 4.99 (H) 0.44 - 1.00 mg/dL   Calcium 8.0 (L) 8.9 - 10.3 mg/dL   Total Protein 4.9 (L) 6.5 - 8.1 g/dL   Albumin 2.3 (L) 3.5 - 5.0 g/dL   AST 23 15 - 41 U/L   ALT 22 0 - 44 U/L   Alkaline Phosphatase 44 38 - 126 U/L   Total Bilirubin 0.8 0.3 - 1.2 mg/dL   GFR, Estimated 8 (L) >60 mL/min    Comment: (NOTE) Calculated using the CKD-EPI Creatinine Equation (2021)    Anion gap 13 5 - 15    Comment: Performed at [facility], [place] [address] [place] [address]  CBC     Status: Abnormal   Collection Time: 12/16/20  4:46 AM  Result Value Ref Range   WBC 5.7 4.0 - 10.5 K/uL   RBC 2.89 (L) 3.87 - 5.11 MIL/uL   Hemoglobin 8.8 (L) 12.0 - 15.0 g/dL   HCT 26.9 (L) 36.0 - 46.0 %  MCV 93.1 80.0 - 100.0 fL   MCH 30.4 26.0 - 34.0 pg   MCHC 32.7 30.0 - 36.0 g/dL   RDW 14.5 11.5 - 15.5 %   Platelets 216 150 - 400 K/uL   nRBC 0.0 0.0 - 0.2 %    Comment: Performed at [facility] [address] [place], [place] [address]  Urine culture     Status: Abnormal   Collection Time: 12/16/20  5:00 AM   Specimen: Urine, Random  Result Value Ref Range   Specimen Description URINE, RANDOM    Special Requests      NONE Performed at [facility], [place] [address] [place], [place] [address]    Culture MULTIPLE SPECIES PRESENT, SUGGEST RECOLLECTION (A)    Report Status 12/17/2020 FINAL   Urinalysis, Routine w reflex microscopic Urine, Clean Catch     Status: Abnormal   Collection Time: 12/16/20  5:07 AM  Result Value Ref Range   Color, Urine YELLOW YELLOW    APPearance CLEAR CLEAR   Specific Gravity, Urine 1.020 1.005 - 1.030   pH 6.0 5.0 - 8.0   Glucose, UA NEGATIVE NEGATIVE mg/dL   Hgb urine dipstick MODERATE (A) NEGATIVE   Bilirubin Urine NEGATIVE NEGATIVE   Ketones, ur NEGATIVE NEGATIVE mg/dL   Protein, ur 30 (A) NEGATIVE mg/dL   Nitrite NEGATIVE NEGATIVE   Leukocytes,Ua SMALL (A) NEGATIVE    Comment: Performed at [facility], [place] [address], [place], [place] [address]  Urinalysis, Microscopic (reflex)     Status: None   Collection Time: 12/16/20  5:07 AM  Result Value Ref Range   RBC / HPF 0-5 0 - 5 RBC/hpf   WBC, UA 6-10 0 - 5 WBC/hpf   Bacteria, UA NONE SEEN NONE SEEN   Squamous Epithelial / LPF 0-5 0 - 5    Comment: Performed at [facility], [place] [address], [place], [place] [address]  CBG monitoring, ED     Status: Abnormal   Collection Time: 12/16/20 10:59 AM  Result Value Ref Range   Glucose-Capillary 122 (H) 70 - 99 mg/dL    Comment: Glucose reference range applies only to samples taken after fasting for at least 8 hours.  CBG monitoring, ED     Status: Abnormal   Collection Time: 12/16/20  4:41 PM  Result Value Ref Range   Glucose-Capillary 132 (H) 70 - 99 mg/dL    Comment: Glucose reference range applies only to samples taken after fasting for at least 8 hours.  CBG monitoring, ED     Status: Abnormal   Collection Time: 12/16/20  6:28 PM  Result Value Ref Range   Glucose-Capillary 148 (H) 70 - 99 mg/dL    Comment: Glucose reference range applies only to samples taken after fasting for at least 8 hours.  CBG monitoring, ED     Status: Abnormal   Collection Time: 12/16/20  8:05 PM  Result Value Ref Range   Glucose-Capillary 131 (H) 70 - 99 mg/dL    Comment: Glucose reference range applies only to samples taken after fasting for at least 8 hours.  CBG monitoring, ED     Status: None   Collection Time: 12/17/20 12:02 AM  Result Value Ref Range   Glucose-Capillary 95 70 - 99 mg/dL    Comment: Glucose  reference range applies only to samples taken after fasting for at least 8 hours.  CBC     Status: Abnormal   Collection Time:  12/17/20  3:19 AM  Result Value Ref Range   WBC 6.4 4.0 - 10.5 K/uL   RBC 2.93 (L) 3.87 - 5.11 MIL/uL   Hemoglobin 8.9 (L) 12.0 - 15.0 g/dL   HCT 27.8 (L) 36.0 - 46.0 %   MCV 94.9 80.0 - 100.0 fL   MCH 30.4 26.0 - 34.0 pg   MCHC 32.0 30.0 - 36.0 g/dL   RDW 14.0 11.5 - 15.5 %   Platelets 228 150 - 400 K/uL   nRBC 0.0 0.0 - 0.2 %    Comment: Performed at [facility], [place] [address] [place], [place] [address]  Basic metabolic panel     Status: Abnormal   Collection Time: 12/17/20  3:19 AM  Result Value Ref Range   Sodium 138 135 - 145 mmol/L   Potassium 4.3 3.5 - 5.1 mmol/L   Chloride 101 98 - 111 mmol/L   CO2 26 22 - 32 mmol/L   Glucose, Bld 111 (H) 70 - 99 mg/dL    Comment: Glucose reference range applies only to samples taken after fasting for at least 8 hours.   BUN 71 (H) 8 - 23 mg/dL   Creatinine, Ser 4.93 (H) 0.44 - 1.00 mg/dL   Calcium 8.0 (L) 8.9 - 10.3 mg/dL   GFR, Estimated 9 (L) >60 mL/min    Comment: (NOTE) Calculated using the CKD-EPI Creatinine Equation (2021)    Anion gap 11 5 - 15    Comment: Performed at [facility] [address] [place], [place] [address]  CBG monitoring, ED     Status: Abnormal   Collection Time: 12/17/20  4:13 AM  Result Value Ref Range   Glucose-Capillary 103 (H) 70 - 99 mg/dL    Comment: Glucose reference range applies only to samples taken after fasting for at least 8 hours.  CBG monitoring, ED     Status: None   Collection Time: 12/17/20  8:42 AM  Result Value Ref Range   Glucose-Capillary 91 70 - 99 mg/dL    Comment: Glucose reference range applies only to samples taken after fasting for at least 8 hours.   Comment 1 Notify RN    Comment 2 Document in Chart   CBG monitoring, ED     Status: Abnormal   Collection Time: 12/17/20 12:07 PM  Result Value Ref Range   Glucose-Capillary 111 (H)  70 - 99 mg/dL    Comment: Glucose reference range applies only to samples taken after fasting for at least 8 hours.   Comment 1 Notify RN    Comment 2 Document in Chart      ROS:  Pertinent items noted in HPI and remainder of comprehensive ROS otherwise negative.  Physical Exam: Vitals:   12/17/20 1300 12/17/20 1330  BP: 137/76 (!) 128/92  Pulse: 80 78  Resp:  17  Temp:    SpO2: 95% 94%     General exam: Frail female lying on bed comfortable, not in distress Respiratory system: Clear to auscultation. Respiratory effort normal. No wheezing or crackle Cardiovascular system: S1 & S2 heard, RRR.  No pedal edema. Gastrointestinal system: Abdomen is nondistended, soft and nontender. Normal bowel sounds heard. Central nervous system: Alert awake. No focal neurological deficits. Extremities: Symmetric 5 x 5 power. Skin: No rashes, lesions or ulcers Psychiatry: Judgement and insight appear normal. Mood & affect appropriate.   Assessment/Plan:  #Acute kidney injury on CKD stage IV likely hemodynamically mediated in the setting of decreased oral intake/dehydration  with recent COVID-19 infection.  She looks dry on physical exam.  UA unremarkable and CT scan of abdomen pelvis without any hydronephrosis.  I will start gentle IV hydration and albumin to expand intravascular volume.  Strict ins and outs, daily lab.  No need for dialysis.  Continue to monitor closely.  #Acute blood loss anemia, GI bleed: Symptomatic on admission.  Received blood transfusion.  CT scan with fecal impaction.  Recommend to avoid phosphorus or magnesium containing anemia.  Iron saturation 16%.  I will order a dose of iron and Aranesp.  #Generalized weakness/fatigue: With recent Covid infection and anemia.  Continue PT OT eval and supportive treatment.  #Recent Covid infection.  No pulmonary symptoms.  #Chronic CHF, EF of 20 to 25%.  Looks dry on exam as discussed above.  She will need cardiology follow-up.  Thank  you for the consult.We will continue to follow with you. Discussed with the primary team.  Chanel Mcadams Tanna Furry 12/17/2020, 2:04 PM  [facility].

## 2020-12-18 ENCOUNTER — Inpatient Hospital Stay (HOSPITAL_COMMUNITY): Payer: Medicare Other

## 2020-12-18 DIAGNOSIS — N179 Acute kidney failure, unspecified: Secondary | ICD-10-CM | POA: Diagnosis not present

## 2020-12-18 DIAGNOSIS — Z66 Do not resuscitate: Secondary | ICD-10-CM

## 2020-12-18 DIAGNOSIS — Z515 Encounter for palliative care: Secondary | ICD-10-CM | POA: Diagnosis not present

## 2020-12-18 DIAGNOSIS — I10 Essential (primary) hypertension: Secondary | ICD-10-CM | POA: Diagnosis not present

## 2020-12-18 DIAGNOSIS — Z7189 Other specified counseling: Secondary | ICD-10-CM

## 2020-12-18 DIAGNOSIS — D649 Anemia, unspecified: Secondary | ICD-10-CM | POA: Diagnosis not present

## 2020-12-18 DIAGNOSIS — K922 Gastrointestinal hemorrhage, unspecified: Secondary | ICD-10-CM | POA: Diagnosis not present

## 2020-12-18 LAB — COMPREHENSIVE METABOLIC PANEL
ALT: 20 U/L (ref 0–44)
AST: 18 U/L (ref 15–41)
Albumin: 3.1 g/dL — ABNORMAL LOW (ref 3.5–5.0)
Alkaline Phosphatase: 38 U/L (ref 38–126)
Anion gap: 12 (ref 5–15)
BUN: 65 mg/dL — ABNORMAL HIGH (ref 8–23)
CO2: 24 mmol/L (ref 22–32)
Calcium: 7.8 mg/dL — ABNORMAL LOW (ref 8.9–10.3)
Chloride: 102 mmol/L (ref 98–111)
Creatinine, Ser: 4.51 mg/dL — ABNORMAL HIGH (ref 0.44–1.00)
GFR, Estimated: 9 mL/min — ABNORMAL LOW (ref >60)
Glucose, Bld: 75 mg/dL (ref 70–99)
Potassium: 4.2 mmol/L (ref 3.5–5.1)
Sodium: 138 mmol/L (ref 135–145)
Total Bilirubin: 1.1 mg/dL (ref 0.3–1.2)
Total Protein: 5.2 g/dL — ABNORMAL LOW (ref 6.5–8.1)

## 2020-12-18 LAB — GLUCOSE, CAPILLARY
Glucose-Capillary: 78 mg/dL (ref 70–99)
Glucose-Capillary: 161 mg/dL — ABNORMAL HIGH (ref 70–99)
Glucose-Capillary: 74 mg/dL (ref 70–99)
Glucose-Capillary: 108 mg/dL — ABNORMAL HIGH (ref 70–99)
Glucose-Capillary: 126 mg/dL — ABNORMAL HIGH (ref 70–99)
Glucose-Capillary: 177 mg/dL — ABNORMAL HIGH (ref 70–99)

## 2020-12-18 LAB — CBC
HCT: 23.2 % — ABNORMAL LOW (ref 36.0–46.0)
Hemoglobin: 7.9 g/dL — ABNORMAL LOW (ref 12.0–15.0)
MCH: 31.9 pg (ref 26.0–34.0)
MCHC: 34.1 g/dL (ref 30.0–36.0)
MCV: 93.5 fL (ref 80.0–100.0)
Platelets: 191 10*3/uL (ref 150–400)
RBC: 2.48 MIL/uL — ABNORMAL LOW (ref 3.87–5.11)
RDW: 13.9 % (ref 11.5–15.5)
WBC: 5.4 10*3/uL (ref 4.0–10.5)
nRBC: 0 % (ref 0.0–0.2)

## 2020-12-18 LAB — TROPONIN I (HIGH SENSITIVITY): Troponin I (High Sensitivity): 221 ng/L (ref <18)

## 2020-12-18 MED ORDER — SODIUM CHLORIDE 0.9 % IV SOLN
1.0000 mg/h | INTRAVENOUS | Status: DC
  Administered 2020-12-18: 1 mg/h via INTRAVENOUS
  Administered 2020-12-19: 2 mg/h via INTRAVENOUS
  Administered 2020-12-19: 1 mg/h via INTRAVENOUS
  Filled 2020-12-18: qty 5

## 2020-12-18 MED ORDER — ONDANSETRON HCL 4 MG/2ML IJ SOLN
4.0000 mg | Freq: Four times a day (QID) | INTRAMUSCULAR | Status: DC | PRN
  Administered 2020-12-18: 4 mg via INTRAVENOUS
  Filled 2020-12-18: qty 2

## 2020-12-18 MED ORDER — SODIUM CHLORIDE 0.9 % IV SOLN: INTRAVENOUS | Status: DC

## 2020-12-18 MED ORDER — HYDROMORPHONE HCL 1 MG/ML IJ SOLN: 2.0000 mg | Freq: Once | INTRAMUSCULAR | Status: DC

## 2020-12-18 MED ORDER — HYDROMORPHONE BOLUS VIA INFUSION
1.0000 mg | INTRAVENOUS | Status: DC | PRN
  Filled 2020-12-18: qty 1

## 2020-12-18 MED ORDER — LORAZEPAM 2 MG/ML IJ SOLN
2.0000 mg | Freq: Once | INTRAMUSCULAR | Status: AC
  Administered 2020-12-19: 2 mg via INTRAVENOUS

## 2020-12-18 MED ORDER — FUROSEMIDE 10 MG/ML IJ SOLN
40.0000 mg | Freq: Once | INTRAMUSCULAR | Status: AC
  Administered 2020-12-18: 40 mg via INTRAVENOUS
  Filled 2020-12-18: qty 4

## 2020-12-18 MED ORDER — LORAZEPAM 2 MG/ML IJ SOLN
1.0000 mg | Freq: Four times a day (QID) | INTRAMUSCULAR | Status: DC
  Administered 2020-12-19 (×2): 1 mg via INTRAVENOUS
  Filled 2020-12-18 (×3): qty 1

## 2020-12-18 MED ORDER — GLYCOPYRROLATE 0.2 MG/ML IJ SOLN
0.4000 mg | Freq: Three times a day (TID) | INTRAMUSCULAR | Status: DC
  Administered 2020-12-18 – 2020-12-19 (×2): 0.4 mg via INTRAVENOUS
  Filled 2020-12-18 (×2): qty 2

## 2020-12-18 MED ORDER — IPRATROPIUM-ALBUTEROL 0.5-2.5 (3) MG/3ML IN SOLN
3.0000 mL | Freq: Four times a day (QID) | RESPIRATORY_TRACT | Status: DC | PRN
  Administered 2020-12-18: 3 mL via RESPIRATORY_TRACT
  Filled 2020-12-18: qty 3

## 2020-12-18 MED ORDER — ONDANSETRON HCL 4 MG/2ML IJ SOLN: 4.0000 mg | Freq: Four times a day (QID) | INTRAMUSCULAR | Status: DC | PRN

## 2020-12-18 MED ORDER — BISACODYL 10 MG RE SUPP: 10.0000 mg | Freq: Every day | RECTAL | Status: DC | PRN

## 2020-12-18 NOTE — Progress Notes (Signed)
Pt CBG at 0320 was 78 ,RN initiate hypoglycemic protocol. Pt was given juice CBG was repeated in  15 min's CBG is now 74, pt was given ginger ale CBG will be repeated again in 15 min's.

## 2020-12-18 NOTE — Consult Note (Signed)
Palliative Medicine Inpatient Consult Note  Reason for consult:  End of Life Care "goals of care, patient requesting for end of life care, wants to be comfortable, doe snot want to suffer any more. 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  Patient had an aspiration event today and the decision was made by her family to pursue comfort focused care.  Clinical Assessment/Goals of Care:  *Please note that this is a verbal dictation therefore any spelling or grammatical errors are due to the "[place]" system interpretation.  I have reviewed medical records including EPIC notes, labs and imaging, received report from bedside RN, assessed the patient who was lying in bed and tachyneic.    I met with Ladaija, her husband and two daughters  to further discuss diagnosis prognosis, GOC, EOL wishes, disposition and options.   I introduced Palliative Medicine as specialized medical care for people living with serious illness. It focuses on providing relief from the symptoms and stress of a serious illness. The goal is to improve quality of life for both the patient and the family.  Lakecia is from [place], [place]. She has lived in [place] her whole life. She is married. She has been with her husband Herbie [place] for over 31 years. They share three daughters one of whom passed away in the last year. She is noted to have loved working in her garden and was a very active woman. She is faithful and practices within the [employer] denomination.   Prior to hospitalization Machaela had been living at home  with her husband. She has had a decreased functional state for almost two months now after being infected by COVID-19. She has struggled and been unable to resutnr to baseline.  We reviewed Lynsi comorbid conditions inclusive of her CKD IV and CHF with EF of 20-25%. We reviewed her physical and nutritional declines over the past two months which have been rather marked.   Concepts specific to code status, artifical feeding and hydration, continued IV antibiotics and rehospitalization was had. Patient is very clear about being DNAR/DNI.    The difference between a aggressive medical intervention path  and a palliative comfort care path for this patient at this time was had.We talked about transition to comfort measures in house and what that would entail inclusive of medications to control pain, dyspnea, agitation, nausea, itching, and hiccups.  We discussed stopping all uneccessary measures such as blood draws, needle sticks, and frequent vital signs. Utilized reflective listening throughout our time together as this is clearly very difficult for her family.   Offered chaplain support this evening.   Patient and her family have decided to pursue comfort focused care. Patient states that she has been suffering and does not wish to proceed this way any longer.  Discussed the importance of continued conversation with family and their  medical providers regarding overall plan of care and treatment options, ensuring decisions are within the context of the patients values and GOCs.  SUMMARY OF RECOMMENDATIONS   DNAR/DNI  Comfort focused care  Medications per [facility]  Unrestricted visitation  Appreciate Chaplain support  Ongoing PMT support  Code Status/Advance Care Planning: DNAR/DNI  Palliative Prophylaxis:   Oral Care, Pain, Constipation  Additional Recommendations (Limitations, Scope, Preferences):  Comfort focused care   Psycho-social/Spiritual:   Desire for further Chaplaincy  support: Yes - [employer]  Additional Recommendations: Education on end of life   Prognosis: Poor - limited to days  Discharge Planning: Discharge will be celetial  Vitals:   12/18/20 0349 12/18/20 1353  BP: 137/70 (!) 143/56  Pulse: 88 88  Resp: 17 20  Temp: 97.9 F (36.6 C) (!) 97.4 F (36.3 C)  SpO2: 95% 97%    Intake/Output Summary (Last 24 hours) at 12/18/2020 1728 Last data filed at 12/18/2020 0600 Gross per 24 hour  Intake 1743.33 ml  Output 200 ml  Net 1543.33 ml   Gen:  Frail elderly F in respiratory distress HEENT: dry mucous membranes CV: Regular rate and rhythm, no murmurs rubs or gallops PULM: On 4LPM [place] ABD: soft/nontender  EXT: No edema  Neuro: Oriented to person, time, place, and situation  PPS: 10%   This conversation/these recommendations were discussed with patient primary care team, Dr. Karleen Hampshire  Time In: 1720 Time Out: 1830 Total Time: 70 Greater than 50%  of this time was spent counseling and coordinating care related to the above assessment and plan.  [facility] Team Team Cell Phone: [phone] Please utilize secure chat with additional questions, if there is no response within 30 minutes please call the above phone number  Palliative Medicine Team providers are available by phone from 7am to 7pm daily and can be reached through the team cell phone.  Should this patient require assistance outside of these hours, please call the patient's attending physician.

## 2020-12-18 NOTE — Progress Notes (Signed)
[facility] NEPHROLOGY PROGRESS NOTE  Assessment/ Plan: Pt is a 80 y.o. yo female  with past medical history of DM, HTN, hypothyroidism, CKD stage IV baseline creatinine level around 3.0 (Dr Brenda Patterson at [facility]), recent diagnosis of COVID-19 infection presented with generalized weakness, constipation, decreased oral intake, seen as a consultation for AKI on CKD.  #Acute kidney injury on CKD stage IV likely hemodynamically mediated in the setting of decreased oral intake/dehydration with recent COVID-19 infection.  She looks dry on physical exam.  UA unremarkable and CT scan of abdomen pelvis without any hydronephrosis.  Urine output started only 200 cc therefore we will do bladder scan.  Creatinine level downtrending to 4.5.  Still looks dry and not eating much therefore we will continue gentle IV hydration.  No need for dialysis.  Strict ins and outs, daily lab. Continue to monitor closely.  #Acute blood loss anemia, GI bleed: Symptomatic on admission.  Received blood transfusion.  CT scan with fecal impaction.  Recommend to avoid phosphorus or magnesium containing anemia.  Iron saturation 16%.  I ordered dose of iron and Aranesp on 2/11.  #Generalized weakness/fatigue: With recent Covid infection and anemia.  Continue PT OT eval and supportive treatment.  #Recent Covid infection.  No pulmonary symptoms.  Off of isolation.  #Chronic CHF, EF of 20 to 25%.  Looks dry on exam as discussed above.  She will need cardiology follow-up.  Subjective: Seen and examined at bedside.  Feels weak but denies nausea, vomiting, chest pain, shortness of breath.  No new event. Objective Vital signs in last 24 hours: Vitals:   12/17/20 1618 12/17/20 2105 12/17/20 2354 12/18/20 0349  BP: 117/66 122/75 134/65 137/70  Pulse: 76 82 79 88  Resp: 16 17 16 17   Temp: 97.7 F (36.5 C) 98.2 F (36.8 C) 97.9 F (36.6 C) 97.9 F (36.6 C)  TempSrc:      SpO2:  96% 95% 95%   Weight change:    Intake/Output Summary (Last 24 hours) at 12/18/2020 0845 Last data filed at 12/18/2020 0600 Gross per 24 hour  Intake 1743.33 ml  Output 200 ml  Net 1543.33 ml       Labs: Basic Metabolic Panel: Recent Labs  Lab 12/16/20 0446 12/17/20 0319 12/17/20 1622 12/18/20 0341  NA 138 138  --  138  K 4.2 4.3  --  4.2  CL 101 101  --  102  CO2 24 26  --  24  GLUCOSE 112* 111*  --  75  BUN 71* 71*  --  65*  CREATININE 4.99* 4.93*  --  4.51*  CALCIUM 8.0* 8.0*  --  7.8*  PHOS  --   --  5.8*  --    Liver Function Tests: Recent Labs  Lab 01/01/2021 1750 12/16/20 0446 12/18/20 0341  AST 24 23 18   ALT 23 22 20   ALKPHOS 45 44 38  BILITOT 0.7 0.8 1.1  PROT 5.0* 4.9* 5.2*  ALBUMIN 2.4* 2.3* 3.1*   No results for input(s): LIPASE, AMYLASE in the last 168 hours. No results for input(s): AMMONIA in the last 168 hours. CBC: Recent Labs  Lab 12/29/2020 1750 12/16/20 0446 12/17/20 0319 12/18/20 0341  WBC 5.5 5.7 6.4 5.4  NEUTROABS 3.7  --   --   --   HGB 7.5* 8.8* 8.9* 7.9*  HCT 23.8* 26.9* 27.8* 23.2*  MCV 96.4 93.1 94.9 93.5  PLT 224 216 228 191   Cardiac Enzymes: No results for input(s): CKTOTAL, CKMB,  CKMBINDEX, TROPONINI in the last 168 hours. CBG: Recent Labs  Lab 12/17/20 2355 12/18/20 0320 12/18/20 0346 12/18/20 0416 12/18/20 0744  GLUCAP 83 78 74 161* 108*    Iron Studies:  Recent Labs    12/11/2020 2026  IRON 39  TIBC 239*  FERRITIN 176   Studies/Results: No results found.  Medications: Infusions: . sodium chloride 100 mL/hr at 12/18/20 0600    Scheduled Medications: . carvedilol  6.25 mg Oral BID WC  . insulin aspart  0-6 Units Subcutaneous Q4H  . isosorbide mononitrate  30 mg Oral Daily  . levothyroxine  25 mcg Oral QAC breakfast  . pantoprazole (PROTONIX) IV  40 mg Intravenous Q12H  . rosuvastatin  20 mg Oral Daily  . senna-docusate  2 tablet Oral BID  . sertraline  50 mg Oral Daily    have reviewed scheduled and prn  medications.  Physical Exam: General:NAD, comfortable Heart:RRR, s1s2 nl Lungs:clear b/l, no crackle Abdomen:soft, Non-tender, non-distended Extremities:No edema Neurology: Alert awake and following commands.  Brenda Patterson 12/18/2020,8:45 AM  LOS: 3 days

## 2020-12-18 NOTE — Progress Notes (Signed)
PT Cancellation Note  Patient Details Name: Brenda Patterson MRN: [id number] DOB: 17-Aug-1941   Cancelled Treatment:    Reason Eval/Treat Not Completed: Medical issues which prohibited therapy per RN patient not doing well medically this afternoon, also with complex psychosocial history over the past year. Will plan to check back tomorrow to potentially initiate eval vs sign off pending family and medical team decision.    Windell Norfolk, DPT, PN1   Supplemental Physical Therapist [facility]    Pager [phone] Acute Rehab Office [phone]

## 2020-12-18 NOTE — Progress Notes (Signed)
   12/18/20 1800  Clinical Encounter Type  Visited With Patient and family together  Visit Type Patient actively dying  Referral From Nurse  Consult/Referral To Chaplain  Spiritual Encounters  Spiritual Needs Prayer;Emotional;Grief support  The chaplain responded to page to support the family of the patient. The family requested prayer. The chaplain prayed for the family at bedside. The chaplain provided grief and emotional support. The chaplain will follow up as needed.

## 2020-12-18 NOTE — Progress Notes (Signed)
CBG is 161

## 2020-12-18 NOTE — Progress Notes (Signed)
Went in to check effectiveness of ativan.  Patient wheezing with increased WOB.   Called MD. CXR ordered as well as neb.  Given.  Zofran given for nausea.  Placed on [place] for comfort. Sats 97%. Respiratory came to bedside for assessment. Spoke to daughter who expresses that patient be moved to hospice house. See vitals. Patient arouses to voice at this time.

## 2020-12-18 NOTE — Progress Notes (Signed)
PROGRESS NOTE    Brenda Patterson  BPZ:[id number] DOB: 28-Oct-1941 DOA: 12/25/2020 PCP: [name], MD    No chief complaint on file.   Brief Narrative:  80 year old lady prior history of hypertension, hypothyroidism, type 2 diabetes, recently diagnosed with COVID-19 infection with resultant myopericarditis, not a candidate for any cardiac catheterization due to worsening renal parameters, discharged on November 20, 2020 presents today for generalized weakness and constipation. On arrival to ED labs show hemoglobin of 7.5 which have dropped from 9.1 last month. Stool for occult blood is positive. Creatinine worsened from 3.3 on discharge last month to 5.1 on admission. CT of the abdomen pelvis shows fecal impaction and bilateral pleural effusions. Patient was admitted for evaluation of GI bleed, symptomatic anemia, AKI, and significant constipation. GI consulted, recommended fecal disimpaction and enema. Pt underwent disimpaction yesterday and multiple bowel movements. Pt seen and examined this am, she appears to have sob, crackles at bases.    Assessment & Plan:   Principal Problem:   Acute GI bleeding Active Problems:   Type 2 diabetes mellitus, controlled ([facility])   Essential hypertension   ARF (acute renal failure) (HCC)   Symptomatic anemia   Acute respiratory failure with hypoxia requiring up to 4lit of [place] oxygen to keep sats greater than 90%: ? Fluid overload. Will get CXR, on exam she had wheezing posteriorly and fine crackles.  Will stop the fluids.  Will probably need IV lasix for diuresis.    Acute GI bleed/symptomatic anemia Drop of hemoglobin from 9-7.5. 1 unit of PRBC ordered.repeat hemoglobin post transfusion is around 8.9 dropped to 7.9this am. Continue to monitor. Stool for occult blood is positive. GI recommends outpatient work up. She is currently not stable for discharge.  Transfuse to keep hemoglobin greater than 7. Anemia panel shows adequate ferritin  and iron levels.  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 Pt has bilateral effusions in the lower lobes. Due to her history of cardiomyopathy, avoid fluid overload.  Nephrology consulted for recommendations.     Generalized weakness Probably secondary to symptomatic anemia. PT evaluation will be ordered .   Cardiomyopathy Post Covid infection, echo shows left ventricular ejection fraction of 20 to 25%, possible myopericarditis. Continue with the home medications at this time. Patient remains on room air with good oxygen saturations at this time.     Hypothyroidism Continue with Synthroid.   Type 2 diabetes mellitus Continue sliding scale insulin at this time. CBG (last 3)  Recent Labs    12/18/20 0416 12/18/20 0744 12/18/20 1134  GLUCAP 161* 108* 126*    No change in medications.    History of recent Covid infection She was diagnosed with Covid on January 10. Out of isolation .   Fecal impaction Disimpaction done. Multiple bowel movements today.    Hypertension Blood pressure parameters appear to be normal.   Failure to thrive:  Pt requesting for end of life care. Requesting to be comfortable, without any interventions.  Discussed with daughter over the phone. Requested palliative care consult for goals of discussions with the patient and family members.    DVT prophylaxis: SCD'S Code Status: (Full Code) Family Communication: none at bedside. Discussed the plan with her daughter over the phone.  Disposition:   Status is: Inpatient  Remains inpatient appropriate because:Ongoing diagnostic testing needed not appropriate for outpatient work up and Unsafe d/c  plan   Dispo: The patient is from: Home              Anticipated d/c is to: pending              Anticipated d/c date is: 2 days              Patient currently is not  medically stable to d/c.   Difficult to place patient No       Level of care: Progressive Consultants:   Gastroenterology.   Nephrology.    Procedures: none.    Antimicrobials: none.    Subjective: No chest pain or sob, no nausea, vomiting or abd pain.  Objective: Vitals:   12/17/20 1618 12/17/20 2105 12/17/20 2354 12/18/20 0349  BP: 117/66 122/75 134/65 137/70  Pulse: 76 82 79 88  Resp: 16 17 16 17   Temp: 97.7 F (36.5 C) 98.2 F (36.8 C) 97.9 F (36.6 C) 97.9 F (36.6 C)  TempSrc:      SpO2:  96% 95% 95%    Intake/Output Summary (Last 24 hours) at 12/18/2020 1309 Last data filed at 12/18/2020 0600 Gross per 24 hour  Intake 1743.33 ml  Output 200 ml  Net 1543.33 ml   There were no vitals filed for this visit.  Examination:  General exam: Ill-appearing lady, on 4 L of nasal cannula oxygen. Respiratory system: Crackles at bases, diminished air entry, tachypneic on 4 L of nasal cannula oxygen. Cardiovascular system: S1-S2 heard, regular rate rhythm, no JVD Gastrointestinal system: Abdomen is soft, nontender, nondistended bowel sounds normal Central nervous system: Alert and able to answer all questions Extremities: No pedal edema Skin: No rashes seen Psychiatry: .  Flat affect   Data Reviewed: I have personally reviewed following labs and imaging studies  CBC: Recent Labs  Lab 12/21/2020 1750 12/16/20 0446 12/17/20 0319 12/18/20 0341  WBC 5.5 5.7 6.4 5.4  NEUTROABS 3.7  --   --   --   HGB 7.5* 8.8* 8.9* 7.9*  HCT 23.8* 26.9* 27.8* 23.2*  MCV 96.4 93.1 94.9 93.5  PLT 224 216 228 350    Basic Metabolic Panel: Recent Labs  Lab 12/13/2020 1750 12/16/20 0446 12/17/20 0319 12/17/20 1622 12/18/20 0341  NA 136 138 138  --  138  K 4.2 4.2 4.3  --  4.2  CL 100 101 101  --  102  CO2 23 24 26   --  24  GLUCOSE 144* 112* 111*  --  75  BUN 72* 71* 71*  --  65*  CREATININE 5.16* 4.99* 4.93*  --  4.51*  CALCIUM 7.9* 8.0* 8.0*  --  7.8*  MG 2.2  --    --  2.3  --   PHOS  --   --   --  5.8*  --     GFR: CrCl cannot be calculated (Unknown ideal weight.).  Liver Function Tests: Recent Labs  Lab 12/28/2020 1750 12/16/20 0446 12/18/20 0341  AST 24 23 18   ALT 23 22 20   ALKPHOS 45 44 38  BILITOT 0.7 0.8 1.1  PROT 5.0* 4.9* 5.2*  ALBUMIN 2.4* 2.3* 3.1*    CBG: Recent Labs  Lab 12/18/20 0320 12/18/20 0346 12/18/20 0416 12/18/20 0744 12/18/20 1134  GLUCAP 78 74 161* 108* 126*     Recent Results (from the past 240 hour(s))  Urine culture     Status: Abnormal   Collection Time: 12/16/20  5:00 AM   Specimen: Urine, Random  Result Value Ref  Range Status   Specimen Description URINE, RANDOM  Final   Special Requests   Final    NONE Performed at [facility], [address], [place], [place] [address]    Culture MULTIPLE SPECIES PRESENT, SUGGEST RECOLLECTION (A)  Final   Report Status 12/17/2020 FINAL  Final         Radiology Studies: No results found.      Scheduled Meds: . carvedilol  6.25 mg Oral BID WC  . insulin aspart  0-6 Units Subcutaneous Q4H  . isosorbide mononitrate  30 mg Oral Daily  . levothyroxine  25 mcg Oral QAC breakfast  . pantoprazole (PROTONIX) IV  40 mg Intravenous Q12H  . rosuvastatin  20 mg Oral Daily  . senna-docusate  2 tablet Oral BID  . sertraline  50 mg Oral Daily   Continuous Infusions:    LOS: 3 days        Hosie Poisson, MD [facility]   To contact the attending provider between 7A-7P or the covering provider during after hours 7P-7A, please log into the web site [email or web address] and access using universal [place] password for that web site. If you do not have the password, please call the hospital operator.  12/18/2020, 1:09 PM

## 2020-12-18 NOTE — Progress Notes (Signed)
OT Cancellation Note  Patient Details Name: Brenda Patterson MRN: [id number] DOB: [birth year]   Cancelled Treatment:    Reason Eval/Treat Not Completed: Medical issues which prohibited therapy. Per RN patient not doing well medically this afternoon, also with complex psychosocial history over the past year. Will plan to check back tomorrow to potentially initiate eval vs sign off pending family and medical team decision.   Ramond Dial, OT/L   Acute OT Clinical Specialist Acute Rehabilitation Services Pager [phone] Office [phone]  12/18/2020, 3:03 PM

## 2020-12-19 DIAGNOSIS — K922 Gastrointestinal hemorrhage, unspecified: Secondary | ICD-10-CM | POA: Diagnosis not present

## 2020-12-19 DIAGNOSIS — Z7189 Other specified counseling: Secondary | ICD-10-CM | POA: Diagnosis not present

## 2020-12-19 DIAGNOSIS — Z515 Encounter for palliative care: Secondary | ICD-10-CM | POA: Diagnosis not present

## 2020-12-26 IMAGING — US US RENAL
1 series · 13 of 25 positions shown · non-contrast
Comparison: Prior CT from 07/08/2011.

CLINICAL DATA: Initial evaluation for chronic kidney disease with
declining renal function.

EXAM:
RENAL / URINARY TRACT ULTRASOUND COMPLETE

[Series 1: us renal · 0.25mm/px · 13 of 46 slices shown]
[im 1/46]
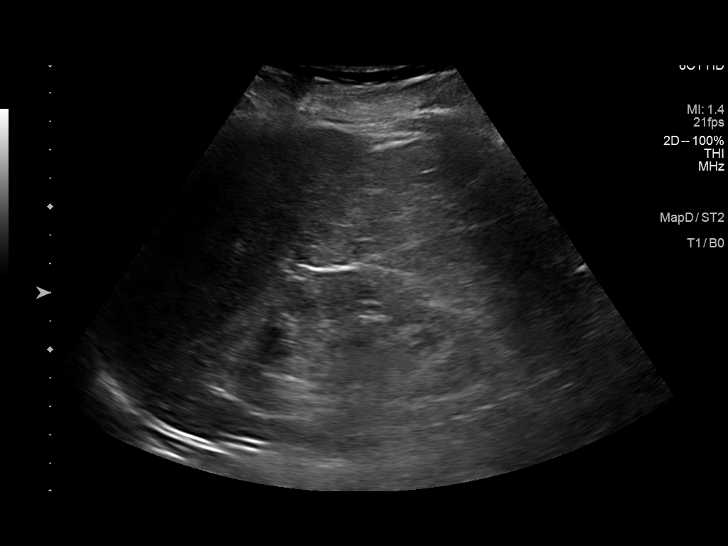
[im 4/46]
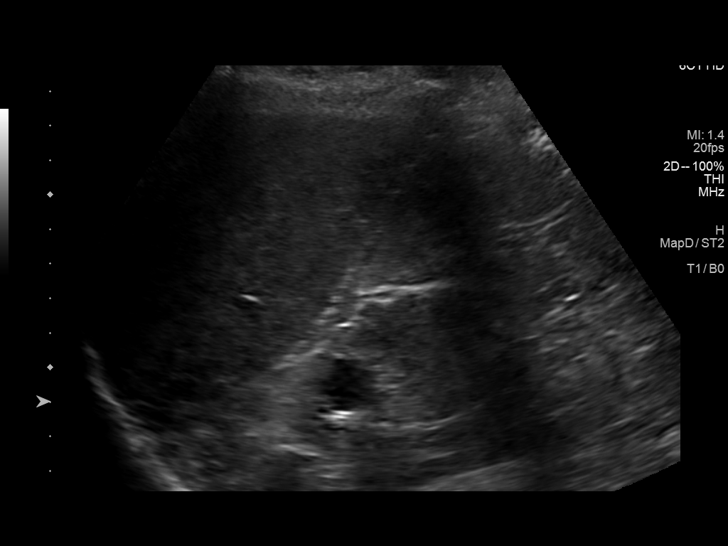
[im 8/46]
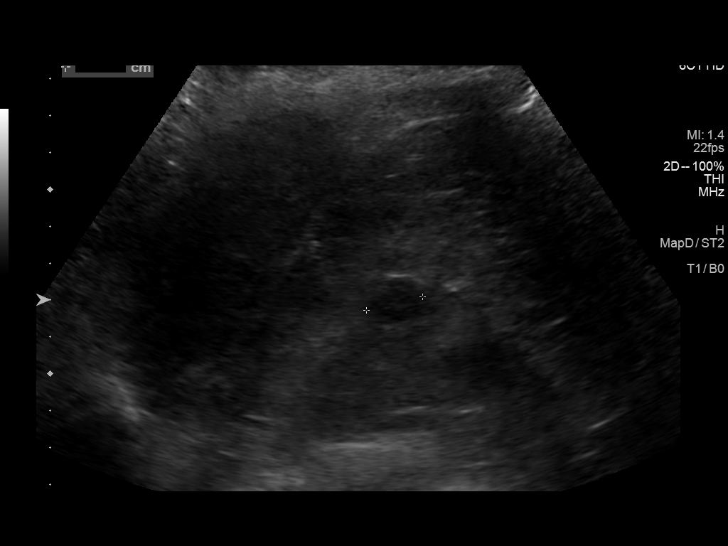
[im 12/46]
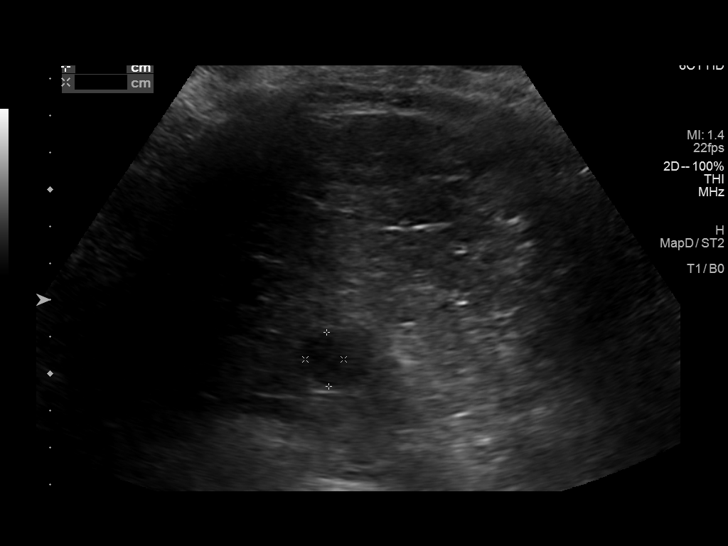
[im 16/46]
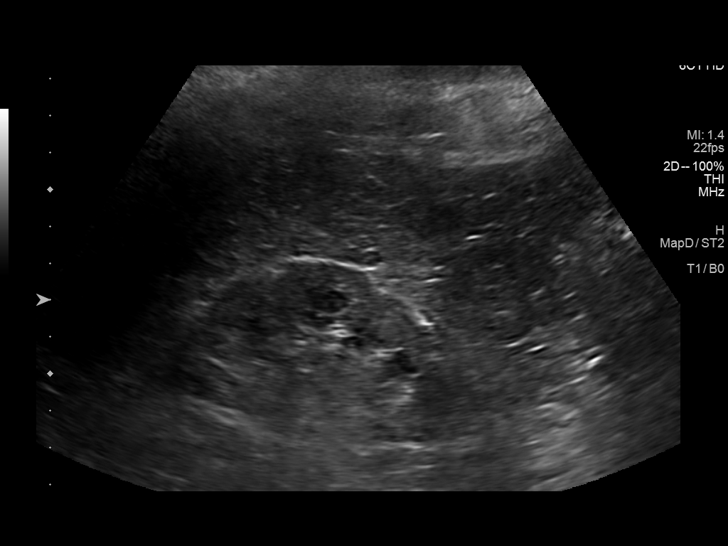
[im 19/46]
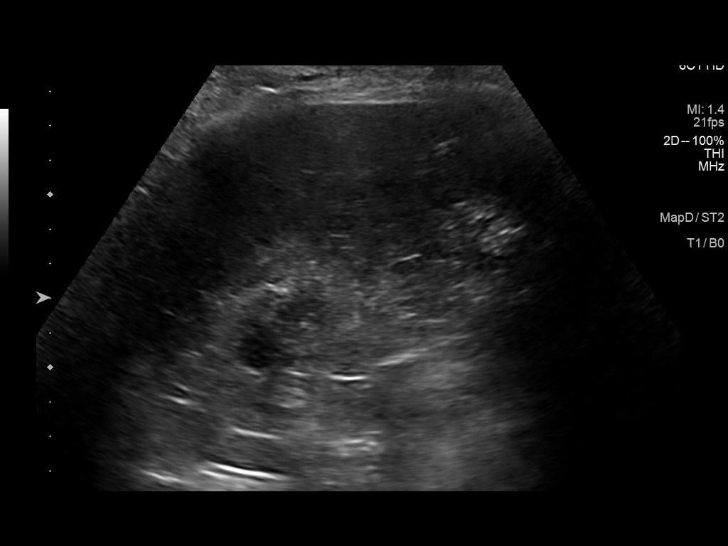
[im 23/46]
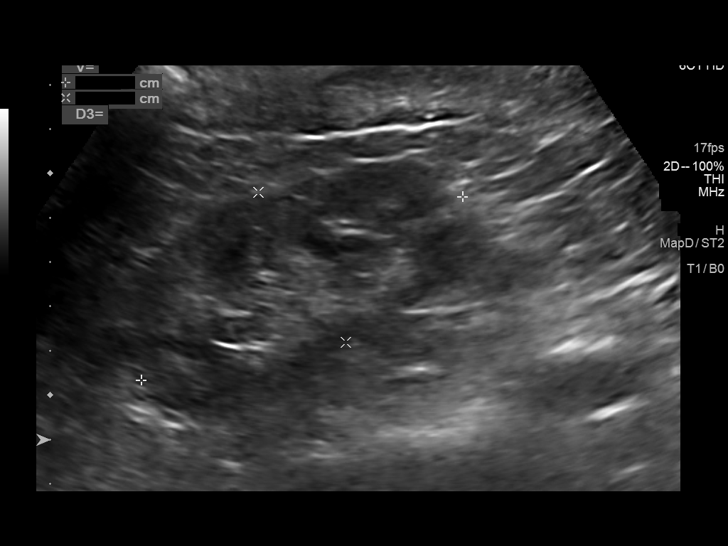
[im 27/46]
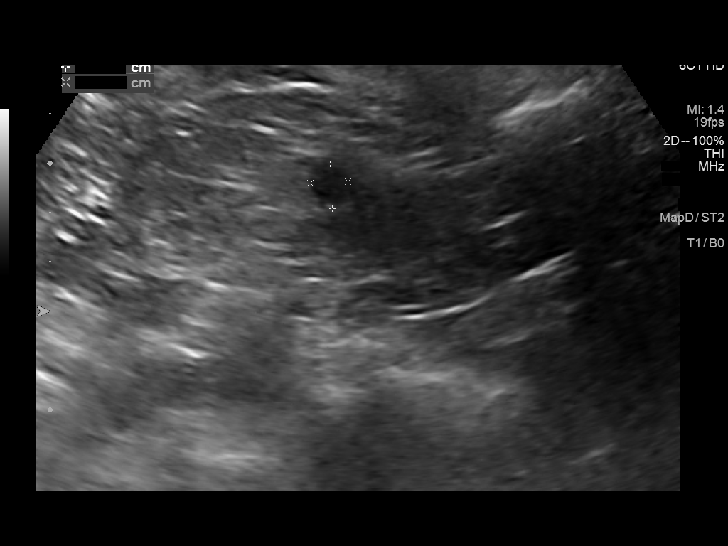
[im 31/46]
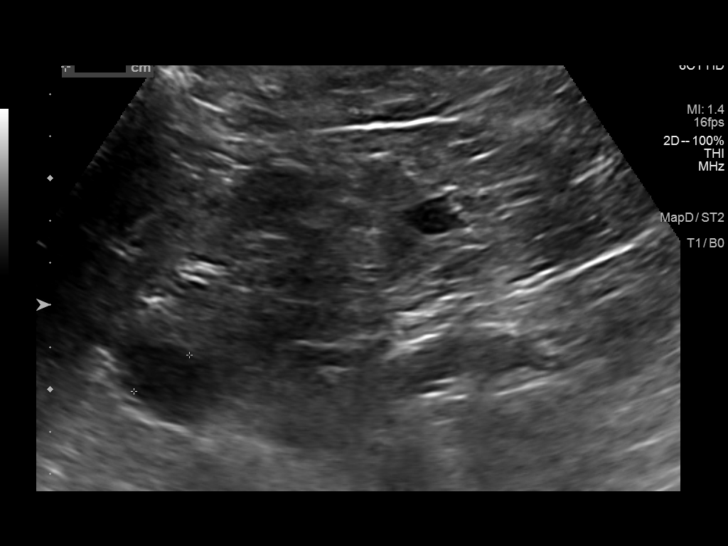
[im 34/46]
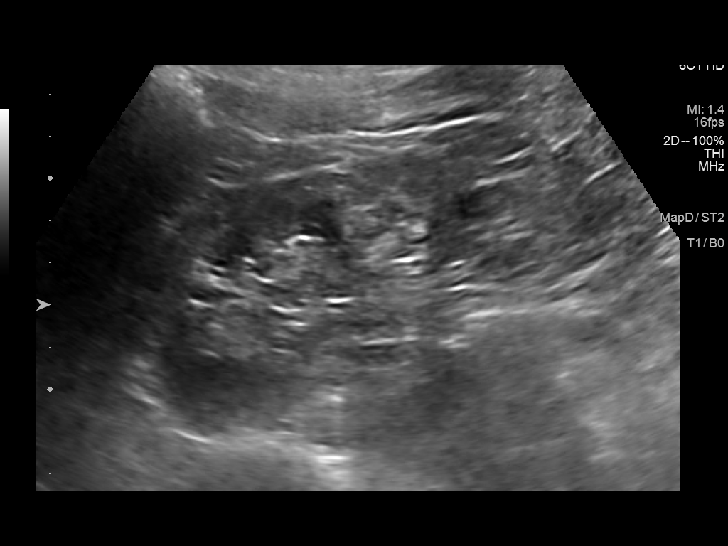
[im 38/46]
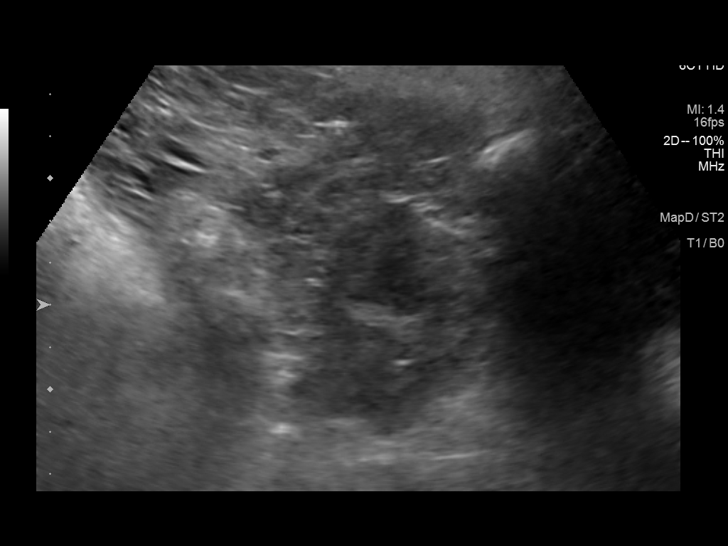
[im 42/46]
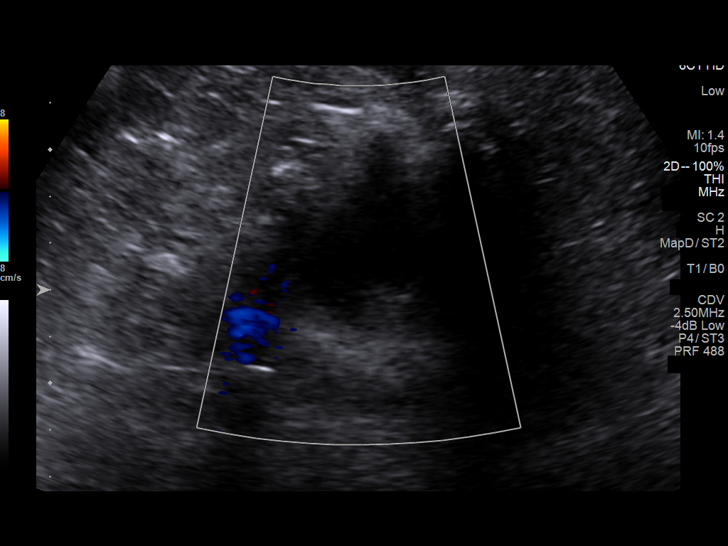
[im 46/46]
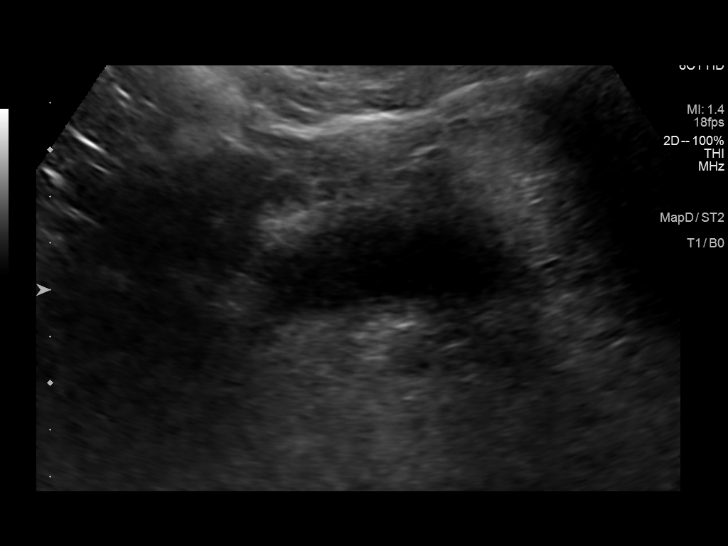

[13 of 25 positions shown; findings below may reference images not displayed]

FINDINGS: Right Kidney:

Renal measurements: 9.1 x 4.7 x 3.8 cm = volume: 83 mL. Increased
echogenicity within the renal parenchyma with poor corticomedullary
differentiation. No nephrolithiasis or hydronephrosis. 1.5 x 1.5 x
1.2 cm hypoechoic benign appearing cyst present at the upper pole.
Additional 1.6 x 1.3 x 1.8 cm hypoechoic benign appearing cyst noted
at the interpolar region as well.

Left Kidney:

Renal measurements: 8.3 x 3.9 x 3.7 cm = volume: 63 mL. Increased
echogenicity within the renal parenchyma with poor corticomedullary
differentiation. No nephrolithiasis or hydronephrosis. 1.1 x 0.9 x
0.8 cm benign-appearing hypoechoic cyst present at the lower pole.
1.6 x 1.3 x 1.6 cm benign-appearing hypoechoic cyst present at the
upper pole.

Bladder:

Appears normal for degree of bladder distention.

Other:

None.
IMPRESSION: 1. Increased echogenicity within the renal parenchyma, consistent
with medical renal disease.
2. No hydronephrosis or other acute abnormality.
3. Multiple bilateral benign appearing renal cysts as detailed
above.

## 2021-01-04 NOTE — Plan of Care (Signed)
  Problem: Education: Goal: Knowledge of General Education information will improve Description: Including pain rating scale, medication(s)/side effects and non-pharmacologic comfort measures Outcome: Not Progressing   Problem: Health Behavior/Discharge Planning: Goal: Ability to manage health-related needs will improve Outcome: Not Progressing   Problem: Clinical Measurements: Goal: Ability to maintain clinical measurements within normal limits will improve Outcome: Not Progressing Goal: Will remain free from infection Outcome: Not Progressing Goal: Diagnostic test results will improve Outcome: Not Progressing Goal: Respiratory complications will improve Outcome: Not Progressing Goal: Cardiovascular complication will be avoided Outcome: Not Progressing   Problem: Activity: Goal: Risk for activity intolerance will decrease Outcome: Not Progressing   Problem: Nutrition: Goal: Adequate nutrition will be maintained Outcome: Not Progressing   Problem: Coping: Goal: Level of anxiety will decrease Outcome: Not Progressing   Problem: Elimination: Goal: Will not experience complications related to bowel motility Outcome: Not Progressing Goal: Will not experience complications related to urinary retention Outcome: Not Progressing   Problem: Pain Managment: Goal: General experience of comfort will improve Outcome: Not Progressing   Problem: Safety: Goal: Ability to remain free from injury will improve Outcome: Not Progressing   Problem: Skin Integrity: Goal: Risk for impaired skin integrity will decrease Outcome: Not Progressing   Problem: Education: Goal: Knowledge of disease and its progression will improve Outcome: Not Progressing   Problem: Health Behavior/Discharge Planning: Goal: Ability to manage health-related needs will improve Outcome: Not Progressing   Problem: Clinical Measurements: Goal: Complications related to the disease process or treatment will be  avoided or minimized Outcome: Not Progressing Goal: Dialysis access will remain free of complications Outcome: Not Progressing   Problem: Activity: Goal: Activity intolerance will improve Outcome: Not Progressing   Problem: Fluid Volume: Goal: Fluid volume balance will be maintained or improved Outcome: Not Progressing   Problem: Nutritional: Goal: Ability to make appropriate dietary choices will improve Outcome: Not Progressing   Problem: Respiratory: Goal: Respiratory symptoms related to disease process will be avoided Outcome: Not Progressing   Problem: Self-Concept: Goal: Body image disturbance will be avoided or minimized Outcome: Not Progressing   Problem: Urinary Elimination: Goal: Progression of disease will be identified and treated Outcome: Not Progressing   Problem: Clinical Measurements: Goal: Will remain free from infection Outcome: Not Progressing

## 2021-01-04 NOTE — Discharge Summary (Signed)
Death Summary  Brenda Patterson RWE:[id number] DOB: 04/10/1941 DOA: 12-16-20  PCP: [name], MD  Admit date: 2020-12-16 Date of Death: 12-20-20 Time of Death: 36   History of present illness:  80 year old lady prior history of hypertension, hypothyroidism, type 2 diabetes, recently diagnosed with COVID-19 infection with resultant myopericarditis, not a candidate for any cardiac catheterization due to worsening renal parameters, discharged on November 20, 2020 presents today for generalized weakness and constipation. On arrival to ED labs show hemoglobin of 7.5 which have dropped from 9.1 last month. Stool for occult blood is positive. Creatinine worsened from 3.3 on discharge last month to 5.1 on admission. CT of the abdomen pelvis shows fecal impaction and bilateral pleural effusions. Patient was admitted for evaluation of GI bleed, symptomatic anemia, AKI, and significant constipation. GI consulted, recommended fecal disimpaction and enema. Pt underwent disimpaction and had  multiple bowel movements.    Acute respiratory failure with hypoxia requiring up to 4lit of [place] oxygen to keep sats greater than 90%: ? Fluid overload. CXR shows interstitial edema and CHF and possible pneumonia Stopped fluids, ordered IV lasix and IV unasyn. But after meeting with family /palliative care, transitioned to comfort measures.    Acute GI bleed/symptomatic anemia Drop of hemoglobin from 9-7.5. 1 unit of PRBC ordered.repeat hemoglobin post transfusion is around 8.9 dropped to 7.9this am. Continue to monitor. Stool for occult blood is positive. GI recommends outpatient work up.  Anemia panel shows adequate ferritin and iron levels.  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 Pt has bilateral effusions in the lower lobes. Due to her history of cardiomyopathy, avoid  fluid overload.      Generalized weakness Probably secondary to symptomatic anemia. PT evaluation will be ordered .   Cardiomyopathy Post Covid infection, echo shows left ventricular ejection fraction of 20 to 25%, possible myopericarditis. Continue with the home medications at this time. Patient remains on room air with good oxygen saturations at this time.     Hypothyroidism Continue with Synthroid.   Type 2 diabetes mellitus   History of recent Covid infection She was diagnosed with Covid on January 10. Out of isolation .   Fecal impaction Disimpaction done. Multiple bowel movements yesterday.    Hypertension   Failure to thrive:  Pt requesting for end of life care. Requesting to be comfortable, without any interventions.  Discussed with daughter over the phone. Requested palliative care consult for goals of discussions with the patient and family members.  She was transitioned to comfort measures after palliative discussions  She passed at 1530,   Final diagnosis.   Acute GI bleeding   Type 2 diabetes mellitus, controlled ([place])   Essential hypertension   ARF (acute renal failure) (HCC)   Symptomatic anemia    The results of significant diagnostics from this hospitalization (including imaging, microbiology, ancillary and laboratory) are listed below for reference.    Significant Diagnostic Studies: CT Abdomen Pelvis Wo Contrast  Result Date: 16-Dec-2020 CLINICAL DATA:  Acute nonlocalized abdominal pain. EXAM: CT ABDOMEN AND PELVIS WITHOUT CONTRAST TECHNIQUE: Multidetector CT imaging of the abdomen and pelvis was performed following the standard protocol without IV contrast. COMPARISON:  07/07/2011 FINDINGS: Lower chest: Bilateral effusions layering dependently. Cardiomegaly. Abnormal patchy pulmonary markings that could be a combination of chronic and acute lung disease, including the possibility acute edema and pneumonia. Hepatobiliary:  Previous cholecystectomy.  No focal liver parenchymal lesion. Pancreas: Normal Spleen: Normal Adrenals/Urinary Tract: Adrenal glands are normal. Kidneys are small. Small low-density areas in both kidneys, indeterminate by CT. Cysts shown by recent ultrasound. Stomach/Bowel: Stomach and small intestine are normal. No acute colon pathology. Fecal impaction in the rectum. Vascular/Lymphatic: Aortic atherosclerotic calcification. No aneurysm. IVC is normal. No adenopathy. Reproductive: No pelvic mass. Other: No free fluid or air. Musculoskeletal: No evidence of hernia. Chronic degenerative changes throughout the lumbar spine. Old minor superior endplate fracture at F75. IMPRESSION: 1. Fecal impaction in the rectum which could be symptomatic. 2. Bilateral pleural effusions layering dependently. Abnormal patchy pulmonary markings that could be a combination of chronic and acute lung disease, including the possibility of acute edema and patchy pneumonia. 3. Aortic atherosclerosis. 4. Small kidneys. Aortic Atherosclerosis (ICD10-I70.0). Electronically Signed   By: Nelson Chimes M.D.   On: 12/10/2020 20:48   DG CHEST PORT 1 VIEW  Result Date: 12/18/2020 CLINICAL DATA:  Respiratory distress EXAM: PORTABLE CHEST 1 VIEW COMPARISON:  December 15, 2020 FINDINGS: There is widespread airspace opacity throughout the lungs bilaterally, more severe on the right than on the left. There is interstitial edema. There is mild cardiomegaly with pulmonary venous hypertension. No adenopathy. There is aortic atherosclerosis. No bone lesions. IMPRESSION: Widespread airspace opacity throughout the lungs bilaterally, more severe on the right than on the left. There is cardiomegaly with pulmonary vascular congestion. There is also interstitial edema. The overall appearance is consistent with congestive heart failure. Superimposed pneumonia, particular on the right, cannot be excluded. Both edema and pneumonia may be present concurrently. Aortic  Atherosclerosis (ICD10-I70.0). Electronically Signed   By: Lowella Grip III M.D.   On: 12/18/2020 15:47   DG Chest Portable 1 View  Result Date: 12/11/2020 CLINICAL DATA:  80 year old female with weakness. EXAM: PORTABLE CHEST 1 VIEW COMPARISON:  Chest radiograph dated 04/14/2021. FINDINGS: Shallow inspiration with bibasilar atelectasis. The cardiomegaly with vascular congestion. No pleural effusion or pneumothorax. Faint right peripheral/subpleural haziness may represent atelectasis or scarring. Atypical infiltrate is less likely but not excluded clinical correlation recommended. Atherosclerotic calcification of the aorta. No acute osseous pathology. IMPRESSION: Cardiomegaly with vascular congestion.  No focal consolidation. Electronically Signed   By: Anner Crete M.D.   On: 12/18/2020 20:12    Microbiology: Recent Results (from the past 240 hour(s))  Urine culture     Status: Abnormal   Collection Time: 12/16/20  5:00 AM   Specimen: Urine, Random  Result Value Ref Range Status   Specimen Description URINE, RANDOM  Final   Special Requests   Final    NONE Performed at [facility], [address], [place], [place] [address]    Culture MULTIPLE SPECIES PRESENT, SUGGEST RECOLLECTION (A)  Final   Report Status 12/17/2020 FINAL  Final     Labs: Basic Metabolic Panel: Recent Labs  Lab 12/09/2020 1750 12/16/20 0446 12/17/20 0319 12/17/20 1622 12/18/20 0341  NA 136 138 138  --  138  K 4.2 4.2 4.3  --  4.2  CL 100 101 101  --  102  CO2 23 24 26   --  24  GLUCOSE 144* 112* 111*  --  75  BUN 72* 71* 71*  --  65*  CREATININE 5.16* 4.99* 4.93*  --  4.51*  CALCIUM 7.9* 8.0* 8.0*  --  7.8*  MG 2.2  --   --  2.3  --   PHOS  --   --   --  5.8*  --  Liver Function Tests: Recent Labs  Lab 01/01/2021 1750 12/16/20 0446 12/18/20 0341  AST 24 23 18   ALT 23 22 20   ALKPHOS 45 44 38  BILITOT 0.7 0.8 1.1  PROT 5.0* 4.9* 5.2*  ALBUMIN 2.4* 2.3* 3.1*   No results for  input(s): LIPASE, AMYLASE in the last 168 hours. No results for input(s): AMMONIA in the last 168 hours. CBC: Recent Labs  Lab 12/10/2020 1750 12/16/20 0446 12/17/20 0319 12/18/20 0341  WBC 5.5 5.7 6.4 5.4  NEUTROABS 3.7  --   --   --   HGB 7.5* 8.8* 8.9* 7.9*  HCT 23.8* 26.9* 27.8* 23.2*  MCV 96.4 93.1 94.9 93.5  PLT 224 216 228 191   Cardiac Enzymes: No results for input(s): CKTOTAL, CKMB, CKMBINDEX, TROPONINI in the last 168 hours. D-Dimer No results for input(s): DDIMER in the last 72 hours. BNP: Invalid input(s): POCBNP CBG: Recent Labs  Lab 12/18/20 0346 12/18/20 0416 12/18/20 0744 12/18/20 1134 12/18/20 1538  GLUCAP 74 161* 108* 126* 177*   Anemia work up No results for input(s): VITAMINB12, FOLATE, FERRITIN, TIBC, IRON, RETICCTPCT in the last 72 hours. Urinalysis    Component Value Date/Time   COLORURINE YELLOW 12/16/2020 0507   APPEARANCEUR CLEAR 12/16/2020 0507   LABSPEC 1.020 12/16/2020 0507   PHURINE 6.0 12/16/2020 0507   GLUCOSEU NEGATIVE 12/16/2020 0507   HGBUR MODERATE (A) 12/16/2020 0507   BILIRUBINUR NEGATIVE 12/16/2020 0507   BILIRUBINUR neg 02/15/2012 1523   KETONESUR NEGATIVE 12/16/2020 0507   PROTEINUR 30 (A) 12/16/2020 0507   UROBILINOGEN 0.2 11/21/2012 1035   NITRITE NEGATIVE 12/16/2020 0507   LEUKOCYTESUR SMALL (A) 12/16/2020 0507   Sepsis Labs Invalid input(s): PROCALCITONIN,  WBC,  LACTICIDVEN     SIGNED:  Hosie Poisson, MD  [facility] 12-21-20, 2:29 PM

## 2021-01-04 NOTE — Progress Notes (Signed)
Time of death 1350.  Family at bedside.  Completed organ donation form and post-mortem care. Charge nurse and MD notified.

## 2021-01-04 NOTE — Progress Notes (Addendum)
   Palliative Medicine Inpatient Follow Up Note  Reason for consult:  End of Life Care "goals of care, patient requesting for end of life care, wants to be comfortable, doe snot want to suffer any more. 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  Patient had an aspiration event today and the decision was made by her family to pursue comfort focused care.  Today's Discussion (12/18/2020):  *Please note that this is a verbal dictation therefore any spelling or grammatical errors are due to the "[place] [facility]" system interpretation.  Chart reviewed. I met with Brenda Patterson this morning. She was comfortable - non-responsive. Has (+) mottling of digits. Continues on dilaudid gtt. Per conversation with nursing staff has appeared comfortable throughout the night on dilaudid gtt and scheduled ativan. Has not produced any urine overnight - will order bladder scan and foley placement if retention is present.   Patients family do not want Christmas transferred and would prefer that she passes in house.   Questions and concerns addressed   Objective Assessment: Vital Signs Vitals:   12/18/20 1353 12/18/20 2200  BP: (!) 143/56 138/74  Pulse: 88 75  Resp: 20 19  Temp: (!) 97.4 F (36.3 C) 98.6 F (37 C)  SpO2: 97% 100%    Intake/Output Summary (Last 24 hours) at Jan 07, 2021 0756 Last data filed at 12/18/2020 1700 Gross per 24 hour  Intake 420 ml  Output 600 ml  Net -180 ml   Last Weight  Most recent update: 12/18/2020  5:58 PM   Weight  87.1 kg (192 lb 0.3 oz)           Gen:  Frail elderly F  HEENT: dry  mucous membranes CV: Regular rate and rhythm  PULM: On RA ABD: soft/nontender  EXT: No edema  Neuro: Comatose  SUMMARY OF RECOMMENDATIONS DNAR/DNI  Comfort focused care  Medications per [facility]  Unrestricted visitation  Bladder scan with foley placement if (+) retentions  Appreciate Chaplain support  Ongoing PMT support  Time Spent: 25 Greater than 50% of the time was spent in counseling and coordination of care ______________________________________________________________________________________ [place] Team Team Cell Phone: [phone] Please utilize secure chat with additional questions, if there is no response within 30 minutes please call the above phone number  Palliative Medicine Team providers are available by phone from 7am to 7pm daily and can be reached through the team cell phone.  Should this patient require assistance outside of these hours, please call the patient's attending physician.

## 2021-01-04 NOTE — Plan of Care (Signed)
Nephrology brief note: Noted patient is DNR and comfort measures.   Please call back if needed.  Discussed with primary team.   Katheran James, MD [facility]

## 2021-01-04 NOTE — Progress Notes (Signed)
Increased dilaudid gtt to 2mg /hour for agonal breathing.  Family at bedside.

## 2021-01-04 DEATH — deceased

## 2021-04-19 IMAGING — DX DG CHEST 1V PORT
1 series · 1 of 1 positions shown · non-contrast
Comparison: Chest radiograph dated 04/14/2021.

CLINICAL DATA: 79-year-old female with weakness.

EXAM:
PORTABLE CHEST 1 VIEW

[chest]
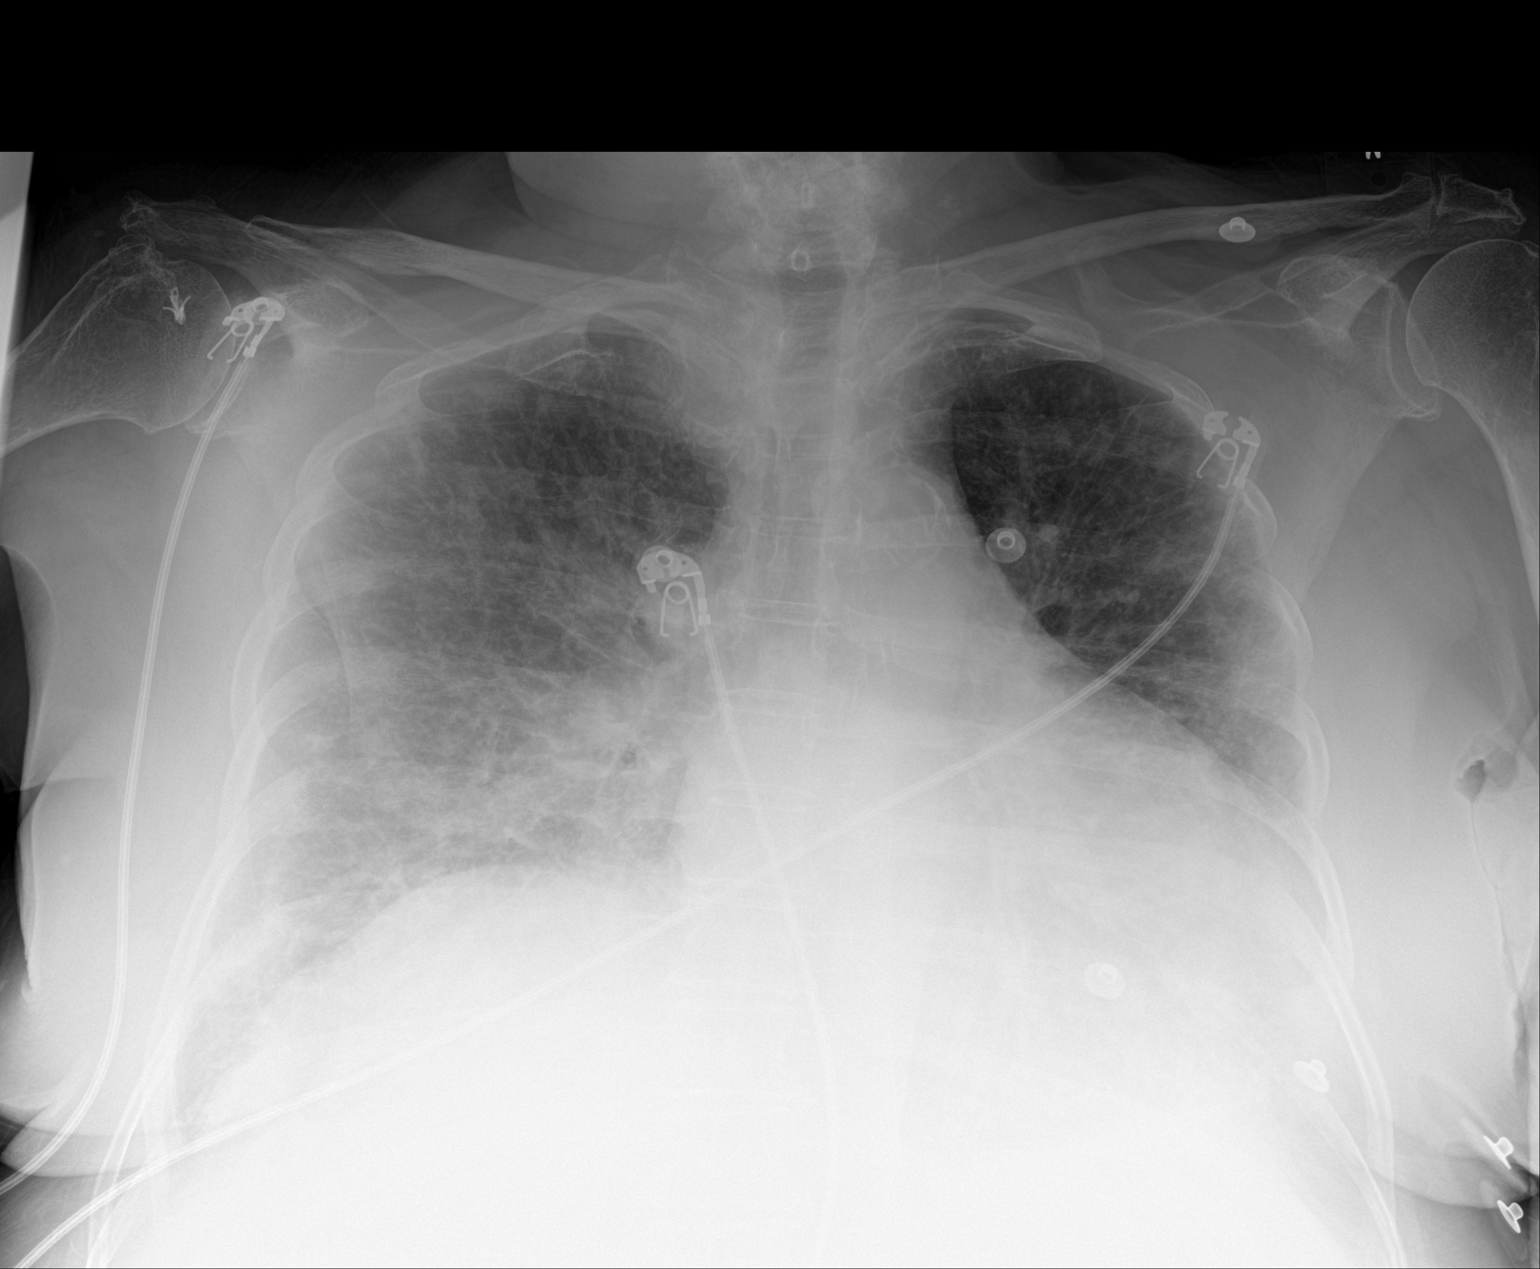

[1 of 1 positions shown; findings below may reference images not displayed]

FINDINGS: Shallow inspiration with bibasilar atelectasis. The cardiomegaly
with vascular congestion. No pleural effusion or pneumothorax. Faint
right peripheral/subpleural haziness may represent atelectasis or
scarring. Atypical infiltrate is less likely but not excluded
clinical correlation recommended. Atherosclerotic calcification of
the aorta. No acute osseous pathology.
IMPRESSION: Cardiomegaly with vascular congestion.  No focal consolidation.

## 2021-04-19 IMAGING — CT CT ABD-PELV W/O CM
2 of 4 series · 16 of 46 positions shown, 18 images · non-contrast
Comparison: 07/07/2011

CLINICAL DATA: Acute nonlocalized abdominal pain.

EXAM:
CT ABDOMEN AND PELVIS WITHOUT CONTRAST
TECHNIQUE: Multidetector CT imaging of the abdomen and pelvis was performed
following the standard protocol without IV contrast.

[Series 3: abd/ pelvis 5.0 i30f 2 · axial · 0.92mm/px · z∈[-946,-506]mm · 13 of 98 slices shown, 15 images]
[im 5/98  soft-tissue]
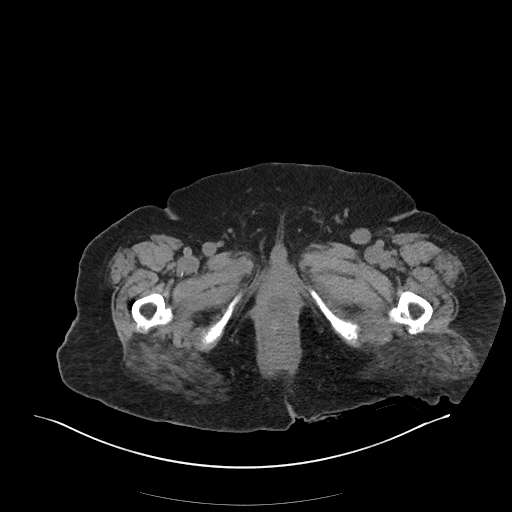
[im 5/98  bone]
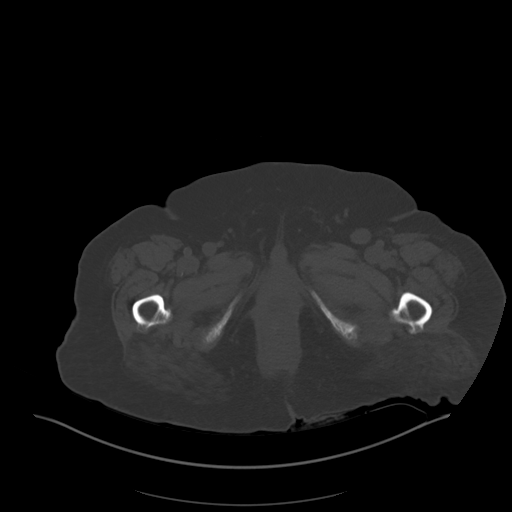
[im 13/98  soft-tissue]
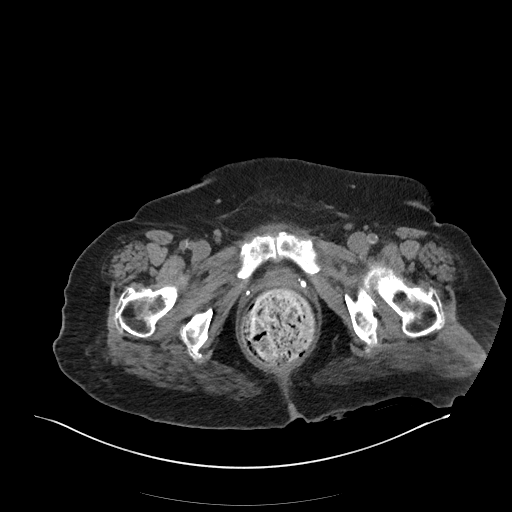
[im 22/98  soft-tissue]
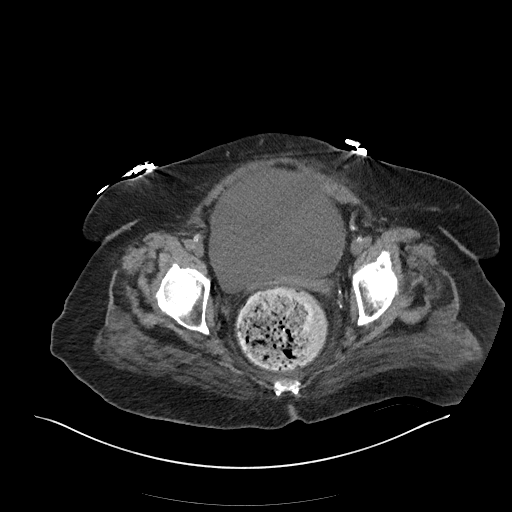
[im 26/98  soft-tissue]
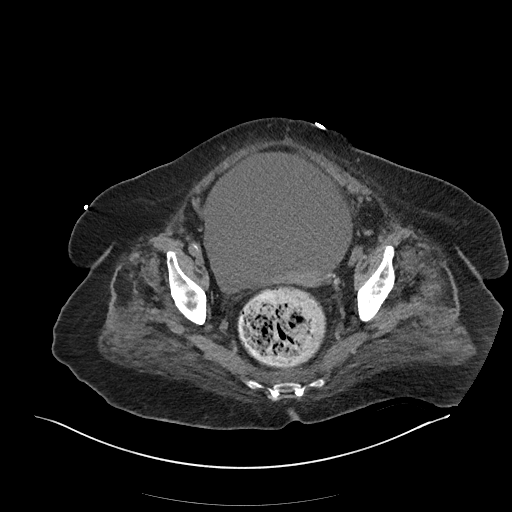
[im 34/98  soft-tissue]
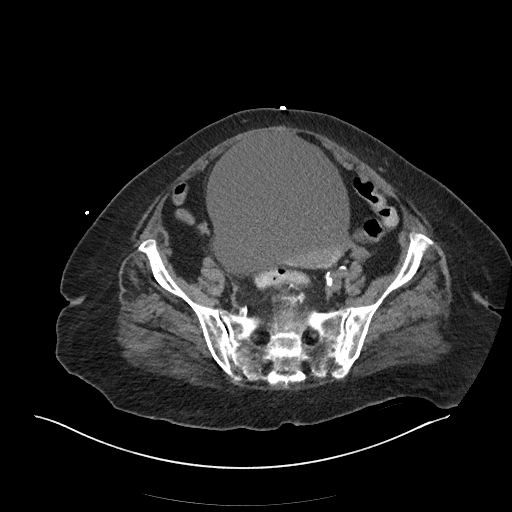
[im 43/98  soft-tissue]
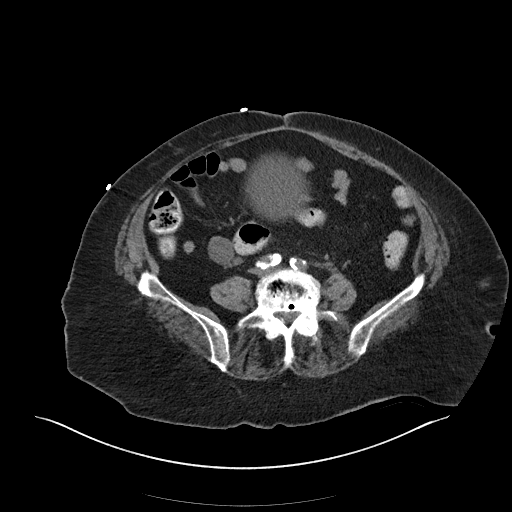
[im 51/98  soft-tissue]
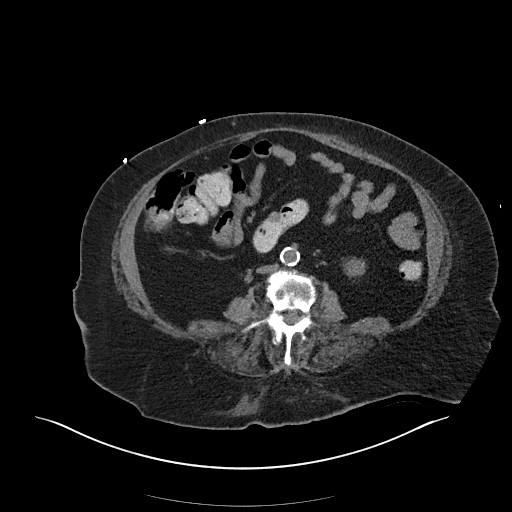
[im 55/98  soft-tissue]
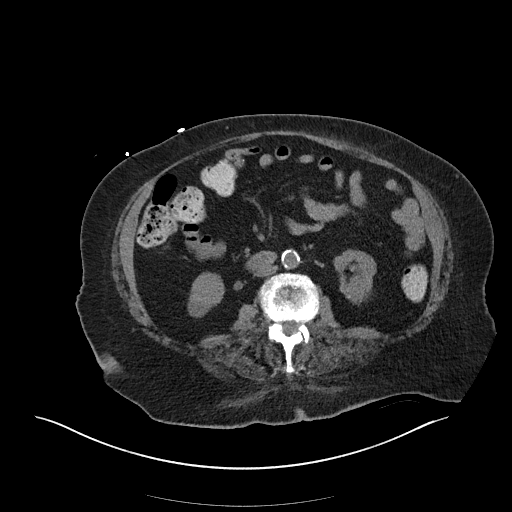
[im 64/98  soft-tissue]
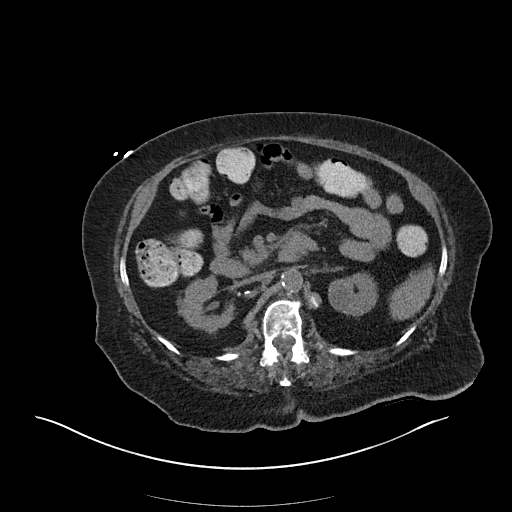
[im 64/98  bone]
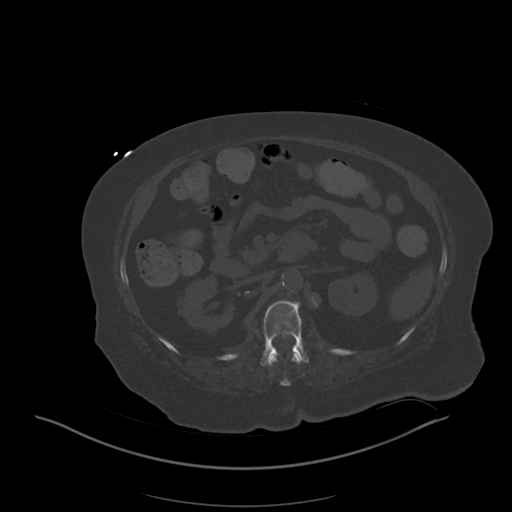
[im 72/98  soft-tissue]
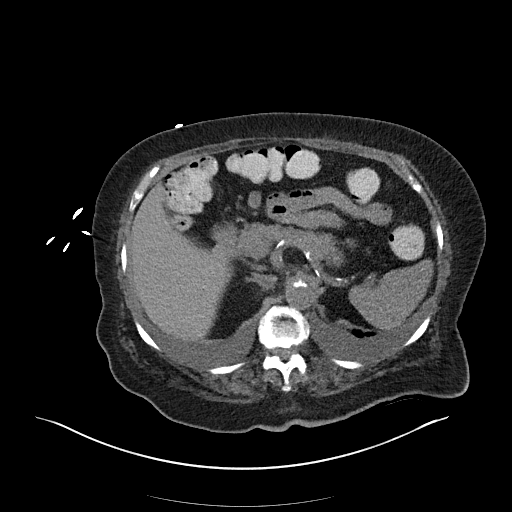
[im 76/98  soft-tissue]
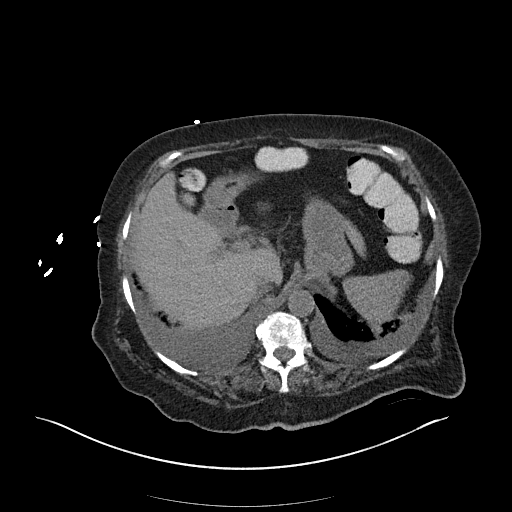
[im 85/98  soft-tissue]
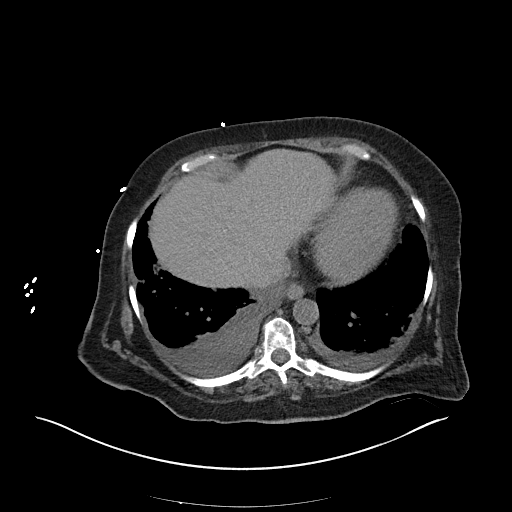
[im 93/98  soft-tissue]
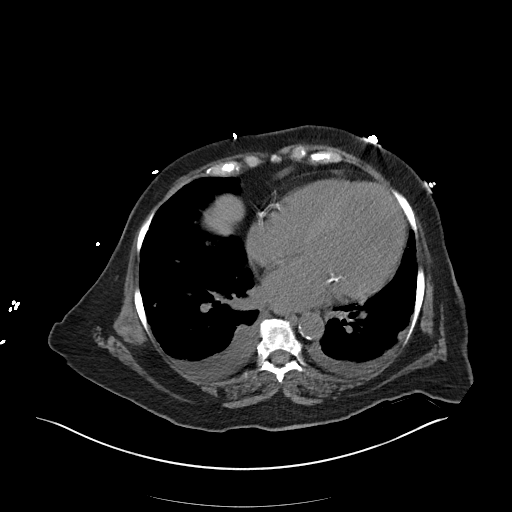

[Series 6: cor st · coronal · 0.85mm/px · 3 of 111 slices shown]
[im 37/111  soft-tissue]
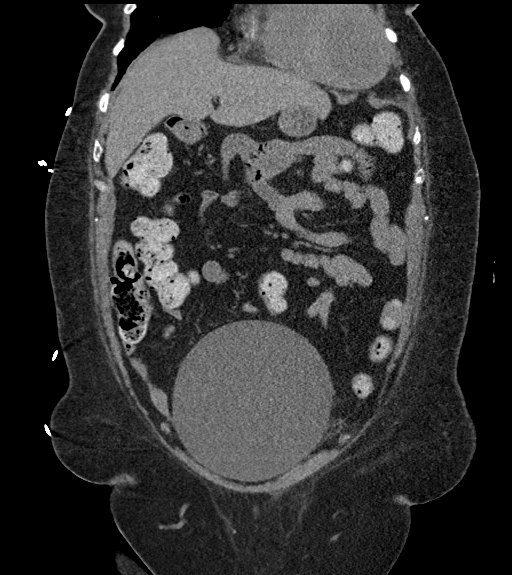
[im 49/111  soft-tissue]
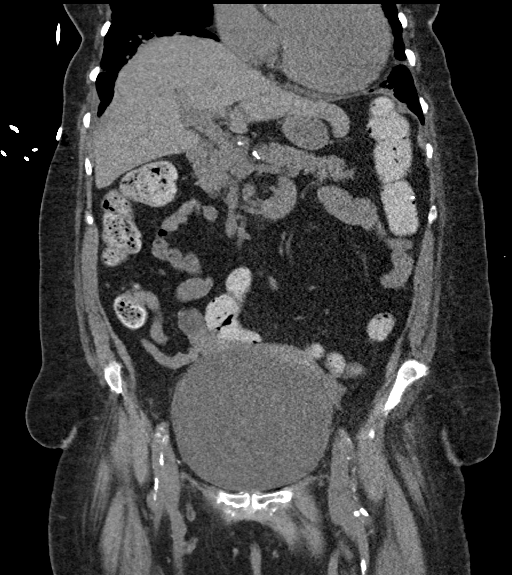
[im 62/111  soft-tissue]
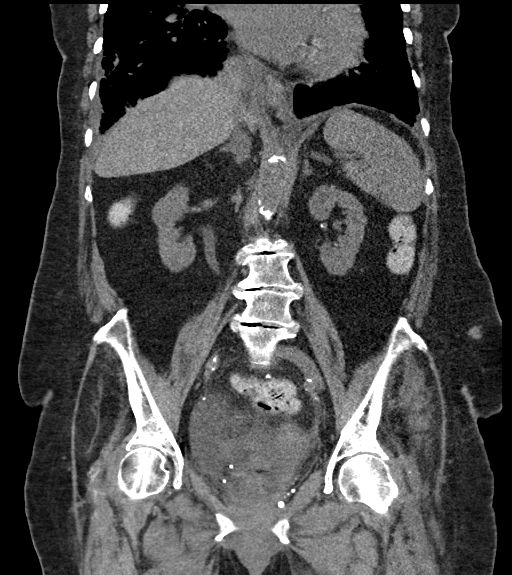

[16 of 46 positions shown; findings below may reference images not displayed]

FINDINGS: Lower chest: Bilateral effusions layering dependently. Cardiomegaly.
Abnormal patchy pulmonary markings that could be a combination of
chronic and acute lung disease, including the possibility acute
edema and pneumonia.

Hepatobiliary: Previous cholecystectomy. No focal liver parenchymal
lesion.

Pancreas: Normal

Spleen: Normal

Adrenals/Urinary Tract: Adrenal glands are normal. Kidneys are
small. Small low-density areas in both kidneys, indeterminate by CT.
Cysts shown by recent ultrasound.

Stomach/Bowel: Stomach and small intestine are normal. No acute
colon pathology. Fecal impaction in the rectum.

Vascular/Lymphatic: Aortic atherosclerotic calcification. No
aneurysm. IVC is normal. No adenopathy.

Reproductive: No pelvic mass.

Other: No free fluid or air.

Musculoskeletal: No evidence of hernia. Chronic degenerative changes
throughout the lumbar spine. Old minor superior endplate fracture at
T11.
IMPRESSION: 1. Fecal impaction in the rectum which could be symptomatic.
2. Bilateral pleural effusions layering dependently. Abnormal patchy
pulmonary markings that could be a combination of chronic and acute
lung disease, including the possibility of acute edema and patchy
pneumonia.
3. Aortic atherosclerosis.
4. Small kidneys.

Aortic Atherosclerosis ([id number]-JJ9.9).

## 2021-07-21 ENCOUNTER — Ambulatory Visit: Payer: Medicare Other

## 2021-07-27 ENCOUNTER — Telehealth: Payer: Medicare Other
